# Patient Record
Sex: Female | Born: 1990
Health system: Southern US, Community
[De-identification: ages and names within clinical notes are randomized; demographics above are authoritative.]

## PROBLEM LIST (undated history)

## (undated) ENCOUNTER — Inpatient Hospital Stay (HOSPITAL_COMMUNITY): Payer: Self-pay

## (undated) DIAGNOSIS — A5901 Trichomonal vulvovaginitis: Secondary | ICD-10-CM

## (undated) DIAGNOSIS — O23599 Infection of other part of genital tract in pregnancy, unspecified trimester: Secondary | ICD-10-CM

## (undated) DIAGNOSIS — D649 Anemia, unspecified: Secondary | ICD-10-CM

## (undated) HISTORY — PX: WISDOM TOOTH EXTRACTION: SHX21

## (undated) HISTORY — PX: NO PAST SURGERIES: SHX2092

---

## 2012-01-06 ENCOUNTER — Encounter: Payer: Self-pay | Admitting: Family

## 2012-01-06 ENCOUNTER — Ambulatory Visit (INDEPENDENT_AMBULATORY_CARE_PROVIDER_SITE_OTHER): Payer: 59 | Admitting: Family

## 2012-01-06 ENCOUNTER — Other Ambulatory Visit (HOSPITAL_COMMUNITY)
Admission: RE | Admit: 2012-01-06 | Discharge: 2012-01-06 | Disposition: A | Discharge status: Home / Self Care | Payer: 59 | Source: Ambulatory Visit | Attending: Family | Admitting: Family

## 2012-01-06 VITALS — Ht 62.75 in | Wt 117.0 lb

## 2012-01-06 DIAGNOSIS — N920 Excessive and frequent menstruation with regular cycle: Secondary | ICD-10-CM

## 2012-01-06 DIAGNOSIS — Z3009 Encounter for other general counseling and advice on contraception: Secondary | ICD-10-CM

## 2012-01-06 DIAGNOSIS — Z01419 Encounter for gynecological examination (general) (routine) without abnormal findings: Secondary | ICD-10-CM | POA: Insufficient documentation

## 2012-01-06 DIAGNOSIS — Z124 Encounter for screening for malignant neoplasm of cervix: Secondary | ICD-10-CM

## 2012-01-06 LAB — CBC
HCT: 33.7 % — ABNORMAL LOW (ref 36.0–46.0)
Hemoglobin: 11 g/dL — ABNORMAL LOW (ref 12.0–15.0)
RBC: 4.3 Mil/uL (ref 3.87–5.11)
WBC: 5 10*3/uL (ref 4.5–10.5)

## 2012-01-06 LAB — POCT URINE PREGNANCY: Preg Test, Ur: NEGATIVE

## 2012-01-06 LAB — TSH: TSH: 1.54 u[IU]/mL (ref 0.35–5.50)

## 2012-01-06 MED ORDER — LEVONORGESTREL-ETHINYL ESTRAD 0.15-30 MG-MCG PO TABS: 1.0000 | ORAL_TABLET | Freq: Every day | ORAL | Status: DC

## 2012-01-06 NOTE — Patient Instructions (Addendum)
Oral Contraception Information Oral contraceptives (OCs) are medicines taken to prevent pregnancy. OCs work by preventing the ovaries from releasing eggs. The hormones in OCs also cause the cervical mucus to thicken, preventing the sperm from entering the uterus. The hormones also cause the uterine lining to become thin, not allowing a fertilized egg to attach to the inside of the uterus. OCs are highly effective when taken exactly as prescribed. However, OCs do not prevent sexually transmitted diseases (STDs). Safe sex practices, such as using condoms along with the pill, can help prevent STDs.  Before taking the pill, you may have a physical exam and Pap test. Your caregiver may order blood tests that may be necessary. Your caregiver will make sure you are a good candidate for oral contraception. Discuss with your caregiver the possible side effects of the OC you may be prescribed. When starting an OC, it can take 2 to 3 months for the body to adjust to the changes in hormone levels in your body.  TYPES OF ORAL CONTRACEPTION  The combination pill. This pill contains estrogen and progestin (synthetic progesterone) hormones. The combination pill comes in either 21-day or 28-day packs. With 21-day packs, you do not take pills for 7 days after the last pill. With 28-day packs, the pill is taken every day. The last 7 pills are without hormones. Certain types of pills have more than 21 hormone-containing pills.   The minipill. This pill contains the progesterone hormone only. It is taken every day continuously. The minipill comes in packs of 91 pills. The first 84 pills contain the hormones, and the last 7 pills do not. The last 7 days are when you will have your menstrual period. You may experience irregular spotting.  ADVANTAGES  Decreases premenstrual symptoms.   Treats menstrual period cramps.   Regulates the menstrual cycle.   Decreases a heavy menstrual flow.   Treats acne.   Treats abnormal  uterine bleeding.   Treats chronic pelvic pain.   Treats polycystic ovarian syndrome.   Treats endometriosis.   Can be used as emergency contraception.  DISADVANTAGES OCs can be less effective if:  You forget to take the pill at the same time every day.   You have a stomach or intestinal disease that lessens the absorption of the pill.   You take OCs with other medicines that make OCs less effective.   You take expired OCs.   You forget to restart the pill on day 7, when using the packs of 21 pills.  Document Released: 12/26/2002 Document Revised: 09/24/2011 Document Reviewed: 02/11/2011 [facility] Patient Information 2012 [place], [place].  Menorrhagia Dysfunctional uterine bleeding is different from a normal menstrual period. When periods are heavy or there is more bleeding than is usual for you, it is called menorrhagia. It may be caused by hormonal imbalance, or physical, metabolic, or other problems. Examination is necessary in order that your caregiver may treat treatable causes. If this is a continuing problem, a D&C may be needed. That means that the cervix (the opening of the uterus or womb) is dilated (stretched larger) and the lining of the uterus is scraped out. The tissue scraped out is then examined under a microscope by a specialist (pathologist) to make sure there is nothing of concern that needs further or more extensive treatment. HOME CARE INSTRUCTIONS   If medications were prescribed, take exactly as directed. Do not change or switch medications without consulting your caregiver.   Long term heavy bleeding may result in iron  deficiency. Your caregiver may have prescribed iron pills. They help replace the iron your body lost from heavy bleeding. Take exactly as directed. Iron may cause constipation. If this becomes a problem, increase the bran, fruits, and roughage in your diet.   Do not take aspirin or medicines that contain aspirin one week before or during your  menstrual period. Aspirin may make the bleeding worse.   If you need to change your sanitary pad or tampon more than once every 2 hours, stay in bed and rest as much as possible until the bleeding stops.   Eat well-balanced meals. Eat foods high in iron. Examples are leafy green vegetables, meat, liver, eggs, and whole grain breads and cereals. Do not try to lose weight until the abnormal bleeding has stopped and your blood iron level is back to normal.  SEEK MEDICAL CARE IF:   You need to change your sanitary pad or tampon more than once an hour.   You develop nausea (feeling sick to your stomach) and vomiting, dizziness, or diarrhea while you are taking your medicine.   You have any problems that may be related to the medicine you are taking.  SEEK IMMEDIATE MEDICAL CARE IF:   You have a fever.   You develop chills.   You develop severe bleeding or start to pass blood clots.   You feel dizzy or faint.  MAKE SURE YOU:   Understand these instructions.   Will watch your condition.   Will get help right away if you are not doing well or get worse.  Document Released: 10/05/2005 Document Revised: 09/24/2011 Document Reviewed: 05/25/2008 [facility] Patient Information 2012 [place], [place].

## 2012-01-06 NOTE — Progress Notes (Signed)
  Subjective:    Patient ID: Courtney [name], female    DOB: July 28, 1991, 21 y.o.   MRN: [id number]  HPI 21 year old [place] American female, new patient to the practice is in to discuss birth control options. She has concerns of having a menstrual cycle recently. Her menstrual cycle lasts about 7 days, she usually uses 2 super plus absorbency pads. Does report clotting. She is sexually active with one female partner. Unprotected. Denies any concern of infection transmitted diseases. Is not sure of pregnancy. Patient denies any vaginal discharge for over, frequency urgency to urinate, abdominal pain or back pain. She has never had a Pap smear.   Review of Systems  Constitutional: Negative.   Respiratory: Negative.   Cardiovascular: Negative.   Gastrointestinal: Negative.   Genitourinary: Positive for vaginal bleeding. Negative for dysuria, vaginal discharge and vaginal pain.  Musculoskeletal: Negative.   Skin: Negative.   Psychiatric/Behavioral: Negative.    Past Medical History  Diagnosis Date  . Migraine     History   Social History  . Marital Status: Unknown    Spouse Name: N/A    Number of Children: N/A  . Years of Education: N/A   Occupational History  . Not on file.   Social History Main Topics  . Smoking status: Never Smoker   . Smokeless tobacco: Not on file  . Alcohol Use: No  . Drug Use: No  . Sexually Active: Not on file   Other Topics Concern  . Not on file   Social History Narrative  . No narrative on file    No past surgical history on file.  Family History  Problem Relation Age of Onset  . Diabetes Mother   . Kidney disease Father   . Diabetes Maternal Uncle   . Diabetes Maternal Grandmother   . Cancer Paternal Grandfather     lung    No Known Allergies  No current outpatient prescriptions on file prior to visit.    Ht 5' 2.75" (1.594 m)  Wt 117 lb (53.071 kg)  BMI 20.89 kg/m2  LMP 03/15/2013chart    Objective:   Physical Exam    Constitutional: She is oriented to person, place, and time. She appears well-developed and well-nourished.  Neck: Normal range of motion. Neck supple. No thyromegaly present.  Cardiovascular: Normal rate, regular rhythm and normal heart sounds.   Pulmonary/Chest: Effort normal and breath sounds normal.  Abdominal: Soft.  Genitourinary: Vagina normal and uterus normal.       Patient is on her menses., Vaginal smell. No cervical motion tenderness. No adnexal tenderness.  Musculoskeletal: Normal range of motion.  Neurological: She is alert and oriented to person, place, and time.  Skin: Skin is warm and dry.  Psychiatric: She has a normal mood and affect.          Assessment & Plan:  Assessment: Menorrhagia, Pap smear, counseling of birth control  Plan: Pap smear sent. Will start Nordette oral contraceptive pills once daily. Labs sent to include CBC and TSH. Encouraged healthy diet and exercise. Safe sex practices. We'll follow the patient on the results of her labs, in one year, and when necessary sooner.

## 2012-10-04 ENCOUNTER — Encounter: Payer: Self-pay | Admitting: Family

## 2012-10-04 ENCOUNTER — Ambulatory Visit (INDEPENDENT_AMBULATORY_CARE_PROVIDER_SITE_OTHER): Payer: 59 | Admitting: Family

## 2012-10-04 VITALS — BP 132/100 | HR 108 | Temp 98.3°F | Wt 120.0 lb

## 2012-10-04 DIAGNOSIS — IMO0001 Reserved for inherently not codable concepts without codable children: Secondary | ICD-10-CM

## 2012-10-04 DIAGNOSIS — R030 Elevated blood-pressure reading, without diagnosis of hypertension: Secondary | ICD-10-CM

## 2012-10-04 DIAGNOSIS — R51 Headache: Secondary | ICD-10-CM

## 2012-10-04 MED ORDER — AMITRIPTYLINE HCL 10 MG PO TABS: 10.0000 mg | ORAL_TABLET | Freq: Every day | ORAL | Status: DC

## 2012-10-04 NOTE — Patient Instructions (Addendum)

## 2012-10-04 NOTE — Progress Notes (Signed)
Subjective:    Patient ID: Courtney [name], female    DOB: [birth year], 21 y.o.   MRN: [id number]  HPI 21 year old [place] American female, no nonsmoker is in with complaints of headaches that are occurring nearly every day x1 month. They respond well to ibuprofen. Headaches are typically at the crown of her hay it, described as dull ache. She reports an increase in stress related to working in school. She is also planning a wedding. Patient has a history of headaches in the past dating back to high school. But has not had any recent headaches. Patient also started birth control 4 months ago. Denies any nausea, vomiting, blurred vision, double vision. She's also concerned that her blood pressure is elevated today. She has a family history of hypertension and her father and paternal grandmother.  Review of Systems  Constitutional: Negative.   Respiratory: Negative.   Cardiovascular: Negative.   Gastrointestinal: Negative.  Negative for nausea and vomiting.  Musculoskeletal: Negative.   Skin: Negative.   Neurological: Positive for headaches. Negative for light-headedness.  Hematological: Negative.   Psychiatric/Behavioral: Negative.    Past Medical History  Diagnosis Date  . Migraine     History   Social History  . Marital Status: Unknown    Spouse Name: N/A    Number of Children: N/A  . Years of Education: N/A   Occupational History  . Not on file.   Social History Main Topics  . Smoking status: Never Smoker   . Smokeless tobacco: Not on file  . Alcohol Use: No  . Drug Use: No  . Sexually Active: Not on file   Other Topics Concern  . Not on file   Social History Narrative  . No narrative on file    No past surgical history on file.  Family History  Problem Relation Age of Onset  . Diabetes Mother   . Kidney disease Father   . Diabetes Maternal Uncle   . Diabetes Maternal Grandmother   . Cancer Paternal Grandfather     lung    No Known  Allergies  Current Outpatient Prescriptions on File Prior to Visit  Medication Sig Dispense Refill  . levonorgestrel-ethinyl estradiol (NORDETTE) 0.15-30 MG-MCG tablet Take 1 tablet by mouth daily.  1 Package  11  . amitriptyline (ELAVIL) 10 MG tablet Take 1 tablet (10 mg total) by mouth at bedtime.  30 tablet  1    BP 132/100  Pulse 108  Temp 98.3 F (36.8 C) (Oral)  Wt 120 lb (54.432 kg)  SpO2 99%chart    Objective:   Physical Exam  Constitutional: She is oriented to person, place, and time. She appears well-developed and well-nourished.  HENT:  Right Ear: External ear normal.  Left Ear: External ear normal.  Nose: Nose normal.  Mouth/Throat: Oropharynx is clear and moist.  Neck: Normal range of motion. Neck supple.  Cardiovascular: Normal rate, regular rhythm and normal heart sounds.   Pulmonary/Chest: Effort normal and breath sounds normal.  Abdominal: Soft. Bowel sounds are normal.  Musculoskeletal: Normal range of motion.  Neurological: She is alert and oriented to person, place, and time.  Skin: Skin is warm and dry.  Psychiatric: She has a normal mood and affect.          Assessment & Plan:  Assessment: Headache, elevated blood pressure, acute stress reaction  Plan: Start amitriptyline 10 mg once daily at bedtime to help target stress and headaches. Monitor blood pressure at home and bring readings in to her  next office visit. Patient call the office if symptoms worsen or persist. Recheck as scheduled and as needed.

## 2012-10-07 ENCOUNTER — Telehealth: Payer: Self-pay | Admitting: Family
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
## 2012-12-07 ENCOUNTER — Other Ambulatory Visit: Payer: Self-pay

## 2012-12-07 MED ORDER — LEVONORGESTREL-ETHINYL ESTRAD 0.15-30 MG-MCG PO TABS: 1.0000 | ORAL_TABLET | Freq: Every day | ORAL | Status: DC

## 2013-06-01 ENCOUNTER — Encounter (HOSPITAL_COMMUNITY): Payer: Self-pay | Admitting: *Deleted

## 2013-06-01 ENCOUNTER — Inpatient Hospital Stay (HOSPITAL_COMMUNITY)
Admission: AD | Admit: 2013-06-01 | Discharge: 2013-06-01 | Disposition: A | Discharge status: Home / Self Care | Payer: 59 | Source: Ambulatory Visit | Attending: Family Medicine | Admitting: Family Medicine

## 2013-06-01 DIAGNOSIS — N926 Irregular menstruation, unspecified: Secondary | ICD-10-CM

## 2013-06-01 DIAGNOSIS — IMO0001 Reserved for inherently not codable concepts without codable children: Secondary | ICD-10-CM

## 2013-06-01 DIAGNOSIS — R030 Elevated blood-pressure reading, without diagnosis of hypertension: Secondary | ICD-10-CM | POA: Insufficient documentation

## 2013-06-01 DIAGNOSIS — N912 Amenorrhea, unspecified: Secondary | ICD-10-CM | POA: Insufficient documentation

## 2013-06-01 DIAGNOSIS — Z3202 Encounter for pregnancy test, result negative: Secondary | ICD-10-CM

## 2013-06-01 LAB — POCT PREGNANCY, URINE: Preg Test, Ur: NEGATIVE

## 2013-06-01 LAB — URINALYSIS, ROUTINE W REFLEX MICROSCOPIC
Bilirubin Urine: NEGATIVE
Leukocytes, UA: NEGATIVE
Nitrite: NEGATIVE
Specific Gravity, Urine: >1.03 — ABNORMAL HIGH (ref 1.005–1.030)
pH: 6 (ref 5.0–8.0)

## 2013-06-01 NOTE — MAU Note (Signed)
Stopped BC pills last month July 14th; LNMP was July 9th -13th; was suppose to start her period this month on the 6th, but hasn't started yet; had unprotected sex;

## 2013-06-01 NOTE — MAU Provider Note (Signed)
Chart reviewed and agree with management and plan.  

## 2013-06-01 NOTE — MAU Provider Note (Signed)
History     CSN: [id number]  Arrival date and time: 06/01/13 1702   None     Chief Complaint  Patient presents with  . Possible Pregnancy   HPI Courtney [name] is 22 y.o. No obstetric history on file. Unknown weeks presenting with with report of a late menstrual cycle.  She reports she d/c her OCPs 7/14 and had an expected,normal cycle from 7/9-7/14.  She has had unprotected intercourse.  She states she had a sharp pain at the time she discontinued OCPS, none since.  She also states that her PCP has been following her blood pressures which have elevated and instructed her to discontinue her OCPs at the end of that pack, which she did.  She denies headache, visual changes, swelling, vaginal bleeding or discharge or abdominal pain.     Past Medical History  Diagnosis Date  . Migraine     No past surgical history on file.  Family History  Problem Relation Age of Onset  . Diabetes Mother   . Kidney disease Father   . Diabetes Maternal Uncle   . Diabetes Maternal Grandmother   . Cancer Paternal Grandfather     lung    History  Substance Use Topics  . Smoking status: Never Smoker   . Smokeless tobacco: Not on file  . Alcohol Use: No    Allergies: No Known Allergies  Prescriptions prior to admission  Medication Sig Dispense Refill  . amitriptyline (ELAVIL) 10 MG tablet Take 1 tablet (10 mg total) by mouth at bedtime.  30 tablet  1  . levonorgestrel-ethinyl estradiol (NORDETTE) 0.15-30 MG-MCG tablet Take 1 tablet by mouth daily.  1 Package  11    Review of Systems  Constitutional: Negative for fever and chills.  Eyes: Negative for blurred vision and double vision.  Cardiovascular: Negative for chest pain.  Gastrointestinal: Negative for abdominal pain.  Genitourinary: Negative for dysuria, urgency and frequency.       Neg for vaginal bleeding or discharge  Neurological: Negative for headaches.   Physical Exam   Blood pressure 161/93, pulse 104, temperature  98.7 F (37.1 C), temperature source Oral, resp. rate 16, height 5\' 2"  (1.575 m), weight 117 lb (53.071 kg).  Physical Exam  Nursing note and vitals reviewed. Constitutional: She is oriented to person, place, and time. She appears well-developed and well-nourished. No distress.  HENT:  Head: Normocephalic.  Respiratory: Effort normal.  Genitourinary:  Not indicated  Neurological: She is alert and oriented to person, place, and time.  Psychiatric: She has a normal mood and affect. Her behavior is normal.      Patient Vitals for the past 24 hrs:  BP Temp Temp src Pulse Resp Height Weight  06/01/13 1807 138/84 mmHg - - 98 - - -  06/01/13 1723 161/93 mmHg 98.7 F (37.1 C) Oral 104 16 5\' 2"  (1.575 m) 117 lb (53.071 kg)   MAU Course  Procedures  MDM Discussed with the patient and her Mother, that delay in menstrual cycle is not uncommon first month off pills.  She is now a week late, had negative UPT here.  Explained that I am concerned about her blood pressure reading.  Without any sxs associated with elevated blood pressure and the fact that the last reading was WNL, she may be discharge to home with follow up with her PCP tomorrow.  She was instructed to use condoms until another form of contraception has been chosen.  Assessment and Plan  A:  Missed  cycle      Negative pregnancy test      Elevated blood pressure  P:Follow up with PCP tomorrow for re-evaluation of blood pressure    Condoms until another method is chose        Shalen Petrak,EVE M 06/01/2013, 5:25 PM

## 2013-09-25 ENCOUNTER — Encounter: Payer: Self-pay | Admitting: Family

## 2013-09-25 ENCOUNTER — Ambulatory Visit (INDEPENDENT_AMBULATORY_CARE_PROVIDER_SITE_OTHER): Payer: 59 | Admitting: Family

## 2013-09-25 VITALS — BP 124/88 | HR 87 | Wt 121.0 lb

## 2013-09-25 DIAGNOSIS — R8290 Unspecified abnormal findings in urine: Secondary | ICD-10-CM

## 2013-09-25 DIAGNOSIS — R82998 Other abnormal findings in urine: Secondary | ICD-10-CM

## 2013-09-25 DIAGNOSIS — N912 Amenorrhea, unspecified: Secondary | ICD-10-CM

## 2013-09-25 LAB — POCT URINALYSIS DIPSTICK
Leukocytes, UA: NEGATIVE
Nitrite, UA: NEGATIVE
Protein, UA: NEGATIVE
Urobilinogen, UA: NEGATIVE

## 2013-09-25 LAB — POCT URINE PREGNANCY: Preg Test, Ur: NEGATIVE

## 2013-09-25 LAB — CBC WITH DIFFERENTIAL/PLATELET
Basophils Absolute: 0 10*3/uL (ref 0.0–0.1)
Eosinophils Absolute: 0.1 10*3/uL (ref 0.0–0.7)
Lymphocytes Relative: 28.8 % (ref 12.0–46.0)
MCHC: 32.7 g/dL (ref 30.0–36.0)
Monocytes Absolute: 0.4 10*3/uL (ref 0.1–1.0)
Neutro Abs: 2.9 10*3/uL (ref 1.4–7.7)
Neutrophils Relative %: 58.5 % (ref 43.0–77.0)
RDW: 20.2 % — ABNORMAL HIGH (ref 11.5–14.6)

## 2013-09-25 NOTE — Patient Instructions (Signed)
Primary Amenorrhea  Primary amenorrhea is the absence of any menstrual flow in a woman by the age of sixteen. An average age for the start of menstruation is age twelve. Primary amenorrhea is not considered to have occurred until a girl is over age 21 and has never menstruated. This may occur with or without other signs of puberty. CAUSES  Some common causes of not menstruating include:  Malnutrition.  Low blood sugar (hypoglycemia).  Polycystic ovarian disease (cysts in the ovaries, not ovulating).  Absence of the vagina, uterus, or ovaries since birth (congenital).  Extreme obesity.  Cystic fibrosis.  Drastic weight loss from any cause.  Over-exercising (running, biking) causing loss of body fat.  Pituitary gland tumor in the brain.  Long-term (chronic) illnesses.  Cushing's disease.  Thyroid disease (hypothyroidism, hyperthyroidism).  Part of the brain (hypothalamus) not functioning.  Premature ovarian failure. DIAGNOSIS  This diagnosis is made by doing a medical history and physical exam. Often, numerous blood tests of different hormones in the body may be done, along with some urine testing. Specialized x-rays may need to be done, as well as measuring the Body Mass Index (BMI). If your BMI is less than 20, the hypothalamus may be the problem. If it is greater than 30, the cause may be diabetes mellitus. Pregnancy must be ruled out. TREATMENT  Treatment depends on the cause of the amenorrhea. If an eating disorder is present, this can be treated with an adequate diet and therapy. Chronic illnesses may improve with treatment of the illness. Overall, the outlook is good, and amenorrhea may be corrected with medicines, lifestyle changes, or surgery. If the amenorrhea can not be corrected, it is sometimes possible to create a false menstruation with medicines, to help young women feel more normal. Should emotional problems occur, your caregiver will be able to help you.  Document  Released: 10/05/2005 Document Revised: 12/28/2011 Document Reviewed: 05/23/2008 [employer] Patient Information 2014 [employer].  

## 2013-09-25 NOTE — Progress Notes (Signed)
Pre visit review using our clinic review tool, if applicable. No additional management support is needed unless otherwise documented below in the visit note. 

## 2013-09-25 NOTE — Progress Notes (Signed)
   Subjective:    Patient ID: Courtney [name], female    DOB: 09/27/1991, 22 y.o.   MRN: [id number]  HPI  22 year old [place] female, nonsmoker presenting with c/o no menstrual cycle since October 29.  Reports she typically has a 29 day cycle. She has increased stress with an upcoming wedding in 3 months. She is sexually active.  She stopped taking birth control in August. Complains of increased anxiety due to [place] in March.    Review of Systems  Constitutional: Negative.   Respiratory: Negative.   Cardiovascular: Negative.   Gastrointestinal: Negative.   Endocrine: Negative.   Genitourinary: Positive for menstrual problem. Negative for vaginal bleeding, vaginal discharge and vaginal pain.       Patient complains of no menstrual cycle since October 29.  Musculoskeletal: Negative.   Skin: Negative.   Neurological: Negative.   Hematological: Negative.   Psychiatric/Behavioral: Negative.   All other systems reviewed and are negative.   Past Medical History  Diagnosis Date  . Migraine   . Hypertension     History   Social History  . Marital Status: Single    Spouse Name: N/A    Number of Children: N/A  . Years of Education: N/A   Occupational History  . Not on file.   Social History Main Topics  . Smoking status: Never Smoker   . Smokeless tobacco: Not on file  . Alcohol Use: No  . Drug Use: No  . Sexual Activity: Not on file   Other Topics Concern  . Not on file   Social History Narrative  . No narrative on file    History reviewed. No pertinent past surgical history.  Family History  Problem Relation Age of Onset  . Diabetes Mother   . Kidney disease Father   . Diabetes Maternal Uncle   . Diabetes Maternal Grandmother   . Cancer Paternal Grandfather     lung    No Known Allergies  No current outpatient prescriptions on file prior to visit.   No current facility-administered medications on file prior to visit.    BP 124/88  Pulse 87  Wt 121  lb (54.885 kg)chart    Objective:   Physical Exam  Constitutional: She is oriented to person, place, and time. She appears well-developed and well-nourished.  Neck: Normal range of motion. Neck supple.  Cardiovascular: Normal rate, regular rhythm and normal heart sounds.   Pulmonary/Chest: Effort normal and breath sounds normal.  Abdominal: Soft. Bowel sounds are normal.  Musculoskeletal: Normal range of motion.  Neurological: She is alert and oriented to person, place, and time.  Skin: Skin is warm and dry.  Psychiatric: She has a normal mood and affect.   Urine pregnancy test obtained with negative results.       Assessment & Plan:  Assessment: 1. Amenorrhea  Plan: Check TSH and serum HCG.   Call office if problems persist or worsen. We'll followup pending the results of her labs and sooner as needed.

## 2013-11-21 ENCOUNTER — Encounter (HOSPITAL_COMMUNITY): Payer: Self-pay | Admitting: Emergency Medicine

## 2013-11-21 DIAGNOSIS — R51 Headache: Secondary | ICD-10-CM | POA: Insufficient documentation

## 2013-11-21 DIAGNOSIS — Z3202 Encounter for pregnancy test, result negative: Secondary | ICD-10-CM | POA: Insufficient documentation

## 2013-11-21 DIAGNOSIS — H538 Other visual disturbances: Secondary | ICD-10-CM | POA: Insufficient documentation

## 2013-11-21 DIAGNOSIS — I10 Essential (primary) hypertension: Secondary | ICD-10-CM | POA: Insufficient documentation

## 2013-11-21 NOTE — ED Notes (Signed)
Pt. reports hypertension this evening at home 134/101 with slight blurred vision .Respirations unlabored . Denies pain .

## 2013-11-22 ENCOUNTER — Emergency Department (HOSPITAL_COMMUNITY)
Admission: EM | Admit: 2013-11-22 | Discharge: 2013-11-22 | Disposition: A | Discharge status: Home / Self Care | Payer: Medicaid Other | Attending: Emergency Medicine | Admitting: Emergency Medicine

## 2013-11-22 DIAGNOSIS — I10 Essential (primary) hypertension: Secondary | ICD-10-CM

## 2013-11-22 LAB — POCT PREGNANCY, URINE: Preg Test, Ur: NEGATIVE

## 2013-11-22 MED ORDER — IBUPROFEN 400 MG PO TABS: 400.0000 mg | ORAL_TABLET | Freq: Four times a day (QID) | ORAL | Status: DC | PRN

## 2013-11-22 MED ORDER — HYDROCHLOROTHIAZIDE 12.5 MG PO TABS: 12.5000 mg | ORAL_TABLET | Freq: Every day | ORAL | Status: DC

## 2013-11-22 NOTE — ED Provider Notes (Signed)
CSN: [id number]     Arrival date & time 11/21/13  2328 History   First MD Initiated Contact with Patient 11/22/13 0002     Chief Complaint  Patient presents with  . Hypertension   (Consider location/radiation/quality/duration/timing/severity/associated sxs/prior Treatment) HPI Comments: 23 y/o F comes in with cc of HTN. Pt has no forma dx of HTN, and is not on any meds. She however, has had headaches, and blurry vision in the past - which has correlated to elevated blood pressure - and so today, when she had some blurry vision, she checked her BP, it was 130/100 - and she came to the ER for further evaluation. The blurry vision is on the left eye, and it has nearly resolved. 1 prior episode. No associated headaches, numbness, tingling, weakness, gait instability. Pt denies smoking increased stimulant use, substance abuse. She is planning her wedding, and is in a bit more stress than usual. No fam hx of MS, early strokes.  Patient is a 23 y.o. female presenting with hypertension. The history is provided by the patient.  Hypertension Pertinent negatives include no chest pain, no abdominal pain, no headaches and no shortness of breath.    Past Medical History  Diagnosis Date  . Migraine   . Hypertension    History reviewed. No pertinent past surgical history. Family History  Problem Relation Age of Onset  . Diabetes Mother   . Kidney disease Father   . Diabetes Maternal Uncle   . Diabetes Maternal Grandmother   . Cancer Paternal Grandfather     lung   History  Substance Use Topics  . Smoking status: Never Smoker   . Smokeless tobacco: Not on file  . Alcohol Use: No   OB History   Grav Para Term Preterm Abortions TAB SAB Ect Mult Living   0              Review of Systems  Constitutional: Negative for activity change.  Eyes: Positive for visual disturbance.  Respiratory: Negative for shortness of breath.   Cardiovascular: Negative for chest pain.  Gastrointestinal: Negative  for nausea and abdominal pain.  Neurological: Negative for weakness, light-headedness, numbness and headaches.    Allergies  Review of patient's allergies indicates no known allergies.  Home Medications  No current outpatient prescriptions on file. BP 153/101  Pulse 95  Temp(Src) 97.7 F (36.5 C) (Oral)  Resp 16  Wt 124 lb 2 oz (56.303 kg)  SpO2 100% Physical Exam  Nursing note and vitals reviewed. Constitutional: She is oriented to person, place, and time. She appears well-developed and well-nourished.  HENT:  Head: Normocephalic and atraumatic.  Eyes: EOM are normal. Pupils are equal, round, and reactive to light.  Neck: Neck supple. No JVD present.  Cardiovascular: Normal rate, regular rhythm and normal heart sounds.   No murmur heard. Pulmonary/Chest: Effort normal. No respiratory distress.  Abdominal: Soft. She exhibits no distension. There is no tenderness. There is no rebound and no guarding.  Neurological: She is alert and oriented to person, place, and time.  Skin: Skin is warm and dry.    ED Course  Procedures (including critical care time) Labs Review Labs Reviewed  POCT PREGNANCY, URINE   Imaging Review No results found.  EKG Interpretation   None       MDM  No diagnosis found.  Pt comes in with cc of elevated BP. Her PCP is actually monitoring the BP, and patient states that she has had 1/2 elevated readings at the  clinic. Her home BP is nor concerning at all - however, triage BP is elevated. She is asymptomatic right now. Pt has no true risk factors for stroke, and we dint think this was a TIA. Low suspicion for MS as well - asked her to see an optometrist. Repeat BP ordered.  I think it will likely be more harmful than beneficial to start pt on antihypertensive from the ED. Will request continued pcp f/u - and i have asked her to have her BP checked randomly, after reting for few minutes to keep a log.  Derwood Kaplan, MD 11/22/13 [phone]

## 2013-11-22 NOTE — ED Notes (Signed)
Pt states that for the past several months she has been having episodes of Hypertensions was seen at a clinic several times and her BP was elevated. Pt states HA for the past 3 days and blurred vision but denies HA or blurred vision now. Pt was concerned because her BP has not been running this high for this long before. Pt alert and oriented no neuro deficits.

## 2013-11-22 NOTE — Discharge Instructions (Signed)
Arterial Hypertension °Arterial hypertension (high blood pressure) is a condition of elevated pressure in your blood vessels. Hypertension over a long period of time is a risk factor for strokes, heart attacks, and heart failure. It is also the leading cause of kidney (renal) failure.  °CAUSES  °· In Adults -- Over 90% of all hypertension has no known cause. This is called essential or primary hypertension. In the other 10% of people with hypertension, the increase in blood pressure is caused by another disorder. This is called secondary hypertension. Important causes of secondary hypertension are: °· Heavy alcohol use. °· Obstructive sleep apnea. °· Hyperaldosterosim (Conn's syndrome). °· Steroid use. °· Chronic kidney failure. °· Hyperparathyroidism. °· Medications. °· Renal artery stenosis. °· Pheochromocytoma. °· Cushing's disease. °· Coarctation of the aorta. °· Scleroderma renal crisis. °· Licorice (in excessive amounts). °· Drugs (cocaine, methamphetamine). °Your caregiver can explain any items above that apply to you. °· In Children -- Secondary hypertension is more common and should always be considered. °· Pregnancy -- Few women of childbearing age have high blood pressure. However, up to 10% of them develop hypertension of pregnancy. Generally, this will not harm the woman. It may be a sign of 3 complications of pregnancy: preeclampsia, HELLP syndrome, and eclampsia. Follow up and control with medication is necessary. °SYMPTOMS  °· This condition normally does not produce any noticeable symptoms. It is usually found during a routine exam. °· Malignant hypertension is a late problem of high blood pressure. It may have the following symptoms: °· Headaches. °· Blurred vision. °· End-organ damage (this means your kidneys, heart, lungs, and other organs are being damaged). °· Stressful situations can increase the blood pressure. If a person with normal blood pressure has their blood pressure go up while being  seen by their caregiver, this is often termed "white coat hypertension." Its importance is not known. It may be related with eventually developing hypertension or complications of hypertension. °· Hypertension is often confused with mental tension, stress, and anxiety. °DIAGNOSIS  °The diagnosis is made by 3 separate blood pressure measurements. They are taken at least 1 week apart from each other. If there is organ damage from hypertension, the diagnosis may be made without repeat measurements. °Hypertension is usually identified by having blood pressure readings: °· Above 140/90 mmHg measured in both arms, at 3 separate times, over a couple weeks. °· Over 130/80 mmHg should be considered a risk factor and may require treatment in patients with diabetes. °Blood pressure readings over 120/80 mmHg are called "pre-hypertension" even in non-diabetic patients. °To get a true blood pressure measurement, use the following guidelines. Be aware of the factors that can alter blood pressure readings. °· Take measurements at least 1 hour after caffeine. °· Take measurements 30 minutes after smoking and without any stress. This is another reason to quit smoking  it raises your blood pressure. °· Use a proper cuff size. Ask your caregiver if you are not sure about your cuff size. °· Most home blood pressure cuffs are automatic. They will measure systolic and diastolic pressures. The systolic pressure is the pressure reading at the start of sounds. Diastolic pressure is the pressure at which the sounds disappear. If you are elderly, measure pressures in multiple postures. Try sitting, lying or standing. °· Sit at rest for a minimum of 5 minutes before taking measurements. °· You should not be on any medications like decongestants. These are found in many cold medications. °· Record your blood pressure readings and review   them with your caregiver. °If you have hypertension: °· Your caregiver may do tests to be sure you do not have  secondary hypertension (see "causes" above). °· Your caregiver may also look for signs of metabolic syndrome. This is also called Syndrome X or Insulin Resistance Syndrome. You may have this syndrome if you have type 2 diabetes, abdominal obesity, and abnormal blood lipids in addition to hypertension. °· Your caregiver will take your medical and family history and perform a physical exam. °· Diagnostic tests may include blood tests (for glucose, cholesterol, potassium, and kidney function), a urinalysis, or an EKG. Other tests may also be necessary depending on your condition. °PREVENTION  °There are important lifestyle issues that you can adopt to reduce your chance of developing hypertension: °· Maintain a normal weight. °· Limit the amount of salt (sodium) in your diet. °· Exercise often. °· Limit alcohol intake. °· Get enough potassium in your diet. Discuss specific advice with your caregiver. °· Follow a DASH diet (dietary approaches to stop hypertension). This diet is rich in fruits, vegetables, and low-fat dairy products, and avoids certain fats. °PROGNOSIS  °Essential hypertension cannot be cured. Lifestyle changes and medical treatment can lower blood pressure and reduce complications. The prognosis of secondary hypertension depends on the underlying cause. Many people whose hypertension is controlled with medicine or lifestyle changes can live a normal, healthy life.  °RISKS AND COMPLICATIONS  °While high blood pressure alone is not an illness, it often requires treatment due to its short- and long-term effects on many organs. Hypertension increases your risk for: °· CVAs or strokes (cerebrovascular accident). °· Heart failure due to chronically high blood pressure (hypertensive cardiomyopathy). °· Heart attack (myocardial infarction). °· Damage to the retina (hypertensive retinopathy). °· Kidney failure (hypertensive nephropathy). °Your caregiver can explain list items above that apply to you. Treatment  of hypertension can significantly reduce the risk of complications. °TREATMENT  °· For overweight patients, weight loss and regular exercise are recommended. Physical fitness lowers blood pressure. °· Mild hypertension is usually treated with diet and exercise. A diet rich in fruits and vegetables, fat-free dairy products, and foods low in fat and salt (sodium) can help lower blood pressure. Decreasing salt intake decreases blood pressure in a 1/3 of people. °· Stop smoking if you are a smoker. °The steps above are highly effective in reducing blood pressure. While these actions are easy to suggest, they are difficult to achieve. Most patients with moderate or severe hypertension end up requiring medications to bring their blood pressure down to a normal level. There are several classes of medications for treatment. Blood pressure pills (antihypertensives) will lower blood pressure by their different actions. Lowering the blood pressure by 10 mmHg may decrease the risk of complications by as much as 25%. °The goal of treatment is effective blood pressure control. This will reduce your risk for complications. Your caregiver will help you determine the best treatment for you according to your lifestyle. What is excellent treatment for one person, may not be for you. °HOME CARE INSTRUCTIONS  °· Do not smoke. °· Follow the lifestyle changes outlined in the "Prevention" section. °· If you are on medications, follow the directions carefully. Blood pressure medications must be taken as prescribed. Skipping doses reduces their benefit. It also puts you at risk for problems. °· Follow up with your caregiver, as directed. °· If you are asked to monitor your blood pressure at home, follow the guidelines in the "Diagnosis" section above. °SEEK MEDICAL CARE   IF:   You think you are having medication side effects.  You have recurrent headaches or lightheadedness.  You have swelling in your ankles.  You have trouble with  your vision. SEEK IMMEDIATE MEDICAL CARE IF:   You have sudden onset of chest pain or pressure, difficulty breathing, or other symptoms of a heart attack.  You have a severe headache.  You have symptoms of a stroke (such as sudden weakness, difficulty speaking, difficulty walking). MAKE SURE YOU:   Understand these instructions.  Will watch your condition.  Will get help right away if you are not doing well or get worse. Document Released: 10/05/2005 Document Revised: 12/28/2011 Document Reviewed: 05/05/2007 [facility] Patient Information 2014 [place].  How to Take Your Blood Pressure  These instructions are only for electronic home blood pressure machines. You will need:   An automatic or semi-automatic blood pressure machine.  Fresh batteries for the blood pressure machine. HOW DO I USE THESE TOOLS TO CHECK MY BLOOD PRESSURE?   There are 2 numbers that make up your blood pressure. For example: 120/80.  The first number (120 in our example) is called the "systolic pressure." It is a measure of the pressure in your blood vessels when your heart is pumping blood.  The second number (80 in our example) is called the "diastolic pressure." It is a measure of the pressure in your blood vessels when your heart is resting between beats.  Before you buy a home blood pressure machine, check the size of your arm so you can buy the right size cuff. Here is how to check the size of your arm:  Use a tape measure that shows both inches and centimeters.  Wrap the tape measure around the middle upper part of your arm. You may need someone to help you measure right.  Write down your arm measurement in both inches and centimeters.  To measure your blood pressure right, it is important to have the right size cuff.  If your arm is up to 13 inches (37 to 34 centimeters), get an adult cuff size.  If your arm is 13 to 17 inches (35 to 44 centimeters), get a large adult cuff size.  If  your arm is 17 to 20 inches (45 to 52 centimeters), get an adult thigh cuff.  Try to rest or relax for at least 30 minutes before you check your blood pressure.  Do not smoke.  Do not have any drinks with caffeine, such as:  Pop.  Coffee.  Tea.  Check your blood pressure in a quiet room.  Sit down and stretch out your arm on a table. Keep your arm at about the level of your heart. Let your arm relax. GETTING BLOOD PRESSURE READINGS  Make sure you remove any tight-fighting clothing from your arm. Wrap the cuff around your upper arm. Wrap it just above the bend, and above where you felt the pulse. You should be able to slip a finger between the cuff and your arm. If you cannot slip a finger in the cuff, it is too tight and should be removed and rewrapped.  Some units requires you to manually pump up the arm cuff.  Automatic units inflate the cuff when you press a button.  Cuff deflation is automatic in both models.  After the cuff is inflated, the unit measures your blood pressure and pulse. The readings are displayed on a monitor. Hold still and breathe normally while the cuff is inflated.  Getting a  reading takes less than a minute.  Some models store readings in a memory. Some provide a printout of readings.  Get readings at different times of the day. You should wait at least 5 minutes between readings. Take readings with you to your next doctor's visit. Document Released: 09/17/2008 Document Revised: 12/28/2011 Document Reviewed: 09/17/2008 [facility] Patient Information 2014 [place], [place].  DASH Diet The DASH diet stands for "Dietary Approaches to Stop Hypertension." It is a healthy eating plan that has been shown to reduce high blood pressure (hypertension) in as little as 14 days, while also possibly providing other significant health benefits. These other health benefits include reducing the risk of breast cancer after menopause and reducing the risk of type 2 diabetes,  heart disease, colon cancer, and stroke. Health benefits also include weight loss and slowing kidney failure in patients with chronic kidney disease.  DIET GUIDELINES  Limit salt (sodium). Your diet should contain less than 1500 mg of sodium daily.  Limit refined or processed carbohydrates. Your diet should include mostly whole grains. Desserts and added sugars should be used sparingly.  Include small amounts of heart-healthy fats. These types of fats include nuts, oils, and tub margarine. Limit saturated and trans fats. These fats have been shown to be harmful in the body. CHOOSING FOODS  The following food groups are based on a 2000 calorie diet. See your Registered Dietitian for individual calorie needs. Grains and Grain Products (6 to 8 servings daily)  Eat More Often: Whole-wheat bread, brown rice, whole-grain or wheat pasta, quinoa, popcorn without added fat or salt (air popped).  Eat Less Often: White bread, white pasta, white rice, cornbread. Vegetables (4 to 5 servings daily)  Eat More Often: Fresh, frozen, and canned vegetables. Vegetables may be raw, steamed, roasted, or grilled with a minimal amount of fat.  Eat Less Often/Avoid: Creamed or fried vegetables. Vegetables in a cheese sauce. Fruit (4 to 5 servings daily)  Eat More Often: All fresh, canned (in natural juice), or frozen fruits. Dried fruits without added sugar. One hundred percent fruit juice ( cup [237 mL] daily).  Eat Less Often: Dried fruits with added sugar. Canned fruit in light or heavy syrup. Foot LockerLean Meats, Fish, and Poultry (2 servings or less daily. One serving is 3 to 4 oz [85-114 g]).  Eat More Often: Ninety percent or leaner ground beef, tenderloin, sirloin. Round cuts of beef, chicken breast, [place]key breast. All fish. Grill, bake, or broil your meat. Nothing should be fried.  Eat Less Often/Avoid: Fatty cuts of meat, [place]key, or chicken leg, thigh, or wing. Fried cuts of meat or fish. Dairy (2 to 3  servings)  Eat More Often: Low-fat or fat-free milk, low-fat plain or light yogurt, reduced-fat or part-skim cheese.  Eat Less Often/Avoid: Milk (whole, 2%).Whole milk yogurt. Full-fat cheeses. Nuts, Seeds, and Legumes (4 to 5 servings per week)  Eat More Often: All without added salt.  Eat Less Often/Avoid: Salted nuts and seeds, canned beans with added salt. Fats and Sweets (limited)  Eat More Often: Vegetable oils, tub margarines without trans fats, sugar-free gelatin. Mayonnaise and salad dressings.  Eat Less Often/Avoid: Coconut oils, palm oils, butter, stick margarine, cream, half and half, cookies, candy, pie. FOR MORE INFORMATION The Dash Diet Eating Plan: www.dashdiet.org Document Released: 09/24/2011 Document Revised: 12/28/2011 Document Reviewed: 09/24/2011 [facility] Patient Information 2014 [place], [place].

## 2013-12-20 ENCOUNTER — Other Ambulatory Visit (HOSPITAL_COMMUNITY)
Admission: RE | Admit: 2013-12-20 | Discharge: 2013-12-20 | Disposition: A | Discharge status: Home / Self Care | Payer: BC Managed Care – PPO | Source: Ambulatory Visit | Attending: Family | Admitting: Family

## 2013-12-20 ENCOUNTER — Telehealth: Payer: Self-pay | Admitting: Family

## 2013-12-20 ENCOUNTER — Encounter: Payer: Self-pay | Admitting: Family

## 2013-12-20 ENCOUNTER — Ambulatory Visit (INDEPENDENT_AMBULATORY_CARE_PROVIDER_SITE_OTHER): Payer: BC Managed Care – PPO | Admitting: Family

## 2013-12-20 VITALS — BP 110/92 | HR 91 | Ht 62.75 in | Wt 123.0 lb

## 2013-12-20 DIAGNOSIS — Z113 Encounter for screening for infections with a predominantly sexual mode of transmission: Secondary | ICD-10-CM | POA: Insufficient documentation

## 2013-12-20 DIAGNOSIS — Z124 Encounter for screening for malignant neoplasm of cervix: Secondary | ICD-10-CM

## 2013-12-20 DIAGNOSIS — Z01419 Encounter for gynecological examination (general) (routine) without abnormal findings: Secondary | ICD-10-CM | POA: Diagnosis present

## 2013-12-20 DIAGNOSIS — Z0000 Encounter for general adult medical examination without abnormal findings: Secondary | ICD-10-CM

## 2013-12-20 DIAGNOSIS — N760 Acute vaginitis: Secondary | ICD-10-CM | POA: Insufficient documentation

## 2013-12-20 DIAGNOSIS — N912 Amenorrhea, unspecified: Secondary | ICD-10-CM

## 2013-12-20 DIAGNOSIS — I10 Essential (primary) hypertension: Secondary | ICD-10-CM

## 2013-12-20 LAB — LIPID PANEL
Cholesterol: 188 mg/dL (ref 0–200)
HDL: 59.4 mg/dL (ref >39.00)
LDL CALC: 120 mg/dL — AB (ref 0–99)
Total CHOL/HDL Ratio: 3
Triglycerides: 43 mg/dL (ref 0.0–149.0)
VLDL: 8.6 mg/dL (ref 0.0–40.0)

## 2013-12-20 LAB — POCT URINALYSIS DIPSTICK
BILIRUBIN UA: NEGATIVE
Blood, UA: NEGATIVE
GLUCOSE UA: NEGATIVE
KETONES UA: NEGATIVE
Leukocytes, UA: NEGATIVE
Nitrite, UA: NEGATIVE
Protein, UA: NEGATIVE
SPEC GRAV UA: 1.025
Urobilinogen, UA: 0.2
pH, UA: 7

## 2013-12-20 LAB — COMPREHENSIVE METABOLIC PANEL
ALBUMIN: 4 g/dL (ref 3.5–5.2)
ALK PHOS: 76 U/L (ref 39–117)
ALT: 17 U/L (ref 0–35)
AST: 22 U/L (ref 0–37)
BILIRUBIN TOTAL: 0.8 mg/dL (ref 0.3–1.2)
BUN: 11 mg/dL (ref 6–23)
CO2: 25 meq/L (ref 19–32)
Calcium: 9.2 mg/dL (ref 8.4–10.5)
Chloride: 105 mEq/L (ref 96–112)
Creatinine, Ser: 0.7 mg/dL (ref 0.4–1.2)
GFR: 144.77 mL/min (ref >60.00)
Glucose, Bld: 83 mg/dL (ref 70–99)
POTASSIUM: 3.4 meq/L — AB (ref 3.5–5.1)
SODIUM: 137 meq/L (ref 135–145)
TOTAL PROTEIN: 7.9 g/dL (ref 6.0–8.3)

## 2013-12-20 LAB — CBC WITH DIFFERENTIAL/PLATELET
BASOS PCT: 1.3 % (ref 0.0–3.0)
Basophils Absolute: 0.1 10*3/uL (ref 0.0–0.1)
EOS PCT: 2.5 % (ref 0.0–5.0)
Eosinophils Absolute: 0.1 10*3/uL (ref 0.0–0.7)
HCT: 35 % — ABNORMAL LOW (ref 36.0–46.0)
Hemoglobin: 11.6 g/dL — ABNORMAL LOW (ref 12.0–15.0)
LYMPHS PCT: 36.5 % (ref 12.0–46.0)
Lymphs Abs: 1.7 10*3/uL (ref 0.7–4.0)
MCHC: 33.3 g/dL (ref 30.0–36.0)
MCV: 80.1 fl (ref 78.0–100.0)
Monocytes Absolute: 0.4 10*3/uL (ref 0.1–1.0)
Monocytes Relative: 9.3 % (ref 3.0–12.0)
NEUTROS PCT: 50.4 % (ref 43.0–77.0)
Neutro Abs: 2.4 10*3/uL (ref 1.4–7.7)
Platelets: 272 10*3/uL (ref 150.0–400.0)
RBC: 4.37 Mil/uL (ref 3.87–5.11)
RDW: 19.6 % — ABNORMAL HIGH (ref 11.5–14.6)
WBC: 4.8 10*3/uL (ref 4.5–10.5)

## 2013-12-20 LAB — TSH: TSH: 2.48 u[IU]/mL (ref 0.35–5.50)

## 2013-12-20 LAB — T4, FREE: FREE T4: 1.06 ng/dL (ref 0.60–1.60)

## 2013-12-20 LAB — T3, FREE: T3 FREE: 3 pg/mL (ref 2.3–4.2)

## 2013-12-20 NOTE — Patient Instructions (Signed)

## 2013-12-20 NOTE — Progress Notes (Signed)
Subjective:    Patient ID: Courtney [name], female    DOB: [birth year], 23 y.o.   MRN: [id number]  HPI 23 year old African American female, nonsmoker is in for complete physical exam. Has a history of hypertension and currently on HCTZ and tolerating well.    Review of Systems  Constitutional: Negative.   HENT: Negative.   Eyes: Negative.   Respiratory: Negative.   Cardiovascular: Negative.   Gastrointestinal: Negative.   Endocrine: Negative.   Genitourinary: Negative.   Musculoskeletal: Negative.   Skin: Negative.   Allergic/Immunologic: Negative.   Neurological: Negative.   Hematological: Negative.   Psychiatric/Behavioral: Negative.    Past Medical History  Diagnosis Date  . Migraine   . Hypertension     History   Social History  . Marital Status: Single    Spouse Name: N/A    Number of Children: N/A  . Years of Education: N/A   Occupational History  . Not on file.   Social History Main Topics  . Smoking status: Never Smoker   . Smokeless tobacco: Not on file  . Alcohol Use: No  . Drug Use: No  . Sexual Activity: Not on file   Other Topics Concern  . Not on file   Social History Narrative  . No narrative on file    No past surgical history on file.  Family History  Problem Relation Age of Onset  . Diabetes Mother   . Kidney disease Father   . Diabetes Maternal Uncle   . Diabetes Maternal Grandmother   . Cancer Paternal Grandfather     lung    No Known Allergies  Current Outpatient Prescriptions on File Prior to Visit  Medication Sig Dispense Refill  . hydrochlorothiazide (HYDRODIURIL) 12.5 MG tablet Take 1 tablet (12.5 mg total) by mouth daily.  14 tablet  0  . ibuprofen (ADVIL,MOTRIN) 400 MG tablet Take 1 tablet (400 mg total) by mouth every 6 (six) hours as needed.  30 tablet  0   No current facility-administered medications on file prior to visit.    BP 110/92  Pulse 91  Ht 5' 2.75" (1.594 m)  Wt 123 lb (55.792 kg)  BMI  21.96 kg/m2  LMP 10/29/2014chart    Objective:   Physical Exam  Constitutional: She is oriented to person, place, and time. She appears well-developed and well-nourished.  HENT:  Head: Normocephalic and atraumatic.  Right Ear: External ear normal.  Left Ear: External ear normal.  Nose: Nose normal.  Mouth/Throat: Oropharynx is clear and moist.  Eyes: Conjunctivae are normal. Pupils are equal, round, and reactive to light.  Neck: Normal range of motion. Neck supple. No thyromegaly present.  Cardiovascular: Normal rate, regular rhythm and normal heart sounds.   Pulmonary/Chest: Effort normal and breath sounds normal.  Abdominal: Soft. Bowel sounds are normal.  Genitourinary: Vagina normal and uterus normal. Guaiac negative stool. No vaginal discharge found.  Musculoskeletal: Normal range of motion. She exhibits no edema and no tenderness.  Neurological: She is alert and oriented to person, place, and time. She has normal reflexes. She displays normal reflexes. Coordination normal.  Skin: Skin is warm and dry.  Psychiatric: She has a normal mood and affect.          Assessment & Plan:  Courtney [name] was seen today for annual exam.  Diagnoses and associated orders for this visit:  Preventative health care - T4, Free - CMP - Lipid Panel - CBC with Differential - POC Urinalysis Dipstick  Unspecified essential  hypertension - CMP  Screening for malignant neoplasm of the cervix - Cancel: PAP [[place]] - PAP []  Amenorrhea - TSH - T3 - T4, Free - CBC with Differential   Encouraged a healthy diet, exercise, monthly self breast exams. Call the office with ay questions or concerns.

## 2013-12-20 NOTE — Progress Notes (Signed)
Pre visit review using our clinic review tool, if applicable. No additional management support is needed unless otherwise documented below in the visit note. 

## 2013-12-20 NOTE — Addendum Note (Signed)
Addended by: Rita OharaHRASHER, Keldan Eplin R on: 12/20/2013 09:15 AM   Modules accepted: Orders

## 2013-12-20 NOTE — Telephone Encounter (Signed)
Relevant patient education assigned to patient using Emmi. ° °

## 2013-12-22 ENCOUNTER — Other Ambulatory Visit: Payer: Self-pay | Admitting: Family

## 2013-12-22 LAB — CERVICOVAGINAL ANCILLARY ONLY
BACTERIAL VAGINITIS: POSITIVE — AB
CANDIDA VAGINITIS: NEGATIVE

## 2013-12-22 MED ORDER — METRONIDAZOLE 500 MG PO TABS: 500.0000 mg | ORAL_TABLET | Freq: Two times a day (BID) | ORAL | Status: DC

## 2014-06-20 ENCOUNTER — Encounter: Payer: Self-pay | Admitting: Family

## 2014-06-20 ENCOUNTER — Ambulatory Visit (INDEPENDENT_AMBULATORY_CARE_PROVIDER_SITE_OTHER): Payer: BC Managed Care – PPO | Admitting: Family

## 2014-06-20 VITALS — BP 140/92 | HR 102 | Wt 121.8 lb

## 2014-06-20 DIAGNOSIS — IMO0001 Reserved for inherently not codable concepts without codable children: Secondary | ICD-10-CM

## 2014-06-20 DIAGNOSIS — R030 Elevated blood-pressure reading, without diagnosis of hypertension: Secondary | ICD-10-CM

## 2014-06-20 DIAGNOSIS — Z3401 Encounter for supervision of normal first pregnancy, first trimester: Secondary | ICD-10-CM

## 2014-06-20 NOTE — Patient Instructions (Signed)
First Trimester of Pregnancy The first trimester of pregnancy is from week 1 until the end of week 12 (months 1 through 3). A week after a sperm fertilizes an egg, the egg will implant on the wall of the uterus. This embryo will begin to develop into a baby. Genes from you and your partner are forming the baby. The female genes determine whether the baby is a boy or a girl. At 6-8 weeks, the eyes and face are formed, and the heartbeat can be seen on ultrasound. At the end of 12 weeks, all the baby's organs are formed.  Now that you are pregnant, you will want to do everything you can to have a healthy baby. Two of the most important things are to get good prenatal care and to follow your health care provider's instructions. Prenatal care is all the medical care you receive before the baby's birth. This care will help prevent, find, and treat any problems during the pregnancy and childbirth. BODY CHANGES Your body goes through many changes during pregnancy. The changes vary from woman to woman.   You may gain or lose a couple of pounds at first.  You may feel sick to your stomach (nauseous) and throw up (vomit). If the vomiting is uncontrollable, call your health care provider.  You may tire easily.  You may develop headaches that can be relieved by medicines approved by your health care provider.  You may urinate more often. Painful urination may mean you have a bladder infection.  You may develop heartburn as a result of your pregnancy.  You may develop constipation because certain hormones are causing the muscles that push waste through your intestines to slow down.  You may develop hemorrhoids or swollen, bulging veins (varicose veins).  Your breasts may begin to grow larger and become tender. Your nipples may stick out more, and the tissue that surrounds them (areola) may become darker.  Your gums may bleed and may be sensitive to brushing and flossing.  Dark spots or blotches (chloasma,  mask of pregnancy) may develop on your face. This will likely fade after the baby is born.  Your menstrual periods will stop.  You may have a loss of appetite.  You may develop cravings for certain kinds of food.  You may have changes in your emotions from day to day, such as being excited to be pregnant or being concerned that something may go wrong with the pregnancy and baby.  You may have more vivid and strange dreams.  You may have changes in your hair. These can include thickening of your hair, rapid growth, and changes in texture. Some women also have hair loss during or after pregnancy, or hair that feels dry or thin. Your hair will most likely return to normal after your baby is born. WHAT TO EXPECT AT YOUR PRENATAL VISITS During a routine prenatal visit:  You will be weighed to make sure you and the baby are growing normally.  Your blood pressure will be taken.  Your abdomen will be measured to track your baby's growth.  The fetal heartbeat will be listened to starting around week 10 or 12 of your pregnancy.  Test results from any previous visits will be discussed. Your health care provider may ask you:  How you are feeling.  If you are feeling the baby move.  If you have had any abnormal symptoms, such as leaking fluid, bleeding, severe headaches, or abdominal cramping.  If you have any questions. Other tests   that may be performed during your first trimester include:  Blood tests to find your blood type and to check for the presence of any previous infections. They will also be used to check for low iron levels (anemia) and Rh antibodies. Later in the pregnancy, blood tests for diabetes will be done along with other tests if problems develop.  Urine tests to check for infections, diabetes, or protein in the urine.  An ultrasound to confirm the proper growth and development of the baby.  An amniocentesis to check for possible genetic problems.  Fetal screens for  spina bifida and Down syndrome.  You may need other tests to make sure you and the baby are doing well. HOME CARE INSTRUCTIONS  Medicines  Follow your health care provider's instructions regarding medicine use. Specific medicines may be either safe or unsafe to take during pregnancy.  Take your prenatal vitamins as directed.  If you develop constipation, try taking a stool softener if your health care provider approves. Diet  Eat regular, well-balanced meals. Choose a variety of foods, such as meat or vegetable-based protein, fish, milk and low-fat dairy products, vegetables, fruits, and whole grain breads and cereals. Your health care provider will help you determine the amount of weight gain that is right for you.  Avoid raw meat and uncooked cheese. These carry germs that can cause birth defects in the baby.  Eating four or five small meals rather than three large meals a day may help relieve nausea and vomiting. If you start to feel nauseous, eating a few soda crackers can be helpful. Drinking liquids between meals instead of during meals also seems to help nausea and vomiting.  If you develop constipation, eat more high-fiber foods, such as fresh vegetables or fruit and whole grains. Drink enough fluids to keep your urine clear or pale yellow. Activity and Exercise  Exercise only as directed by your health care provider. Exercising will help you:  Control your weight.  Stay in shape.  Be prepared for labor and delivery.  Experiencing pain or cramping in the lower abdomen or low back is a good sign that you should stop exercising. Check with your health care provider before continuing normal exercises.  Try to avoid standing for long periods of time. Move your legs often if you must stand in one place for a long time.  Avoid heavy lifting.  Wear low-heeled shoes, and practice good posture.  You may continue to have sex unless your health care provider directs you  otherwise. Relief of Pain or Discomfort  Wear a good support bra for breast tenderness.   Take warm sitz baths to soothe any pain or discomfort caused by hemorrhoids. Use hemorrhoid cream if your health care provider approves.   Rest with your legs elevated if you have leg cramps or low back pain.  If you develop varicose veins in your legs, wear support hose. Elevate your feet for 15 minutes, 3-4 times a day. Limit salt in your diet. Prenatal Care  Schedule your prenatal visits by the twelfth week of pregnancy. They are usually scheduled monthly at first, then more often in the last 2 months before delivery.  Write down your questions. Take them to your prenatal visits.  Keep all your prenatal visits as directed by your health care provider. Safety  Wear your seat belt at all times when driving.  Make a list of emergency phone numbers, including numbers for family, friends, the hospital, and police and fire departments. General Tips    Ask your health care provider for a referral to a local prenatal education class. Begin classes no later than at the beginning of month 6 of your pregnancy.  Ask for help if you have counseling or nutritional needs during pregnancy. Your health care provider can offer advice or refer you to specialists for help with various needs.  Do not use hot tubs, steam rooms, or saunas.  Do not douche or use tampons or scented sanitary pads.  Do not cross your legs for long periods of time.  Avoid cat litter boxes and soil used by cats. These carry germs that can cause birth defects in the baby and possibly loss of the fetus by miscarriage or stillbirth.  Avoid all smoking, herbs, alcohol, and medicines not prescribed by your health care provider. Chemicals in these affect the formation and growth of the baby.  Schedule a dentist appointment. At home, brush your teeth with a soft toothbrush and be gentle when you floss. SEEK MEDICAL CARE IF:   You have  dizziness.  You have mild pelvic cramps, pelvic pressure, or nagging pain in the abdominal area.  You have persistent nausea, vomiting, or diarrhea.  You have a bad smelling vaginal discharge.  You have pain with urination.  You notice increased swelling in your face, hands, legs, or ankles. SEEK IMMEDIATE MEDICAL CARE IF:   You have a fever.  You are leaking fluid from your vagina.  You have spotting or bleeding from your vagina.  You have severe abdominal cramping or pain.  You have rapid weight gain or loss.  You vomit blood or material that looks like coffee grounds.  You are exposed to German measles and have never had them.  You are exposed to fifth disease or chickenpox.  You develop a severe headache.  You have shortness of breath.  You have any kind of trauma, such as from a fall or a car accident. Document Released: 09/29/2001 Document Revised: 02/19/2014 Document Reviewed: 08/15/2013 Exit[employer] Patient Information 2015 [employer]. This information is not intended to replace advice given to you by your health care provider. Make sure you discuss any questions you have with your health care provider.  

## 2014-06-20 NOTE — Progress Notes (Signed)
Pre visit review using our clinic review tool, if applicable. No additional management support is needed unless otherwise documented below in the visit note. 

## 2014-06-20 NOTE — Progress Notes (Signed)
   Subjective:    Patient ID: Courtney [name], female    DOB: 1991/01/31, 23 y.o.   MRN: [id number]  HPI  23 year old African American female, nonsmoker with a history of elevated blood pressure in the past is in today requesting a pregnancy test her amenorrhea. Last cycle 04/19/2014. Reports she's been taking an over-the-counter multivitamin for fertility. In the past she's been on hydrochlorothiazide for blood pressure but is not currently taking any medication.  Review of Systems  Constitutional: Negative.   HENT: Negative.   Respiratory: Negative.   Cardiovascular: Negative.   Gastrointestinal: Negative.   Endocrine: Negative.   Genitourinary: Positive for menstrual problem. Negative for vaginal bleeding, vaginal discharge, vaginal pain and pelvic pain.  Musculoskeletal: Negative.   Skin: Negative.   Psychiatric/Behavioral: Negative.   All other systems reviewed and are negative.  Past Medical History  Diagnosis Date  . Migraine   . Hypertension     History   Social History  . Marital Status: Single    Spouse Name: N/A    Number of Children: N/A  . Years of Education: N/A   Occupational History  . Not on file.   Social History Main Topics  . Smoking status: Never Smoker   . Smokeless tobacco: Not on file  . Alcohol Use: No  . Drug Use: No  . Sexual Activity: Not on file   Other Topics Concern  . Not on file   Social History Narrative  . No narrative on file    History reviewed. No pertinent past surgical history.  Family History  Problem Relation Age of Onset  . Diabetes Mother   . Kidney disease Father   . Diabetes Maternal Uncle   . Diabetes Maternal Grandmother   . Cancer Paternal Grandfather     lung    No Known Allergies  Current Outpatient Prescriptions on File Prior to Visit  Medication Sig Dispense Refill  . ibuprofen (ADVIL,MOTRIN) 400 MG tablet Take 1 tablet (400 mg total) by mouth every 6 (six) hours as needed.  30 tablet  0    No current facility-administered medications on file prior to visit.    BP 140/92  Pulse 102  Wt 121 lb 12.8 oz (55.248 kg)chart    Objective:   Physical Exam  Constitutional: She is oriented to person, place, and time. She appears well-developed and well-nourished.  Neck: Normal range of motion. Neck supple. No thyromegaly present.  Cardiovascular: Normal rate, regular rhythm and normal heart sounds.   Pulmonary/Chest: Effort normal and breath sounds normal.  Musculoskeletal: Normal range of motion.  Neurological: She is alert and oriented to person, place, and time.  Skin: Skin is warm and dry.  Psychiatric: She has a normal mood and affect.          Assessment & Plan:  [name] was seen today for amenorrhea.  Diagnoses and associated orders for this visit:  Pregnancy, first, first trimester - Ambulatory referral to Obstetrics / Gynecology  Elevated blood pressure   Prenatal vitamin daily.

## 2014-07-04 ENCOUNTER — Emergency Department (HOSPITAL_COMMUNITY): Payer: BC Managed Care – PPO

## 2014-07-04 ENCOUNTER — Encounter (HOSPITAL_COMMUNITY): Payer: Self-pay | Admitting: Emergency Medicine

## 2014-07-04 ENCOUNTER — Emergency Department (HOSPITAL_COMMUNITY)
Admission: EM | Admit: 2014-07-04 | Discharge: 2014-07-04 | Disposition: A | Discharge status: Home / Self Care | Payer: BC Managed Care – PPO | Attending: Emergency Medicine | Admitting: Emergency Medicine

## 2014-07-04 DIAGNOSIS — O368990 Maternal care for other specified fetal problems, unspecified trimester, not applicable or unspecified: Secondary | ICD-10-CM | POA: Diagnosis not present

## 2014-07-04 DIAGNOSIS — O169 Unspecified maternal hypertension, unspecified trimester: Secondary | ICD-10-CM | POA: Diagnosis not present

## 2014-07-04 DIAGNOSIS — O43899 Other placental disorders, unspecified trimester: Secondary | ICD-10-CM

## 2014-07-04 DIAGNOSIS — O418X10 Other specified disorders of amniotic fluid and membranes, first trimester, not applicable or unspecified: Secondary | ICD-10-CM

## 2014-07-04 DIAGNOSIS — O209 Hemorrhage in early pregnancy, unspecified: Secondary | ICD-10-CM | POA: Insufficient documentation

## 2014-07-04 DIAGNOSIS — O200 Threatened abortion: Secondary | ICD-10-CM

## 2014-07-04 DIAGNOSIS — O468X1 Other antepartum hemorrhage, first trimester: Secondary | ICD-10-CM

## 2014-07-04 LAB — TYPE AND SCREEN
ABO/RH(D): O POS
Antibody Screen: NEGATIVE

## 2014-07-04 LAB — ABO/RH: ABO/RH(D): O POS

## 2014-07-04 LAB — CBC
HCT: 35 % — ABNORMAL LOW (ref 36.0–46.0)
Hemoglobin: 11.8 g/dL — ABNORMAL LOW (ref 12.0–15.0)
MCH: 27.1 pg (ref 26.0–34.0)
MCHC: 33.7 g/dL (ref 30.0–36.0)
MCV: 80.3 fL (ref 78.0–100.0)
PLATELETS: 250 10*3/uL (ref 150–400)
RBC: 4.36 MIL/uL (ref 3.87–5.11)
RDW: 15.1 % (ref 11.5–15.5)
WBC: 7.6 10*3/uL (ref 4.0–10.5)

## 2014-07-04 LAB — HCG, QUANTITATIVE, PREGNANCY: HCG, BETA CHAIN, QUANT, S: 93748 m[IU]/mL — AB (ref <5)

## 2014-07-04 NOTE — ED Provider Notes (Signed)
Patient seen at sign out for Dr. Wilkie Aye. Signout was for you of the ultrasound results and discharge if No emergent findings were discovered. Ultrasound result is for a small subchorionic hemorrhage with a viable IUP. These results were reviewed with the patient and her family. They were counseled on the diagnosis of threatened miscarriage. The patient's general condition appears very good she is alert comfortable. Dr. Wilkie Aye had performed a pelvic and abdominal examination and did not find tenderness or pain that would suggest other treatment at this point in time.  Arby Barrette, MD 07/04/14 [phone]

## 2014-07-04 NOTE — ED Notes (Signed)
Pt in c/o vaginal bleeding described as spotting that started just PTA and patient is approx [redacted] weeks pregnant, pt denies pain, she has had an ultrasound confirming intrauterine pregnancy, no distress noted

## 2014-07-04 NOTE — ED Provider Notes (Signed)
CSN: [id number]     Arrival date & time 07/04/14  1217 History   First MD Initiated Contact with Patient 07/04/14 1430     Chief Complaint  Patient presents with  . Vaginal Bleeding     (Consider location/radiation/quality/duration/timing/severity/associated sxs/prior Treatment) HPI  This is a 23 year old G1 P0 approximate [redacted] weeks pregnant with an estimated due date of 02/20/2015 he presents with vaginal bleeding. Patient reports that she started spotting just prior to arrival. She reports dark red blood. She denies any abdominal cramping or pain. She has had an ultrasound to confirm intrauterine pregnancy. She is followed [facility] at [facility]. She denies any dizziness or weakness.  Past Medical History  Diagnosis Date  . Migraine   . Hypertension    History reviewed. No pertinent past surgical history. Family History  Problem Relation Age of Onset  . Diabetes Mother   . Kidney disease Father   . Diabetes Maternal Uncle   . Diabetes Maternal Grandmother   . Cancer Paternal Grandfather     lung   History  Substance Use Topics  . Smoking status: Never Smoker   . Smokeless tobacco: Not on file  . Alcohol Use: No   OB History   Grav Para Term Preterm Abortions TAB SAB Ect Mult Living   1              Review of Systems  Respiratory: Negative for chest tightness and shortness of breath.   Cardiovascular: Negative for chest pain.  Gastrointestinal: Negative for nausea, vomiting and abdominal pain.  Genitourinary: Positive for vaginal bleeding. Negative for dysuria, vaginal pain and pelvic pain.  All other systems reviewed and are negative.     Allergies  Review of patient's allergies indicates no known allergies.  Home Medications   Prior to Admission medications   Not on File   BP 125/74  Pulse 71  Temp(Src) 98.3 F (36.8 C) (Oral)  Resp 16  SpO2 100%  LMP 04/19/2014 Physical Exam  Nursing note and vitals reviewed. Constitutional: She is  oriented to person, place, and time. She appears well-developed and well-nourished. No distress.  HENT:  Head: Normocephalic and atraumatic.  Cardiovascular: Normal rate, regular rhythm and normal heart sounds.   No murmur heard. Pulmonary/Chest: Effort normal and breath sounds normal. No respiratory distress. She has no wheezes.  Abdominal: Soft. Bowel sounds are normal. There is no tenderness. There is no rebound.  Genitourinary:  Normal external vaginal exam, dark red blood noted in the vaginal vault, no active bleeding, cervical os closed   Neurological: She is alert and oriented to person, place, and time.  Skin: Skin is warm and dry.  Psychiatric: She has a normal mood and affect.    ED Course  Procedures (including critical care time) Labs Review Labs Reviewed  HCG, QUANTITATIVE, PREGNANCY - Abnormal; Notable for the following:    hCG, Beta Chain, Quant, S [id number] (*)    All other components within normal limits  CBC - Abnormal; Notable for the following:    Hemoglobin 11.8 (*)    HCT 35.0 (*)    All other components within normal limits  GC/CHLAMYDIA PROBE AMP  TYPE AND SCREEN  ABO/RH    Imaging Review     EKG Interpretation None      MDM   Final diagnoses:  Threatened abortion in first trimester  Subchorionic hematoma in first trimester    Patient presents with vaginal bleeding in first trimester pregnancy. She is nontoxic  on exam. Cervical os is closed.  Patient does have evidence of blood in the vaginal vault. Workup for miscarriage initiated including ultrasound.  Patient also noted to be hypertensive. Reports history of hypertension managed by her primary care physician. Will need to followup with her OB regarding blood pressure management during pregnancy.  [place] pending.  Signed out to Dr. Donnald Garre.     Shon Baton, MD 07/04/14 2021

## 2014-07-04 NOTE — Discharge Instructions (Signed)
Threatened Miscarriage  A threatened miscarriage occurs when you have vaginal bleeding during your first 20 weeks of pregnancy but the pregnancy has not ended. If you have vaginal bleeding during this time, your health care provider will do tests to make sure you are still pregnant. If the tests show you are still pregnant and the developing baby (fetus) inside your womb (uterus) is still growing, your condition is considered a threatened miscarriage.  A threatened miscarriage does not mean your pregnancy will end, but it does increase the risk of losing your pregnancy (complete miscarriage).  CAUSES   The cause of a threatened miscarriage is usually not known. If you go on to have a complete miscarriage, the most common cause is an abnormal number of chromosomes in the developing baby. Chromosomes are the structures inside cells that hold all your genetic material.  Some causes of vaginal bleeding that do not result in miscarriage include:   Having sex.   Having an infection.   Normal hormone changes of pregnancy.   Bleeding that occurs when an egg implants in your uterus.  RISK FACTORS  Risk factors for bleeding in early pregnancy include:   Obesity.   Smoking.   Drinking excessive amounts of alcohol or caffeine.   Recreational drug use.  SIGNS AND SYMPTOMS   Light vaginal bleeding.   Mild abdominal pain or cramps.  DIAGNOSIS   If you have bleeding with or without abdominal pain before 20 weeks of pregnancy, your health care provider will do tests to check whether you are still pregnant. One important test involves using sound waves and a computer (ultrasound) to create images of the inside of your uterus. Other tests include an internal exam of your vagina and uterus (pelvic exam) and measurement of your baby's heart rate.   You may be diagnosed with a threatened miscarriage if:   Ultrasound testing shows you are still pregnant.   Your baby's heart rate is strong.   A pelvic exam shows that the  opening between your uterus and your vagina (cervix) is closed.   Your heart rate and blood pressure are stable.   Blood tests confirm you are still pregnant.  TREATMENT   No treatments have been shown to prevent a threatened miscarriage from going on to a complete miscarriage. However, the right home care is important.   HOME CARE INSTRUCTIONS    Make sure you keep all your appointments for prenatal care. This is very important.   Get plenty of rest.   Do not have sex or use tampons if you have vaginal bleeding.   Do not douche.   Do not smoke or use recreational drugs.   Do not drink alcohol.   Avoid caffeine.  SEEK MEDICAL CARE IF:   You have light vaginal bleeding or spotting while pregnant.   You have abdominal pain or cramping.   You have a fever.  SEEK IMMEDIATE MEDICAL CARE IF:   You have heavy vaginal bleeding.   You have blood clots coming from your vagina.   You have severe low back pain or abdominal cramps.   You have fever, chills, and severe abdominal pain.  MAKE SURE YOU:   Understand these instructions.   Will watch your condition.   Will get help right away if you are not doing well or get worse.  Document Released: 10/05/2005 Document Revised: 10/10/2013 Document Reviewed: 08/01/2013  ExitCare Patient Information 2015 [employer]. This information is not intended to replace advice given to you   by your health care provider. Make sure you discuss any questions you have with your health care provider.  Subchorionic Hematoma  A subchorionic hematoma is a gathering of blood between the outer wall of the placenta and the inner wall of the womb (uterus). The placenta is the organ that connects the fetus to the wall of the uterus. The placenta performs the feeding, breathing (oxygen to the fetus), and waste removal (excretory work) of the fetus.   Subchorionic hematoma is the most common abnormality found on a result from ultrasonography done during the first trimester or early  second trimester of pregnancy. If there has been little or no vaginal bleeding, early small hematomas usually shrink on their own and do not affect your baby or pregnancy. The blood is gradually absorbed over 1-2 weeks. When bleeding starts later in pregnancy or the hematoma is larger or occurs in an older pregnant woman, the outcome may not be as good. Larger hematomas may get bigger, which increases the chances for miscarriage. Subchorionic hematoma also increases the risk of premature detachment of the placenta from the uterus, preterm (premature) labor, and stillbirth.  HOME CARE INSTRUCTIONS   Stay on bed rest if your health care provider recommends this. Although bed rest will not prevent more bleeding or prevent a miscarriage, your health care provider may recommend bed rest until you are advised otherwise.   Avoid heavy lifting (more than 10 lb [4.5 kg]), exercise, sexual intercourse, or douching as directed by your health care provider.   Keep track of the number of pads you use each day and how soaked (saturated) they are. Write down this information.   Do not use tampons.   Keep all follow-up appointments as directed by your health care provider. Your health care provider may ask you to have follow-up blood tests or ultrasound tests or both.  SEEK IMMEDIATE MEDICAL CARE IF:   You have severe cramps in your stomach, back, abdomen, or pelvis.   You have a fever.   You pass large clots or tissue. Save any tissue for your health care provider to look at.   Your bleeding increases or you become lightheaded, feel weak, or have fainting episodes.  Document Released: 01/20/2007 Document Revised: 02/19/2014 Document Reviewed: 05/04/2013  Exit[employer] Patient Information 2015 [employer]. This information is not intended to replace advice given to you by your health care provider. Make sure you discuss any questions you have with your health care provider.

## 2014-07-05 LAB — GC/CHLAMYDIA PROBE AMP
CT Probe RNA: NEGATIVE
GC Probe RNA: NEGATIVE

## 2014-07-11 LAB — OB RESULTS CONSOLE RUBELLA ANTIBODY, IGM: Rubella: IMMUNE

## 2014-07-12 ENCOUNTER — Encounter: Payer: BC Managed Care – PPO | Admitting: Family Medicine

## 2014-07-27 ENCOUNTER — Other Ambulatory Visit: Payer: Self-pay | Admitting: Obstetrics and Gynecology

## 2014-07-30 LAB — CYTOLOGY - PAP

## 2014-08-20 ENCOUNTER — Encounter (HOSPITAL_COMMUNITY): Payer: Self-pay | Admitting: Emergency Medicine

## 2014-10-19 NOTE — L&D Delivery Note (Signed)
Delivery Note At 7:56 AM a viable and healthy female was delivered via  (Presentation:OA ;  ).  APGAR:9 ,9 ; weight 5lb 10 oz .   Placenta status: spontaneous intact not sent, .  Cord:  with the following complications: .  Cord pH: none  Anesthesia: Epidural  Episiotomy:  none Lacerations:  Superficial left labia minora Suture Repair: 3.0 chromic Est. Blood Loss (mL): 100   Mom to postpartum.  Baby to Couplet care / Skin to Skin.  Courtney [name] A 02/14/2015, 8:13 AM

## 2014-12-05 LAB — OB RESULTS CONSOLE HEPATITIS B SURFACE ANTIGEN: Hepatitis B Surface Ag: NEGATIVE

## 2014-12-05 LAB — OB RESULTS CONSOLE GC/CHLAMYDIA
Chlamydia: NEGATIVE
Gonorrhea: NEGATIVE

## 2014-12-05 LAB — OB RESULTS CONSOLE HIV ANTIBODY (ROUTINE TESTING): HIV: NONREACTIVE

## 2014-12-05 LAB — OB RESULTS CONSOLE ABO/RH: RH Type: POSITIVE

## 2014-12-05 LAB — OB RESULTS CONSOLE RPR: RPR: NONREACTIVE

## 2014-12-27 ENCOUNTER — Other Ambulatory Visit (HOSPITAL_COMMUNITY): Payer: Self-pay | Admitting: Obstetrics and Gynecology

## 2014-12-27 DIAGNOSIS — IMO0002 Reserved for concepts with insufficient information to code with codable children: Secondary | ICD-10-CM

## 2015-01-01 ENCOUNTER — Encounter (HOSPITAL_COMMUNITY): Payer: Self-pay

## 2015-01-01 ENCOUNTER — Ambulatory Visit (HOSPITAL_COMMUNITY)
Admission: RE | Admit: 2015-01-01 | Discharge: 2015-01-01 | Disposition: A | Discharge status: Home / Self Care | Payer: 59 | Source: Ambulatory Visit | Attending: Obstetrics and Gynecology | Admitting: Obstetrics and Gynecology

## 2015-01-01 ENCOUNTER — Other Ambulatory Visit (HOSPITAL_COMMUNITY): Payer: Self-pay | Admitting: Obstetrics and Gynecology

## 2015-01-01 VITALS — BP 136/76 | HR 107 | Wt 141.0 lb

## 2015-01-01 DIAGNOSIS — O365930 Maternal care for other known or suspected poor fetal growth, third trimester, not applicable or unspecified: Secondary | ICD-10-CM | POA: Diagnosis not present

## 2015-01-01 DIAGNOSIS — Z3A32 32 weeks gestation of pregnancy: Secondary | ICD-10-CM | POA: Insufficient documentation

## 2015-01-01 DIAGNOSIS — IMO0002 Reserved for concepts with insufficient information to code with codable children: Secondary | ICD-10-CM

## 2015-01-01 DIAGNOSIS — Z3689 Encounter for other specified antenatal screening: Secondary | ICD-10-CM | POA: Insufficient documentation

## 2015-01-01 DIAGNOSIS — Z36 Encounter for antenatal screening of mother: Secondary | ICD-10-CM | POA: Insufficient documentation

## 2015-01-02 ENCOUNTER — Other Ambulatory Visit (HOSPITAL_COMMUNITY): Payer: Self-pay | Admitting: Obstetrics and Gynecology

## 2015-01-02 LAB — US OB COMP + 14 WK

## 2015-01-16 LAB — OB RESULTS CONSOLE GBS: STREP GROUP B AG: NEGATIVE

## 2015-02-07 ENCOUNTER — Encounter (HOSPITAL_COMMUNITY): Payer: Self-pay | Admitting: *Deleted

## 2015-02-07 ENCOUNTER — Telehealth (HOSPITAL_COMMUNITY): Payer: Self-pay | Admitting: *Deleted

## 2015-02-07 NOTE — Telephone Encounter (Signed)
Preadmission screen  

## 2015-02-13 ENCOUNTER — Inpatient Hospital Stay (HOSPITAL_COMMUNITY)
Admission: RE | Admit: 2015-02-13 | Discharge: 2015-02-16 | DRG: 775 | Disposition: A | Discharge status: Home / Self Care | Payer: 59 | Source: Ambulatory Visit | Attending: Obstetrics and Gynecology | Admitting: Obstetrics and Gynecology

## 2015-02-13 ENCOUNTER — Encounter (HOSPITAL_COMMUNITY): Payer: Self-pay

## 2015-02-13 ENCOUNTER — Other Ambulatory Visit: Payer: Self-pay | Admitting: Obstetrics and Gynecology

## 2015-02-13 DIAGNOSIS — Z3A39 39 weeks gestation of pregnancy: Secondary | ICD-10-CM | POA: Diagnosis present

## 2015-02-13 DIAGNOSIS — Z3403 Encounter for supervision of normal first pregnancy, third trimester: Secondary | ICD-10-CM | POA: Diagnosis present

## 2015-02-13 DIAGNOSIS — O365990 Maternal care for other known or suspected poor fetal growth, unspecified trimester, not applicable or unspecified: Secondary | ICD-10-CM | POA: Diagnosis present

## 2015-02-13 LAB — TYPE AND SCREEN
ABO/RH(D): O POS
Antibody Screen: NEGATIVE

## 2015-02-13 LAB — CBC
HEMATOCRIT: 36.3 % (ref 36.0–46.0)
Hemoglobin: 12.7 g/dL (ref 12.0–15.0)
MCH: 29.9 pg (ref 26.0–34.0)
MCHC: 35 g/dL (ref 30.0–36.0)
MCV: 85.4 fL (ref 78.0–100.0)
Platelets: 193 10*3/uL (ref 150–400)
RBC: 4.25 MIL/uL (ref 3.87–5.11)
RDW: 13.6 % (ref 11.5–15.5)
WBC: 8.3 10*3/uL (ref 4.0–10.5)

## 2015-02-13 MED ORDER — LACTATED RINGERS IV SOLN: 500.0000 mL | INTRAVENOUS | Status: DC | PRN

## 2015-02-13 MED ORDER — OXYTOCIN 10 UNIT/ML IJ SOLN: 10.0000 [IU] | Freq: Once | INTRAMUSCULAR | Status: DC

## 2015-02-13 MED ORDER — MISOPROSTOL 25 MCG QUARTER TABLET
25.0000 ug | ORAL_TABLET | ORAL | Status: DC | PRN
  Administered 2015-02-13: 25 ug via VAGINAL
  Filled 2015-02-13: qty 1
  Filled 2015-02-13: qty 0.25

## 2015-02-13 MED ORDER — CITRIC ACID-SODIUM CITRATE 334-500 MG/5ML PO SOLN: 30.0000 mL | ORAL | Status: DC | PRN

## 2015-02-13 MED ORDER — OXYTOCIN 40 UNITS IN LACTATED RINGERS INFUSION - SIMPLE MED
62.5000 mL/h | INTRAVENOUS | Status: DC
  Filled 2015-02-13: qty 1000

## 2015-02-13 MED ORDER — OXYCODONE-ACETAMINOPHEN 5-325 MG PO TABS: 2.0000 | ORAL_TABLET | ORAL | Status: DC | PRN

## 2015-02-13 MED ORDER — LACTATED RINGERS IV SOLN
INTRAVENOUS | Status: DC
  Administered 2015-02-14: 03:00:00 via INTRAVENOUS

## 2015-02-13 MED ORDER — LIDOCAINE HCL (PF) 1 % IJ SOLN
30.0000 mL | INTRAMUSCULAR | Status: DC | PRN
  Filled 2015-02-13: qty 30

## 2015-02-13 MED ORDER — ONDANSETRON HCL 4 MG/2ML IJ SOLN: 4.0000 mg | Freq: Four times a day (QID) | INTRAMUSCULAR | Status: DC | PRN

## 2015-02-13 MED ORDER — BUTORPHANOL TARTRATE 1 MG/ML IJ SOLN
1.0000 mg | INTRAMUSCULAR | Status: DC | PRN
  Administered 2015-02-14: 1 mg via INTRAVENOUS
  Filled 2015-02-13: qty 1

## 2015-02-13 MED ORDER — ACETAMINOPHEN 325 MG PO TABS: 650.0000 mg | ORAL_TABLET | ORAL | Status: DC | PRN

## 2015-02-13 MED ORDER — OXYTOCIN BOLUS FROM INFUSION
500.0000 mL | INTRAVENOUS | Status: DC
  Administered 2015-02-14: 500 mL via INTRAVENOUS

## 2015-02-13 MED ORDER — ZOLPIDEM TARTRATE 5 MG PO TABS: 5.0000 mg | ORAL_TABLET | Freq: Every evening | ORAL | Status: DC | PRN

## 2015-02-13 MED ORDER — TERBUTALINE SULFATE 1 MG/ML IJ SOLN: 0.2500 mg | Freq: Once | INTRAMUSCULAR | Status: AC | PRN

## 2015-02-13 MED ORDER — OXYCODONE-ACETAMINOPHEN 5-325 MG PO TABS: 1.0000 | ORAL_TABLET | ORAL | Status: DC | PRN

## 2015-02-13 NOTE — H&P (Signed)
Courtney RoyaltyBrittany [name] is a 24 y.o. female presenting for IOL 2nd to SGA.  Pt was followed after sonogram done for poor maternal weight gain. Pt was diagnosed with SGA and followed by weekly BPP, AFI, dopplers and serial growth. Last sono: 5lb 15 oz, nl fluid. Maternal Medical History:  Fetal activity: Perceived fetal activity is normal.    Prenatal complications: SGA    OB History    Gravida Para Term Preterm AB TAB SAB Ectopic Multiple Living   1              Past Medical History  Diagnosis Date  . Migraine   . Hypertension    Past Surgical History  Procedure Laterality Date  . Wisdom tooth extraction     Family History: family history includes Autism in her paternal uncle; Cancer in her paternal grandfather; Diabetes in her maternal grandmother, maternal uncle, and mother; Hypertension in her father; Kidney disease in her father. Social History:  reports that she has never smoked. She does not have any smokeless tobacco history on file. She reports that she does not drink alcohol or use illicit drugs.   Prenatal Transfer Tool  Maternal Diabetes: No Genetic Screening: Normal Maternal Ultrasounds/Referrals: Normal Fetal Ultrasounds or other Referrals:  None, Referred to Materal Fetal Medicine  Maternal Substance Abuse:  No Significant Maternal Medications:  None Significant Maternal Lab Results:  Lab values include: Group B Strep negative Other Comments:  SGA  Review of Systems  All other systems reviewed and are negative.     Height 5\' 2"  (1.575 m), weight 68.04 kg (150 lb), last menstrual period 05/16/2014. Exam Physical Exam  Constitutional: She is oriented to person, place, and time. She appears well-developed and well-nourished.  HENT:  Head: Atraumatic.  Eyes: EOM are normal.  Neck: Neck supple.  Cardiovascular: Regular rhythm.   Respiratory: Breath sounds normal.  GI: Soft.  Musculoskeletal: She exhibits no edema.  Neurological: She is alert and oriented to  person, place, and time.  Skin: Skin is warm and dry.  Psychiatric: She has a normal mood and affect.    Prenatal labs: ABO, Rh: --/--/O POS, O POS (09/16 1530) Antibody: NEG (09/16 1530) Rubella:  Immune RPR:   NR  HBsAg:   neg HIV:   neg GBS: Negative (03/30 0000)   Assessment/Plan: SGA Term gestation P) admit routine Lab. cytotec. Analgesic . Pitocin in am   Nelia Rogoff A 02/13/2015, 8:18 PM

## 2015-02-14 ENCOUNTER — Encounter (HOSPITAL_COMMUNITY): Payer: Self-pay

## 2015-02-14 ENCOUNTER — Inpatient Hospital Stay (HOSPITAL_COMMUNITY): Payer: 59 | Admitting: Anesthesiology

## 2015-02-14 LAB — CBC
HCT: 35 % — ABNORMAL LOW (ref 36.0–46.0)
Hemoglobin: 12.3 g/dL (ref 12.0–15.0)
MCH: 30.1 pg (ref 26.0–34.0)
MCHC: 35.1 g/dL (ref 30.0–36.0)
MCV: 85.6 fL (ref 78.0–100.0)
PLATELETS: 159 10*3/uL (ref 150–400)
RBC: 4.09 MIL/uL (ref 3.87–5.11)
RDW: 13.5 % (ref 11.5–15.5)
WBC: 12.9 10*3/uL — AB (ref 4.0–10.5)

## 2015-02-14 LAB — ABO/RH: ABO/RH(D): O POS

## 2015-02-14 LAB — RPR: RPR: NONREACTIVE

## 2015-02-14 MED ORDER — ZOLPIDEM TARTRATE 5 MG PO TABS: 5.0000 mg | ORAL_TABLET | Freq: Every evening | ORAL | Status: DC | PRN

## 2015-02-14 MED ORDER — PHENYLEPHRINE 40 MCG/ML (10ML) SYRINGE FOR IV PUSH (FOR BLOOD PRESSURE SUPPORT)
PREFILLED_SYRINGE | INTRAVENOUS | Status: AC
  Filled 2015-02-14: qty 20

## 2015-02-14 MED ORDER — PRENATAL MULTIVITAMIN CH
1.0000 | ORAL_TABLET | Freq: Every day | ORAL | Status: DC
  Administered 2015-02-14 – 2015-02-16 (×3): 1 via ORAL
  Filled 2015-02-14 (×3): qty 1

## 2015-02-14 MED ORDER — OXYCODONE-ACETAMINOPHEN 5-325 MG PO TABS: 1.0000 | ORAL_TABLET | ORAL | Status: DC | PRN

## 2015-02-14 MED ORDER — PHENYLEPHRINE 40 MCG/ML (10ML) SYRINGE FOR IV PUSH (FOR BLOOD PRESSURE SUPPORT)
80.0000 ug | PREFILLED_SYRINGE | INTRAVENOUS | Status: DC | PRN
  Filled 2015-02-14: qty 2

## 2015-02-14 MED ORDER — OXYCODONE-ACETAMINOPHEN 5-325 MG PO TABS: 2.0000 | ORAL_TABLET | ORAL | Status: DC | PRN

## 2015-02-14 MED ORDER — LANOLIN HYDROUS EX OINT: TOPICAL_OINTMENT | CUTANEOUS | Status: DC | PRN

## 2015-02-14 MED ORDER — LACTATED RINGERS IV SOLN: 500.0000 mL | Freq: Once | INTRAVENOUS | Status: DC

## 2015-02-14 MED ORDER — DIPHENHYDRAMINE HCL 25 MG PO CAPS: 25.0000 mg | ORAL_CAPSULE | Freq: Four times a day (QID) | ORAL | Status: DC | PRN

## 2015-02-14 MED ORDER — EPHEDRINE 5 MG/ML INJ
10.0000 mg | INTRAVENOUS | Status: DC | PRN
  Filled 2015-02-14: qty 2

## 2015-02-14 MED ORDER — FENTANYL 2.5 MCG/ML BUPIVACAINE 1/10 % EPIDURAL INFUSION (WH - ANES)
INTRAMUSCULAR | Status: AC
  Administered 2015-02-14: 14 mL/h via EPIDURAL
  Filled 2015-02-14: qty 125

## 2015-02-14 MED ORDER — SIMETHICONE 80 MG PO CHEW: 80.0000 mg | CHEWABLE_TABLET | ORAL | Status: DC | PRN

## 2015-02-14 MED ORDER — BENZOCAINE-MENTHOL 20-0.5 % EX AERO
1.0000 "application " | INHALATION_SPRAY | CUTANEOUS | Status: DC | PRN
  Filled 2015-02-14: qty 56

## 2015-02-14 MED ORDER — WITCH HAZEL-GLYCERIN EX PADS: 1.0000 "application " | MEDICATED_PAD | CUTANEOUS | Status: DC | PRN

## 2015-02-14 MED ORDER — DIPHENHYDRAMINE HCL 50 MG/ML IJ SOLN: 12.5000 mg | INTRAMUSCULAR | Status: DC | PRN

## 2015-02-14 MED ORDER — OXYTOCIN 40 UNITS IN LACTATED RINGERS INFUSION - SIMPLE MED
1.0000 m[IU]/min | INTRAVENOUS | Status: DC
  Administered 2015-02-14: 2 m[IU]/min via INTRAVENOUS

## 2015-02-14 MED ORDER — FERROUS SULFATE 325 (65 FE) MG PO TABS
325.0000 mg | ORAL_TABLET | Freq: Two times a day (BID) | ORAL | Status: DC
  Administered 2015-02-14 – 2015-02-15 (×2): 325 mg via ORAL
  Filled 2015-02-14 (×2): qty 1

## 2015-02-14 MED ORDER — IBUPROFEN 600 MG PO TABS
600.0000 mg | ORAL_TABLET | Freq: Four times a day (QID) | ORAL | Status: DC
  Administered 2015-02-14 – 2015-02-16 (×9): 600 mg via ORAL
  Filled 2015-02-14 (×9): qty 1

## 2015-02-14 MED ORDER — ACETAMINOPHEN 325 MG PO TABS: 650.0000 mg | ORAL_TABLET | ORAL | Status: DC | PRN

## 2015-02-14 MED ORDER — FENTANYL 2.5 MCG/ML BUPIVACAINE 1/10 % EPIDURAL INFUSION (WH - ANES)
14.0000 mL/h | INTRAMUSCULAR | Status: DC | PRN
  Administered 2015-02-14 (×3): 14 mL/h via EPIDURAL

## 2015-02-14 MED ORDER — SENNOSIDES-DOCUSATE SODIUM 8.6-50 MG PO TABS
2.0000 | ORAL_TABLET | ORAL | Status: DC
  Administered 2015-02-14: 2 via ORAL
  Filled 2015-02-14 (×2): qty 2

## 2015-02-14 MED ORDER — LIDOCAINE HCL (PF) 1 % IJ SOLN
INTRAMUSCULAR | Status: DC | PRN
  Administered 2015-02-14: 6 mL
  Administered 2015-02-14: 4 mL

## 2015-02-14 MED ORDER — DIBUCAINE 1 % RE OINT: 1.0000 "application " | TOPICAL_OINTMENT | RECTAL | Status: DC | PRN

## 2015-02-14 MED ORDER — ONDANSETRON HCL 4 MG/2ML IJ SOLN: 4.0000 mg | INTRAMUSCULAR | Status: DC | PRN

## 2015-02-14 MED ORDER — ONDANSETRON HCL 4 MG PO TABS: 4.0000 mg | ORAL_TABLET | ORAL | Status: DC | PRN

## 2015-02-14 NOTE — Progress Notes (Signed)
Called with c/o urge to push Pt was  Ant lip per RN. Ok for start pushing  On arrival, crowning  ImP: complete P) anticipate SVD

## 2015-02-14 NOTE — Anesthesia Preprocedure Evaluation (Addendum)
Anesthesia Evaluation  Patient identified by MRN, date of birth, ID band Patient awake    Reviewed: Allergy & Precautions, H&P , Patient's Chart, lab work & pertinent test results  Airway Mallampati: II TM Distance: >3 FB Neck ROM: full    Dental  (+) Teeth Intact   Pulmonary  breath sounds clear to auscultation        Cardiovascular hypertension, Rhythm:regular Rate:Normal     Neuro/Psych    GI/Hepatic   Endo/Other    Renal/GU      Musculoskeletal   Abdominal   Peds  Hematology   Anesthesia Other Findings       Reproductive/Obstetrics (+) Pregnancy                          Anesthesia Physical Anesthesia Plan  ASA: II  Anesthesia Plan: Epidural   Post-op Pain Management:    Induction:   Airway Management Planned:   Additional Equipment:   Intra-op Plan:   Post-operative Plan:   Informed Consent: I have reviewed the patients History and Physical, chart, labs and discussed the procedure including the risks, benefits and alternatives for the proposed anesthesia with the patient or authorized representative who has indicated his/her understanding and acceptance.   Dental Advisory Given  Plan Discussed with:   Anesthesia Plan Comments: (Labs checked- platelets confirmed with RN in room. Fetal heart tracing, per RN, reported to be stable enough for sitting procedure. Discussed epidural, and patient consents to the procedure:  included risk of possible headache,backache, failed block, allergic reaction, and nerve injury. This patient was asked if she had any questions or concerns before the procedure started.)        Anesthesia Quick Evaluation  

## 2015-02-14 NOTE — Anesthesia Procedure Notes (Signed)

## 2015-02-14 NOTE — Anesthesia Postprocedure Evaluation (Signed)
Anesthesia Post Note  Patient: Courtney [name]  Procedure(s) Performed: * No procedures listed *  Anesthesia type: Epidural  Patient location: Mother/Baby  Post pain: Pain level controlled  Post assessment: Post-op Vital signs reviewed  Last Vitals:  Filed Vitals:   02/14/15 1251  BP: 114/69  Pulse: 92  Temp: 36.9 C  Resp: 18    Post vital signs: Reviewed  Level of consciousness:alert  Complications: No apparent anesthesia complications

## 2015-02-14 NOTE — Lactation Note (Signed)
This note was copied from the chart of Courtney NigeriaBrittany Ferrer. Lactation Consultation Note  Patient Name: Courtney [name] WUXLK'GToday's Date: 02/14/2015 Reason for consult: Initial assessment;Infant < 6lbs Baby STS on Mom when LC arrived. Mom had attempted to BF earlier but baby was sleepy. Demonstrated awakening techniques to Mom and reviewed hand expression, few drops of colostrum received. Assisted Mom with positioning and baby latched in football hold. Baby would demonstrate a good suckling pattern for few suckles then would slip to shallow latch requiring Mom re-latch baby. Basic teaching reviewed with Mom. Encouraged to Bf with feeding ques, but advised if baby not waking to BF notify staff for assistance. Lactation brochure left for review, advised of OP services and support group. Call for assist as needed/questions or concerns.   Maternal Data    Feeding Feeding Type: Breast Fed Length of feed: 10 min (off/on)  LATCH Score/Interventions Latch: Repeated attempts needed to sustain latch, nipple held in mouth throughout feeding, stimulation needed to elicit sucking reflex. Intervention(s): Adjust position;Assist with latch;Breast massage;Breast compression  Audible Swallowing: None  Type of Nipple: Everted at rest and after stimulation (flatten slightly w/breast compression)  Comfort (Breast/Nipple): Soft / non-tender     Hold (Positioning): Assistance needed to correctly position infant at breast and maintain latch. Intervention(s): Breastfeeding basics reviewed;Support Pillows;Position options;Skin to skin  LATCH Score: 6  Lactation Tools Discussed/Used     Consult Status Consult Status: Follow-up Date: 02/15/15 Follow-up type: In-patient    Alfred LevinsGranger, Parley Pidcock Ann 02/14/2015, 4:12 PM

## 2015-02-14 NOTE — Progress Notes (Signed)
S: breathing with ctx S/p cytotec x 1  O: Pitocin 1 miu  VE 2/60/-3 Intact  Intracervical balloon placed  Tracing: baseline 130 (+) accels Ctx q 2 mins  IM: SGA Term gestation P) Epidural. Cont pitocin. Defer amniotomy

## 2015-02-15 LAB — CBC
HEMATOCRIT: 32.6 % — AB (ref 36.0–46.0)
Hemoglobin: 11.2 g/dL — ABNORMAL LOW (ref 12.0–15.0)
MCH: 29.7 pg (ref 26.0–34.0)
MCHC: 34.4 g/dL (ref 30.0–36.0)
MCV: 86.5 fL (ref 78.0–100.0)
PLATELETS: 162 10*3/uL (ref 150–400)
RBC: 3.77 MIL/uL — ABNORMAL LOW (ref 3.87–5.11)
RDW: 13.8 % (ref 11.5–15.5)
WBC: 13.2 10*3/uL — ABNORMAL HIGH (ref 4.0–10.5)

## 2015-02-15 NOTE — Lactation Note (Addendum)
This note was copied from the chart of Courtney NigeriaBrittany Bump. Lactation Consultation Note New mom has been BF well, states its getting better.  Mom requested DEBP, given for breast stimulation. Baby weight 5.8  Mom shown how to use DEBP & how to disassemble, clean, & reassemble parts. Mom knows to pump q3h for 15-20 min. Post-pump.  Mom encouraged to do skin-to-skin.Encouraged comfort during BF so colostrum flows better and mom will enjoy the feeding longer. Taking deep breaths and breast massage during BF. Mom encouraged to waken baby for feeds. Mom encouraged to feed baby 8-12 times/24 hours and with feeding cues. Since baby is low weight stressed importance of feedings.  Mom has shells and encouraged to wear them between feedings. Has everted nipples w/short shaft nipples. Hand expression taught to Mom, mom excited to see colostrum. Encouraged to hand express after pumping and give any colostrum to baby.  Patient Name: Courtney [name] ONGEX'BToday's Date: 02/15/2015 Reason for consult: Follow-up assessment;Infant < 6lbs   Maternal Data    Feeding Feeding Type: Breast Fed Length of feed: 10 min  LATCH Score/Interventions Latch: Grasps breast easily, tongue down, lips flanged, rhythmical sucking. Intervention(s): Skin to skin;Teach feeding cues;Waking techniques Intervention(s): Adjust position;Assist with latch;Breast massage;Breast compression  Audible Swallowing: A few with stimulation Intervention(s): Skin to skin;Hand expression  Type of Nipple: Everted at rest and after stimulation (short shaft)  Comfort (Breast/Nipple): Filling, red/small blisters or bruises, mild/mod discomfort  Problem noted: Mild/Moderate discomfort Interventions (Mild/moderate discomfort): Comfort gels;Hand massage;Hand expression;Post-pump  Hold (Positioning): Assistance needed to correctly position infant at breast and maintain latch. Intervention(s): Breastfeeding basics reviewed;Support  Pillows;Position options;Skin to skin  LATCH Score: 7  Lactation Tools Discussed/Used Tools: Shells;Pump;Comfort gels Shell Type: Inverted Breast pump type: Double-Electric Breast Pump Pump Review: Setup, frequency, and cleaning;Milk Storage Initiated by:: Peri JeffersonL. Trina Asch RN Date initiated:: 02/15/15   Consult Status Consult Status: Follow-up Date: 02/16/15 Follow-up type: In-patient    Courtney DancerCARVER, Courtney [name] 02/15/2015, 9:05 PM

## 2015-02-15 NOTE — Progress Notes (Signed)
PPD #1- SVD  Subjective:   Reports feeling well Tolerating po/ No nausea or vomiting Bleeding is light Pain controlled with Motrin Up ad lib / ambulatory / voiding without problems Newborn: breastfeeding    Objective:   VS:  VS:  Filed Vitals:   02/14/15 1030 02/14/15 1251 02/14/15 1809 02/15/15 0612  BP: 131/90 114/69 128/75 112/71  Pulse: 103 92 97 104  Temp:  98.5 F (36.9 C) 98.5 F (36.9 C) 98.3 F (36.8 C)  TempSrc:  Oral Oral Oral  Resp: 18 18 18 18   Height:      Weight:      SpO2:  97%  100%    LABS:  Recent Labs  02/14/15 0840 02/15/15 0605  WBC 12.9* 13.2*  HGB 12.3 11.2*  PLT 159 162   Blood type: --/--/O POS, O POS (04/27 2010) Rubella: Immune (09/23 0000)   I&O: Intake/Output      04/28 0701 - 04/29 0700 04/29 0701 - 04/30 0700   Urine (mL/kg/hr) 300 (0.2)    Blood 140 (0.1)    Total Output 440     Net -440            Physical Exam: Alert and oriented x3 Abdomen: soft, non-tender, non-distended  Fundus: firm, non-tender, U-2 Perineum: Well approximated, no significant erythema, edema, or drainage; healing well. Lochia: small Extremities: no edema, no calf pain or tenderness    Assessment:  PPD #1 G1P1001/ S/P:spontaneous vaginal, labial laceratiob  Doing well    Plan: Continue routine post partum orders Anticipate D/C home tomorrow   Donette LarryBHAMBRI, Kaylen Motl, N MSN, CNM 02/15/2015, 11:30 AM

## 2015-02-15 NOTE — Lactation Note (Signed)
This note was copied from the chart of Courtney NigeriaBrittany Byland. Lactation Consultation Note Mom concerned about having enough colostrum. Requesting DEBP. Mom had a room full of company. Instructed would come back for pumping instructions. Patient Name: Courtney [name] Today's Date: 02/15/2015     Maternal Data    Feeding    LATCH Score/Interventions Latch: Grasps breast easily, tongue down, lips flanged, rhythmical sucking.  Audible Swallowing: A few with stimulation  Type of Nipple: Everted at rest and after stimulation  Comfort (Breast/Nipple): Soft / non-tender     Hold (Positioning): Assistance needed to correctly position infant at breast and maintain latch.  LATCH Score: 8  Lactation Tools Discussed/Used     Consult Status      Haydan Wedig G 02/15/2015, 6:18 PM

## 2015-02-16 MED ORDER — IBUPROFEN 600 MG PO TABS: 600.0000 mg | ORAL_TABLET | Freq: Four times a day (QID) | ORAL | Status: DC

## 2015-02-16 NOTE — Discharge Instructions (Signed)
Breast Pumping Tips °If you are breastfeeding, there may be times when you cannot feed your baby directly. Returning to work or going on a trip are common examples. Pumping allows you to store breast milk and feed it to your baby later.  °You may not get much milk when you first start to pump. Your breasts should start to make more after a few days. If you pump at the times you usually feed your baby, you may be able to keep making enough milk to feed your baby without also using formula. The more often you pump, the more milk you will produce.  °WHEN SHOULD I PUMP?  °· You can begin to pump soon after delivery. However, some experts recommend waiting about 4 weeks before giving your infant a bottle to make sure breastfeeding is going well.  °· If you plan to return to work, begin pumping a few weeks before. This will help you develop techniques that work best for you. It also lets you build up a supply of breast milk.   °· When you are with your infant, feed on demand and pump after each feeding.   °· When you are away from your infant for several hours, pump for about 15 minutes every 2-3 hours. Pump both breasts at the same time if you can.   °· If your infant has a formula feeding, make sure to pump around the same time.     °· If you drink any alcohol, wait 2 hours before pumping.   °HOW DO I PREPARE TO PUMP? °Your let-down reflex is the natural reaction to stimulation that makes your breast milk flow. It is easier to stimulate this reflex when you are relaxed. Find relaxation techniques that work for you. If you have difficulty with your let-down reflex, try these methods:  °· Smell one of your infant's blankets or an item of clothing.   °· Look at a picture or video of your infant.   °· Sit in a quiet, private space.   °· Massage the breast you plan to pump.   °· Place soothing warmth on the breast.   °· Play relaxing music.   °WHAT ARE SOME GENERAL BREAST PUMPING TIPS? °· Wash your hands before you pump. You  do not need to wash your nipples or breasts. °· There are three ways to pump. °¨ You can use your hand to massage and compress your breast. °¨ You can use a handheld manual pump. °¨ You can use an electric pump.   °· Make sure the suction cup (flange) on the breast pump is the right size. Place the flange directly over the nipple. If it is the wrong size or placed the wrong way, it may be painful and cause nipple damage.   °· If pumping is uncomfortable, apply a small amount of purified or modified lanolin to your nipple and areola. °· If you are using an electric pump, adjust the speed and suction power to be more comfortable. °· If pumping is painful or if you are not getting very much milk, you may need a different type of pump. A lactation consultant can help you determine what type of pump to use.   °· Keep a full water bottle near you at all times. Drinking lots of fluid helps you make more milk.  °· You can store your milk to use later. Pumped breast milk can be stored in a sealable, sterile container or plastic bag. Label all stored breast milk with the date you pumped it. °¨ Milk can stay out at room temperature for up to 8 hours. °¨   You can store your milk in the refrigerator for up to 8 days. °¨ You can store your milk in the freezer for 3 months. Thaw frozen milk using warm water. Do not put it in the microwave. °· Do not smoke. Smoking can lower your milk supply and harm your infant. If you need help quitting, ask your health care provider to recommend a program.   °WHEN SHOULD I CALL MY HEALTH CARE PROVIDER OR A LACTATION CONSULTANT? °· You are having trouble pumping. °· You are concerned that you are not making enough milk. °· You have nipple pain, soreness, or redness. °· You want to use birth control. Birth control pills may lower your milk supply. Talk to your health care provider about your options. °Document Released: 03/25/2010 Document Revised: 10/10/2013 Document Reviewed:  07/28/2013 °Exit[employer]® Patient Information ©2015 [employer]. This information is not intended to replace advice given to you by your health care provider. Make sure you discuss any questions you have with your health care provider. ° °Nutrition for the New Mother  °A new mother needs good health and nutrition so she can have energy to take care of a new baby. Whether a mother breastfeeds or formula feeds the baby, it is important to have a well-balanced diet. Foods from all the food groups should be chosen to meet the new mother's energy needs and to give her the nutrients needed for repair and healing.  °A HEALTHY EATING PLAN °The My Pyramid plan for Moms outlines what you should eat to help you and your baby stay healthy. The energy and amount of food you need depends on whether or not you are breastfeeding. If you are breastfeeding you will need more nutrients. If you choose not to breastfeed, your nutrition goal should be to return to a healthy weight. Limiting calories may be needed if you are not breastfeeding.  °HOME CARE INSTRUCTIONS  °· For a personal plan based on your unique needs, see your Registered Dietitian or visit www.mypyramid.gov. °· Eat a variety of foods. The plan below will help guide you. The following chart has a suggested daily meal plan from the My Pyramid for Moms. °· Eat a variety of fruits and vegetables. °· Eat more dark green and orange vegetables and cooked dried beans. °· Make half your grains whole grains. Choose whole instead of refined grains. °· Choose low-fat or lean meats and poultry. °· Choose low-fat or fat-free dairy products like milk, cheese, or yogurt. °Fruits °· Breastfeeding: 2 cups °· Non-Breastfeeding: 2 cups °· What Counts as a serving? °¨ 1 cup of fruit or juice. °¨ ½ cup dried fruit. °Vegetables °· Breastfeeding: 3 cups °· Non-Breastfeeding: 2 ½ cups °· What Counts as a serving? °¨ 1 cup raw or cooked vegetables. °¨ Juice or 2 cups raw leafy  vegetables. °Grains °· Breastfeeding: 8 oz °· Non-Breastfeeding: 6 oz °· What Counts as a serving? °¨ 1 slice bread. °¨ 1 oz ready-to-eat cereal. °¨ ½ cup cooked pasta, rice, or cereal. °Meat and Beans °· Breastfeeding: 6 ½ oz °· Non-Breastfeeding: 5 ½ oz °· What Counts as a serving? °¨ 1 oz lean meat, poultry, or fish °¨ ¼ cup cooked dry beans °¨ ½ oz nuts or 1 egg °¨ 1 tbs peanut butter °Milk °· Breastfeeding: 3 cups °· Non-Breastfeeding: 3 cups °· What Counts as a serving? °¨ 1 cup milk. °¨ 8 oz yogurt. °¨ 1 ½ oz cheese. °¨ 2 oz processed cheese. °TIPS FOR THE BREASTFEEDING MOM °· Rapid weight   loss is not suggested when you are breastfeeding. By simply breastfeeding, you will be able to lose the weight gained during your pregnancy. Your caregiver can keep track of your weight and tell you if your weight loss is appropriate.  Be sure to drink fluids. You may notice that you are thirstier than usual. A suggestion is to drink a glass of water or other beverage whenever you breastfeed.  Avoid alcohol as it can be passed into your breast milk.  Limit caffeine drinks to no more than 2 to 3 cups per day.  You may need to keep taking your prenatal vitamin while you are breastfeeding. Talk with your caregiver about taking a vitamin or supplement. RETURING TO A HEALTHY WEIGHT  The My Pyramid Plan for Moms will help you return to a healthy weight. It will also provide the nutrients you need.  You may need to limit "empty" calories. These include:  High fat foods like fried foods, fatty meats, fast food, butter, and mayonnaise.  High sugar foods like sodas, jelly, candy, and sweets.  Be physically active. Include 30 minutes of exercise or more each day. Choose an activity you like such as walking, swimming, biking, or aerobics. Check with your caregiver before you start to exercise. Document Released: 01/12/2008 Document Revised: 12/28/2011 Document Reviewed: 01/12/2008 [facility] Patient Information  2015 [place], [place]. This information is not intended to replace advice given to you by your health care provider. Make sure you discuss any questions you have with your health care provider.  Nutrition for the New Mother  A new mother needs good health and nutrition so she can have energy to take care of a new baby. Whether a mother breastfeeds or formula feeds the baby, it is important to have a well-balanced diet. Foods from all the food groups should be chosen to meet the new mother's energy needs and to give her the nutrients needed for repair and healing.  A HEALTHY EATING PLAN The My Pyramid plan for Moms outlines what you should eat to help you and your baby stay healthy. The energy and amount of food you need depends on whether or not you are breastfeeding. If you are breastfeeding you will need more nutrients. If you choose not to breastfeed, your nutrition goal should be to return to a healthy weight. Limiting calories may be needed if you are not breastfeeding.  HOME CARE INSTRUCTIONS   For a personal plan based on your unique needs, see your Registered Dietitian or visit [email or web address]-lambert.info/.  Eat a variety of foods. The plan below will help guide you. The following chart has a suggested daily meal plan from the My Pyramid for Moms.  Eat a variety of fruits and vegetables.  Eat more dark green and orange vegetables and cooked dried beans.  Make half your grains whole grains. Choose whole instead of refined grains.  Choose low-fat or lean meats and poultry.  Choose low-fat or fat-free dairy products like milk, cheese, or yogurt. Fruits  Breastfeeding: 2 cups  Non-Breastfeeding: 2 cups  What Counts as a serving?  1 cup of fruit or juice.   cup dried fruit. Vegetables  Breastfeeding: 3 cups  Non-Breastfeeding: 2  cups  What Counts as a serving?  1 cup raw or cooked vegetables.  Juice or 2 cups raw leafy vegetables. Grains  Breastfeeding: 8  oz  Non-Breastfeeding: 6 oz  What Counts as a serving?  1 slice bread.  1 oz ready-to-eat cereal.   cup cooked pasta, rice,  or cereal. Meat and Beans  Breastfeeding: 6  oz  Non-Breastfeeding: 5  oz  What Counts as a serving?  1 oz lean meat, poultry, or fish   cup cooked dry beans   oz nuts or 1 egg  1 tbs peanut butter Milk  Breastfeeding: 3 cups  Non-Breastfeeding: 3 cups  What Counts as a serving?  1 cup milk.  8 oz yogurt.  1  oz cheese.  2 oz processed cheese. TIPS FOR THE BREASTFEEDING MOM  Rapid weight loss is not suggested when you are breastfeeding. By simply breastfeeding, you will be able to lose the weight gained during your pregnancy. Your caregiver can keep track of your weight and tell you if your weight loss is appropriate.  Be sure to drink fluids. You may notice that you are thirstier than usual. A suggestion is to drink a glass of water or other beverage whenever you breastfeed.  Avoid alcohol as it can be passed into your breast milk.  Limit caffeine drinks to no more than 2 to 3 cups per day.  You may need to keep taking your prenatal vitamin while you are breastfeeding. Talk with your caregiver about taking a vitamin or supplement. RETURING TO A HEALTHY WEIGHT  The My Pyramid Plan for Moms will help you return to a healthy weight. It will also provide the nutrients you need.  You may need to limit "empty" calories. These include:  High fat foods like fried foods, fatty meats, fast food, butter, and mayonnaise.  High sugar foods like sodas, jelly, candy, and sweets.  Be physically active. Include 30 minutes of exercise or more each day. Choose an activity you like such as walking, swimming, biking, or aerobics. Check with your caregiver before you start to exercise. Document Released: 01/12/2008 Document Revised: 12/28/2011 Document Reviewed: 01/12/2008 [facility] Patient Information 2015 [place], [place]. This information is not  intended to replace advice given to you by your health care provider. Make sure you discuss any questions you have with your health care provider. Postpartum Depression and Baby Blues The postpartum period begins right after the birth of a baby. During this time, there is often a great amount of joy and excitement. It is also a time of many changes in the life of the parents. Regardless of how many times a mother gives birth, each child brings new challenges and dynamics to the family. It is not unusual to have feelings of excitement along with confusing shifts in moods, emotions, and thoughts. All mothers are at risk of developing postpartum depression or the "baby blues." These mood changes can occur right after giving birth, or they may occur many months after giving birth. The baby blues or postpartum depression can be mild or severe. Additionally, postpartum depression can go away rather quickly, or it can be a long-term condition.  CAUSES Raised hormone levels and the rapid drop in those levels are thought to be a main cause of postpartum depression and the baby blues. A number of hormones change during and after pregnancy. Estrogen and progesterone usually decrease right after the delivery of your baby. The levels of thyroid hormone and various cortisol steroids also rapidly drop. Other factors that play a role in these mood changes include major life events and genetics.  RISK FACTORS If you have any of the following risks for the baby blues or postpartum depression, know what symptoms to watch out for during the postpartum period. Risk factors that may increase the likelihood of getting the  baby blues or postpartum depression include:  Having a personal or family history of depression.   Having depression while being pregnant.   Having premenstrual mood issues or mood issues related to oral contraceptives.  Having a lot of life stress.   Having marital conflict.   Lacking a social  support network.   Having a baby with special needs.   Having health problems, such as diabetes.  SIGNS AND SYMPTOMS Symptoms of baby blues include:  Brief changes in mood, such as going from extreme happiness to sadness.  Decreased concentration.   Difficulty sleeping.   Crying spells, tearfulness.   Irritability.   Anxiety.  Symptoms of postpartum depression typically begin within the first month after giving birth. These symptoms include:  Difficulty sleeping or excessive sleepiness.   Marked weight loss.   Agitation.   Feelings of worthlessness.   Lack of interest in activity or food.  Postpartum psychosis is a very serious condition and can be dangerous. Fortunately, it is rare. Displaying any of the following symptoms is cause for immediate medical attention. Symptoms of postpartum psychosis include:   Hallucinations and delusions.   Bizarre or disorganized behavior.   Confusion or disorientation.  DIAGNOSIS  A diagnosis is made by an evaluation of your symptoms. There are no medical or lab tests that lead to a diagnosis, but there are various questionnaires that a health care provider may use to identify those with the baby blues, postpartum depression, or psychosis. Often, a screening tool called the [place] Postnatal Depression Scale is used to diagnose depression in the postpartum period.  TREATMENT The baby blues usually goes away on its own in 1-2 weeks. Social support is often all that is needed. You will be encouraged to get adequate sleep and rest. Occasionally, you may be given medicines to help you sleep.  Postpartum depression requires treatment because it can last several months or longer if it is not treated. Treatment may include individual or group therapy, medicine, or both to address any social, physiological, and psychological factors that may play a role in the depression. Regular exercise, a healthy diet, rest, and social support  may also be strongly recommended.  Postpartum psychosis is more serious and needs treatment right away. Hospitalization is often needed. HOME CARE INSTRUCTIONS  Get as much rest as you can. Nap when the baby sleeps.   Exercise regularly. Some women find yoga and walking to be beneficial.   Eat a balanced and nourishing diet.   Do little things that you enjoy. Have a cup of tea, take a bubble bath, read your favorite magazine, or listen to your favorite music.  Avoid alcohol.   Ask for help with household chores, cooking, grocery shopping, or running errands as needed. Do not try to do everything.   Talk to people close to you about how you are feeling. Get support from your partner, family members, friends, or other new moms.  Try to stay positive in how you think. Think about the things you are grateful for.   Do not spend a lot of time alone.   Only take over-the-counter or prescription medicine as directed by your health care provider.  Keep all your postpartum appointments.   Let your health care provider know if you have any concerns.  SEEK MEDICAL CARE IF: You are having a reaction to or problems with your medicine. SEEK IMMEDIATE MEDICAL CARE IF:  You have suicidal feelings.   You think you may harm the baby or someone  else. MAKE SURE YOU:  Understand these instructions.  Will watch your condition.  Will get help right away if you are not doing well or get worse. Document Released: 07/09/2004 Document Revised: 10/10/2013 Document Reviewed: 07/17/2013 [facility] Patient Information 2015 [place], [place]. This information is not intended to replace advice given to you by your health care provider. Make sure you discuss any questions you have with your health care provider. Breastfeeding and Mastitis Mastitis is inflammation of the breast tissue. It can occur in women who are breastfeeding. This can make breastfeeding painful. Mastitis will sometimes go away on  its own. Your health care provider will help determine if treatment is needed. CAUSES Mastitis is often associated with a blocked milk (lactiferous) duct. This can happen when too much milk builds up in the breast. Causes of excess milk in the breast can include:  Poor latch-on. If your baby is not latched onto the breast properly, she or he may not empty your breast completely while breastfeeding.  Allowing too much time to pass between feedings.  Wearing a bra or other clothing that is too tight. This puts extra pressure on the lactiferous ducts so milk does not flow through them as it should. Mastitis can also be caused by a bacterial infection. Bacteria may enter the breast tissue through cuts or openings in the skin. In women who are breastfeeding, this may occur because of cracked or irritated skin. Cracks in the skin are often caused when your baby does not latch on properly to the breast. SIGNS AND SYMPTOMS  Swelling, redness, tenderness, and pain in an area of the breast.  Swelling of the glands under the arm on the same side.  Fever may or may not accompany mastitis. If an infection is allowed to progress, a collection of pus (abscess) may develop. DIAGNOSIS  Your health care provider can usually diagnose mastitis based on your symptoms and a physical exam. Tests may be done to help confirm the diagnosis. These may include:  Removal of pus from the breast by applying pressure to the area. This pus can be examined in the lab to determine which bacteria are present. If an abscess has developed, the fluid in the abscess can be removed with a needle. This can also be used to confirm the diagnosis and determine the bacteria present. In most cases, pus will not be present.  Blood tests to determine if your body is fighting a bacterial infection.  Mammogram or ultrasound tests to rule out other problems or diseases. TREATMENT  Mastitis that occurs with breastfeeding will sometimes go  away on its own. Your health care provider may choose to wait 24 hours after first seeing you to decide whether a prescription medicine is needed. If your symptoms are worse after 24 hours, your health care provider will likely prescribe an antibiotic medicine to treat the mastitis. He or she will determine which bacteria are most likely causing the infection and will then select an appropriate antibiotic medicine. This is sometimes changed based on the results of tests performed to identify the bacteria, or if there is no response to the antibiotic medicine selected. Antibiotic medicines are usually given by mouth. You may also be given medicine for pain. HOME CARE INSTRUCTIONS  Only take over-the-counter or prescription medicines for pain, fever, or discomfort as directed by your health care provider.  If your health care provider prescribed an antibiotic medicine, take the medicine as directed. Make sure you finish it even if you start to  feel better.  Do not wear a tight or underwire bra. Wear a soft, supportive bra.  Increase your fluid intake, especially if you have a fever.  Continue to empty the breast. Your health care provider can tell you whether this milk is safe for your infant or needs to be thrown out. You may be told to stop nursing until your health care provider thinks it is safe for your baby. Use a breast pump if you are advised to stop nursing.  Keep your nipples clean and dry.  Empty the first breast completely before going to the other breast. If your baby is not emptying your breasts completely for some reason, use a breast pump to empty your breasts.  If you go back to work, pump your breasts while at work to stay in time with your nursing schedule.  Avoid allowing your breasts to become overly filled with milk (engorged). SEEK MEDICAL CARE IF:  You have pus-like discharge from the breast.  Your symptoms do not improve with the treatment prescribed by your health care  provider within 2 days. SEEK IMMEDIATE MEDICAL CARE IF:  Your pain and swelling are getting worse.  You have pain that is not controlled with medicine.  You have a red line extending from the breast toward your armpit.  You have a fever or persistent symptoms for more than 2-3 days.  You have a fever and your symptoms suddenly get worse. MAKE SURE YOU:   Understand these instructions.  Will watch your condition.  Will get help right away if you are not doing well or get worse. Document Released: 01/30/2005 Document Revised: 10/10/2013 Document Reviewed: 05/11/2013 [facility] Patient Information 2015 [place], [place]. This information is not intended to replace advice given to you by your health care provider. Make sure you discuss any questions you have with your health care provider. Breastfeeding Deciding to breastfeed is one of the best choices you can make for you and your baby. A change in hormones during pregnancy causes your breast tissue to grow and increases the number and size of your milk ducts. These hormones also allow proteins, sugars, and fats from your blood supply to make breast milk in your milk-producing glands. Hormones prevent breast milk from being released before your baby is born as well as prompt milk flow after birth. Once breastfeeding has begun, thoughts of your baby, as well as his or her sucking or crying, can stimulate the release of milk from your milk-producing glands.  BENEFITS OF BREASTFEEDING For Your Baby  Your first milk (colostrum) helps your baby's digestive system function better.   There are antibodies in your milk that help your baby fight off infections.   Your baby has a lower incidence of asthma, allergies, and sudden infant death syndrome.   The nutrients in breast milk are better for your baby than infant formulas and are designed uniquely for your baby's needs.   Breast milk improves your baby's brain development.   Your baby is  less likely to develop other conditions, such as childhood obesity, asthma, or type 2 diabetes mellitus.  For You   Breastfeeding helps to create a very special bond between you and your baby.   Breastfeeding is convenient. Breast milk is always available at the correct temperature and costs nothing.   Breastfeeding helps to burn calories and helps you lose the weight gained during pregnancy.   Breastfeeding makes your uterus contract to its prepregnancy size faster and slows bleeding (lochia) after you give  birth.   Breastfeeding helps to lower your risk of developing type 2 diabetes mellitus, osteoporosis, and breast or ovarian cancer later in life. SIGNS THAT YOUR BABY IS HUNGRY Early Signs of Hunger  Increased alertness or activity.  Stretching.  Movement of the head from side to side.  Movement of the head and opening of the mouth when the corner of the mouth or cheek is stroked (rooting).  Increased sucking sounds, smacking lips, cooing, sighing, or squeaking.  Hand-to-mouth movements.  Increased sucking of fingers or hands. Late Signs of Hunger  Fussing.  Intermittent crying. Extreme Signs of Hunger Signs of extreme hunger will require calming and consoling before your baby will be able to breastfeed successfully. Do not wait for the following signs of extreme hunger to occur before you initiate breastfeeding:   Restlessness.  A loud, strong cry.   Screaming. BREASTFEEDING BASICS Breastfeeding Initiation  Find a comfortable place to sit or lie down, with your neck and back well supported.  Place a pillow or rolled up blanket under your baby to bring him or her to the level of your breast (if you are seated). Nursing pillows are specially designed to help support your arms and your baby while you breastfeed.  Make sure that your baby's abdomen is facing your abdomen.   Gently massage your breast. With your fingertips, massage from your chest wall toward  your nipple in a circular motion. This encourages milk flow. You may need to continue this action during the feeding if your milk flows slowly.  Support your breast with 4 fingers underneath and your thumb above your nipple. Make sure your fingers are well away from your nipple and your baby's mouth.   Stroke your baby's lips gently with your finger or nipple.   When your baby's mouth is open wide enough, quickly bring your baby to your breast, placing your entire nipple and as much of the colored area around your nipple (areola) as possible into your baby's mouth.   More areola should be visible above your baby's upper lip than below the lower lip.   Your baby's tongue should be between his or her lower gum and your breast.   Ensure that your baby's mouth is correctly positioned around your nipple (latched). Your baby's lips should create a seal on your breast and be turned out (everted).  It is common for your baby to suck about 2-3 minutes in order to start the flow of breast milk. Latching Teaching your baby how to latch on to your breast properly is very important. An improper latch can cause nipple pain and decreased milk supply for you and poor weight gain in your baby. Also, if your baby is not latched onto your nipple properly, he or she may swallow some air during feeding. This can make your baby fussy. Burping your baby when you switch breasts during the feeding can help to get rid of the air. However, teaching your baby to latch on properly is still the best way to prevent fussiness from swallowing air while breastfeeding. Signs that your baby has successfully latched on to your nipple:    Silent tugging or silent sucking, without causing you pain.   Swallowing heard between every 3-4 sucks.    Muscle movement above and in front of his or her ears while sucking.  Signs that your baby has not successfully latched on to nipple:   Sucking sounds or smacking sounds from  your baby while breastfeeding.  Nipple pain.  If you think your baby has not latched on correctly, slip your finger into the corner of your baby's mouth to break the suction and place it between your baby's gums. Attempt breastfeeding initiation again. Signs of Successful Breastfeeding Signs from your baby:   A gradual decrease in the number of sucks or complete cessation of sucking.   Falling asleep.   Relaxation of his or her body.   Retention of a small amount of milk in his or her mouth.   Letting go of your breast by himself or herself. Signs from you:  Breasts that have increased in firmness, weight, and size 1-3 hours after feeding.   Breasts that are softer immediately after breastfeeding.  Increased milk volume, as well as a change in milk consistency and color by the fifth day of breastfeeding.   Nipples that are not sore, cracked, or bleeding. Signs That Your Pecola LeisureBaby is Getting Enough Milk  Wetting at least 3 diapers in a 24-hour period. The urine should be clear and pale yellow by age 34 days.  At least 3 stools in a 24-hour period by age 34 days. The stool should be soft and yellow.  At least 3 stools in a 24-hour period by age 68 days. The stool should be seedy and yellow.  No loss of weight greater than 10% of birth weight during the first 473 days of age.  Average weight gain of 4-7 ounces (113-198 g) per week after age 40 days.  Consistent daily weight gain by age 34 days, without weight loss after the age of 2 weeks. After a feeding, your baby may spit up a small amount. This is common. BREASTFEEDING FREQUENCY AND DURATION Frequent feeding will help you make more milk and can prevent sore nipples and breast engorgement. Breastfeed when you feel the need to reduce the fullness of your breasts or when your baby shows signs of hunger. This is called "breastfeeding on demand." Avoid introducing a pacifier to your baby while you are working to establish breastfeeding  (the first 4-6 weeks after your baby is born). After this time you may choose to use a pacifier. Research has shown that pacifier use during the first year of a baby's life decreases the risk of sudden infant death syndrome (SIDS). Allow your baby to feed on each breast as long as he or she wants. Breastfeed until your baby is finished feeding. When your baby unlatches or falls asleep while feeding from the first breast, offer the second breast. Because newborns are often sleepy in the first few weeks of life, you may need to awaken your baby to get him or her to feed. Breastfeeding times will vary from baby to baby. However, the following rules can serve as a guide to help you ensure that your baby is properly fed:  Newborns (babies 484 weeks of age or younger) may breastfeed every 1-3 hours.  Newborns should not go longer than 3 hours during the day or 5 hours during the night without breastfeeding.  You should breastfeed your baby a minimum of 8 times in a 24-hour period until you begin to introduce solid foods to your baby at around 546 months of age. BREAST MILK PUMPING Pumping and storing breast milk allows you to ensure that your baby is exclusively fed your breast milk, even at times when you are unable to breastfeed. This is especially important if you are going back to work while you are still breastfeeding or when you are not able to  be present during feedings. Your lactation consultant can give you guidelines on how long it is safe to store breast milk.  A breast pump is a machine that allows you to pump milk from your breast into a sterile bottle. The pumped breast milk can then be stored in a refrigerator or freezer. Some breast pumps are operated by hand, while others use electricity. Ask your lactation consultant which type will work best for you. Breast pumps can be purchased, but some hospitals and breastfeeding support groups lease breast pumps on a monthly basis. A lactation consultant can  teach you how to hand express breast milk, if you prefer not to use a pump.  CARING FOR YOUR BREASTS WHILE YOU BREASTFEED Nipples can become dry, cracked, and sore while breastfeeding. The following recommendations can help keep your breasts moisturized and healthy:  Avoid using soap on your nipples.   Wear a supportive bra. Although not required, special nursing bras and tank tops are designed to allow access to your breasts for breastfeeding without taking off your entire bra or top. Avoid wearing underwire-style bras or extremely tight bras.  Air dry your nipples for 3-62minutes after each feeding.   Use only cotton bra pads to absorb leaked breast milk. Leaking of breast milk between feedings is normal.   Use lanolin on your nipples after breastfeeding. Lanolin helps to maintain your skin's normal moisture barrier. If you use pure lanolin, you do not need to wash it off before feeding your baby again. Pure lanolin is not toxic to your baby. You may also hand express a few drops of breast milk and gently massage that milk into your nipples and allow the milk to air dry. In the first few weeks after giving birth, some women experience extremely full breasts (engorgement). Engorgement can make your breasts feel heavy, warm, and tender to the touch. Engorgement peaks within 3-5 days after you give birth. The following recommendations can help ease engorgement:  Completely empty your breasts while breastfeeding or pumping. You may want to start by applying warm, moist heat (in the shower or with warm water-soaked hand towels) just before feeding or pumping. This increases circulation and helps the milk flow. If your baby does not completely empty your breasts while breastfeeding, pump any extra milk after he or she is finished.  Wear a snug bra (nursing or regular) or tank top for 1-2 days to signal your body to slightly decrease milk production.  Apply ice packs to your breasts, unless this is  too uncomfortable for you.  Make sure that your baby is latched on and positioned properly while breastfeeding. If engorgement persists after 48 hours of following these recommendations, contact your health care provider or a Advertising copywriter. OVERALL HEALTH CARE RECOMMENDATIONS WHILE BREASTFEEDING  Eat healthy foods. Alternate between meals and snacks, eating 3 of each per day. Because what you eat affects your breast milk, some of the foods may make your baby more irritable than usual. Avoid eating these foods if you are sure that they are negatively affecting your baby.  Drink milk, fruit juice, and water to satisfy your thirst (about 10 glasses a day).   Rest often, relax, and continue to take your prenatal vitamins to prevent fatigue, stress, and anemia.  Continue breast self-awareness checks.  Avoid chewing and smoking tobacco.  Avoid alcohol and drug use. Some medicines that may be harmful to your baby can pass through breast milk. It is important to ask your health care provider before  taking any medicine, including all over-the-counter and prescription medicine as well as vitamin and herbal supplements. It is possible to become pregnant while breastfeeding. If birth control is desired, ask your health care provider about options that will be safe for your baby. SEEK MEDICAL CARE IF:   You feel like you want to stop breastfeeding or have become frustrated with breastfeeding.  You have painful breasts or nipples.  Your nipples are cracked or bleeding.  Your breasts are red, tender, or warm.  You have a swollen area on either breast.  You have a fever or chills.  You have nausea or vomiting.  You have drainage other than breast milk from your nipples.  Your breasts do not become full before feedings by the fifth day after you give birth.  You feel sad and depressed.  Your baby is too sleepy to eat well.  Your baby is having trouble sleeping.   Your baby is  wetting less than 3 diapers in a 24-hour period.  Your baby has less than 3 stools in a 24-hour period.  Your baby's skin or the white part of his or her eyes becomes yellow.   Your baby is not gaining weight by 64 days of age. SEEK IMMEDIATE MEDICAL CARE IF:   Your baby is overly tired (lethargic) and does not want to wake up and feed.  Your baby develops an unexplained fever. Document Released: 10/05/2005 Document Revised: 10/10/2013 Document Reviewed: 03/29/2013 [facility] Patient Information 2015 [place], [place]. This information is not intended to replace advice given to you by your health care provider. Make sure you discuss any questions you have with your health care provider.

## 2015-02-16 NOTE — Lactation Note (Signed)
This note was copied from the chart of Courtney NigeriaBrittany Givler. Lactation Consultation Note  Follow up visit made prior to discharge.  Mom states breastfeeding is going better.  She states baby latches for 30-45 minutes.  Mom pumped this AM and obtained about 10 mls which she gave to baby.  We discussed the importance of feeding baby on cue and making sure feedings are effective.  Recommended she use good breast massage and compression during feeding.  Instructed to post pump after feedings x 15-20 minutes and give any expressed milk back to baby.  She has a DEBP at home.  If baby does not latch or feeds poorly mom will pump and give EBM/formula if needed per volume guidelines per day of life.  Stressed importance of establishing and protecting milk supply.  Outpatient services and support encouraged prn.  Patient Name: Courtney [name] ZOXWR'UToday'[name] Date: 02/16/2015     Maternal Data    Feeding    LATCH Score/Interventions                      Lactation Tools Discussed/Used     Consult Status      [name], Courtney [name] 02/16/2015, 2:24 PM

## 2015-02-16 NOTE — Progress Notes (Signed)
PPD #2 - SVD  Subjective:   Reports feeling well Tolerating po/ No nausea or vomiting Bleeding is light Pain controlled with Motrin Up ad lib / ambulatory / voiding without problems Newborn: breastfeeding    Objective:   VS:  VS:  Filed Vitals:   02/14/15 1809 02/15/15 0612 02/15/15 1800 02/16/15 0517  BP: 128/75 112/71 144/92 128/85  Pulse: 97 104 94 96  Temp: 98.5 F (36.9 C) 98.3 F (36.8 C) 98.5 F (36.9 C) 98.2 F (36.8 C)  TempSrc: Oral Oral Oral Oral  Resp: 18 18 19 18   Height:      Weight:      SpO2:  100%      LABS:   Recent Labs  02/14/15 0840 02/15/15 0605  WBC 12.9* 13.2*  HGB 12.3 11.2*  PLT 159 162   Blood type: O POS (04/27 2010) Rubella: Immune (09/23 0000)    Physical Exam: Alert and oriented x3 Abdomen: soft, non-tender, non-distended  Fundus: firm, non-tender, U-2 Perineum: Well approximated, no significant erythema, edema, or drainage; healing well. Lochia: small Extremities: no edema, no calf pain or tenderness    Assessment:  PPD #2 / G1P1001 / S/P:spontaneous vaginal, labial laceration  Doing well    Plan: Continue routine post partum orders D/C Home   Raelyn MoraAWSON, Anamae Rochelle, Judie PetitM MSN, CNM 02/16/2015, 9:20 AM

## 2015-02-16 NOTE — Discharge Summary (Signed)
Obstetric Discharge Summary  Reason for Admission: Pt is a G1P1001 at 5131w1d IOL for SGA.  [facility] 25.5 wks (transferred prenatal care from [facility]), with Dr. Cherly Hensenousins as primary provider.  Medications on Admission: Prescriptions prior to admission  Medication Sig Dispense Refill Last Dose  . Prenatal Vit-Fe Fumarate-FA (PRENATAL MULTIVITAMIN) TABS tablet Take 1 tablet by mouth at bedtime.    02/12/2015 at Unknown time    Prenatal Labs: ABO, Rh: O POS (04/27 2010)  Antibody: NEG (04/27 2010) Rubella: Immune (09/23 0000)   RPR: Non Reactive (04/27 2010)  HBsAg: Negative (02/17 0000)  HIV: Non-reactive (02/17 0000)  GTT : Normal - 109 GBS: Negative (03/30 0000)   Prenatal Procedures: NST and ultrasound Intrapartum Course: Admitted for IOL for SGA / cervical balloon for induction / epidural for pain management / normal progression to complete dilation / SVD of viable female with a superficial LT labia minora laceration by Dr. Cherly Hensenousins / no immediate postpartum complications noted Intrapartum Procedures: spontaneous vaginal delivery Postpartum Procedures: none Complications-Operative and Postpartum: LT labia minora laceration  Labs: HEMOGLOBIN  Date Value Ref Range Status  02/15/2015 11.2* 12.0 - 15.0 g/dL Final   HCT  Date Value Ref Range Status  02/15/2015 32.6* 36.0 - 46.0 % Final   Lab Results  Component Value Date   PLT 162 02/15/2015    Newborn Data: Live born female  Birth Weight: 5 lb 10.8 oz (2575 g) APGAR: 9, 9  Home with mother.   Discharge Information: Date: 02/16/2015 Discharge Diagnoses:  Pt is a G1P1001 at 4831w1d S/P Term Pregnancy-delivered on 02/14/2015  Condition: stable Activity: pelvic rest Diet: routine Medications:    Medication List    TAKE these medications        ibuprofen 600 MG tablet  Commonly known as:  ADVIL,MOTRIN  Take 1 tablet (600 mg total) by mouth every 6 (six) hours.      prenatal multivitamin Tabs tablet  Take 1 tablet by mouth at bedtime.       Instructions: [facility] instruction book[facility] has been given and reviewed Discharge to: home     Follow-up Information    Follow up with Shreyansh Tiffany A, MD. Schedule an appointment as soon as possible for a visit in 6 weeks.   Specialty:  Obstetrics and Gynecology   Why:  postpartum visit   Contact information:   [address] [place] [id number] [phone]       Raelyn MoraAWSON, ROLITTA, Judie PetitM, MSN, CNM 02/16/2015, 9:24 AM

## 2015-06-19 ENCOUNTER — Ambulatory Visit (INDEPENDENT_AMBULATORY_CARE_PROVIDER_SITE_OTHER): Payer: Commercial Managed Care - HMO | Admitting: Family

## 2015-06-19 ENCOUNTER — Encounter: Payer: Self-pay | Admitting: Family

## 2015-06-19 VITALS — BP 140/98 | HR 75 | Temp 98.5°F | Wt 134.0 lb

## 2015-06-19 DIAGNOSIS — Z309 Encounter for contraceptive management, unspecified: Secondary | ICD-10-CM

## 2015-06-19 DIAGNOSIS — Z3009 Encounter for other general counseling and advice on contraception: Secondary | ICD-10-CM

## 2015-06-19 MED ORDER — NORETHINDRONE 0.35 MG PO TABS: 1.0000 | ORAL_TABLET | Freq: Every day | ORAL | Status: DC

## 2015-06-19 NOTE — Patient Instructions (Signed)
Contraception Choices Contraception (birth control) is the use of any methods or devices to prevent pregnancy. Below are some methods to help avoid pregnancy. HORMONAL METHODS   Contraceptive implant. This is a thin, plastic tube containing progesterone hormone. It does not contain estrogen hormone. Your health care provider inserts the tube in the inner part of the upper arm. The tube can remain in place for up to 3 years. After 3 years, the implant must be removed. The implant prevents the ovaries from releasing an egg (ovulation), thickens the cervical mucus to prevent sperm from entering the uterus, and thins the lining of the inside of the uterus.  Progesterone-only injections. These injections are given every 3 months by your health care provider to prevent pregnancy. This synthetic progesterone hormone stops the ovaries from releasing eggs. It also thickens cervical mucus and changes the uterine lining. This makes it harder for sperm to survive in the uterus.  Birth control pills. These pills contain estrogen and progesterone hormone. They work by preventing the ovaries from releasing eggs (ovulation). They also cause the cervical mucus to thicken, preventing the sperm from entering the uterus. Birth control pills are prescribed by a health care provider.Birth control pills can also be used to treat heavy periods.  Minipill. This type of birth control pill contains only the progesterone hormone. They are taken every day of each month and must be prescribed by your health care provider.  Birth control patch. The patch contains hormones similar to those in birth control pills. It must be changed once a week and is prescribed by a health care provider.  Vaginal ring. The ring contains hormones similar to those in birth control pills. It is left in the vagina for 3 weeks, removed for 1 week, and then a new one is put back in place. The patient must be comfortable inserting and removing the ring  from the vagina.A health care provider's prescription is necessary.  Emergency contraception. Emergency contraceptives prevent pregnancy after unprotected sexual intercourse. This pill can be taken right after sex or up to 5 days after unprotected sex. It is most effective the sooner you take the pills after having sexual intercourse. Most emergency contraceptive pills are available without a prescription. Check with your pharmacist. Do not use emergency contraception as your only form of birth control. BARRIER METHODS   Female condom. This is a thin sheath (latex or rubber) that is worn over the penis during sexual intercourse. It can be used with spermicide to increase effectiveness.  Female condom. This is a soft, loose-fitting sheath that is put into the vagina before sexual intercourse.  Diaphragm. This is a soft, latex, dome-shaped barrier that must be fitted by a health care provider. It is inserted into the vagina, along with a spermicidal jelly. It is inserted before intercourse. The diaphragm should be left in the vagina for 6 to 8 hours after intercourse.  Cervical cap. This is a round, soft, latex or plastic cup that fits over the cervix and must be fitted by a health care provider. The cap can be left in place for up to 48 hours after intercourse.  Sponge. This is a soft, circular piece of polyurethane foam. The sponge has spermicide in it. It is inserted into the vagina after wetting it and before sexual intercourse.  Spermicides. These are chemicals that kill or block sperm from entering the cervix and uterus. They come in the form of creams, jellies, suppositories, foam, or tablets. They do not require a   prescription. They are inserted into the vagina with an applicator before having sexual intercourse. The process must be repeated every time you have sexual intercourse. INTRAUTERINE CONTRACEPTION  Intrauterine device (IUD). This is a T-shaped device that is put in a woman's uterus  during a menstrual period to prevent pregnancy. There are 2 types:  Copper IUD. This type of IUD is wrapped in copper wire and is placed inside the uterus. Copper makes the uterus and fallopian tubes produce a fluid that kills sperm. It can stay in place for 10 years.  Hormone IUD. This type of IUD contains the hormone progestin (synthetic progesterone). The hormone thickens the cervical mucus and prevents sperm from entering the uterus, and it also thins the uterine lining to prevent implantation of a fertilized egg. The hormone can weaken or kill the sperm that get into the uterus. It can stay in place for 3-5 years, depending on which type of IUD is used. PERMANENT METHODS OF CONTRACEPTION  Female tubal ligation. This is when the woman's fallopian tubes are surgically sealed, tied, or blocked to prevent the egg from traveling to the uterus.  Hysteroscopic sterilization. This involves placing a small coil or insert into each fallopian tube. Your doctor uses a technique called hysteroscopy to do the procedure. The device causes scar tissue to form. This results in permanent blockage of the fallopian tubes, so the sperm cannot fertilize the egg. It takes about 3 months after the procedure for the tubes to become blocked. You must use another form of birth control for these 3 months.  Female sterilization. This is when the female has the tubes that carry sperm tied off (vasectomy).This blocks sperm from entering the vagina during sexual intercourse. After the procedure, the man can still ejaculate fluid (semen). NATURAL PLANNING METHODS  Natural family planning. This is not having sexual intercourse or using a barrier method (condom, diaphragm, cervical cap) on days the woman could become pregnant.  Calendar method. This is keeping track of the length of each menstrual cycle and identifying when you are fertile.  Ovulation method. This is avoiding sexual intercourse during ovulation.  Symptothermal  method. This is avoiding sexual intercourse during ovulation, using a thermometer and ovulation symptoms.  Post-ovulation method. This is timing sexual intercourse after you have ovulated. Regardless of which type or method of contraception you choose, it is important that you use condoms to protect against the transmission of sexually transmitted infections (STIs). Talk with your health care provider about which form of contraception is most appropriate for you. Document Released: 10/05/2005 Document Revised: 10/10/2013 Document Reviewed: 03/30/2013 [employer] Patient Information 2015 [employer]. This information is not intended to replace advice given to you by your health care provider. Make sure you discuss any questions you have with your health care provider.  

## 2015-06-19 NOTE — Progress Notes (Signed)
   Subjective:    Patient ID: Courtney [name], female    DOB: 1991/02/15, 24 y.o.   MRN: [id number]  HPI  24 year old female is in today for BCP. She is still breast feeding. She has a history of migraine headaches. None since delivering her baby who is now 81 months old.   Review of Systems  Constitutional: Negative.   HENT: Negative.   Respiratory: Negative.   Cardiovascular: Negative.   Gastrointestinal: Negative.   Endocrine: Negative.   Genitourinary: Negative.   Skin: Negative.   Psychiatric/Behavioral: Negative.    Past Medical History  Diagnosis Date  . Migraine   . Hypertension   . Postpartum care following vaginal delivery (4/28) 02/14/2015    Social History   Social History  . Marital Status: Married    Spouse Name: N/A  . Number of Children: N/A  . Years of Education: N/A   Occupational History  . Not on file.   Social History Main Topics  . Smoking status: Never Smoker   . Smokeless tobacco: Not on file  . Alcohol Use: No  . Drug Use: No  . Sexual Activity: Yes   Other Topics Concern  . Not on file   Social History Narrative    Past Surgical History  Procedure Laterality Date  . Wisdom tooth extraction      Family History  Problem Relation Age of Onset  . Diabetes Mother   . Kidney disease Father   . Hypertension Father   . Diabetes Maternal Uncle   . Diabetes Maternal Grandmother   . Cancer Paternal Grandfather     lung  . Autism Paternal Uncle     half-uncle    No Known Allergies  Current Outpatient Prescriptions on File Prior to Visit  Medication Sig Dispense Refill  . ibuprofen (ADVIL,MOTRIN) 600 MG tablet Take 1 tablet (600 mg total) by mouth every 6 (six) hours. 30 tablet 0  . Prenatal Vit-Fe Fumarate-FA (PRENATAL MULTIVITAMIN) TABS tablet Take 1 tablet by mouth at bedtime.      No current facility-administered medications on file prior to visit.    BP 140/98 mmHg  Pulse 75  Temp(Src) 98.5 F (36.9 C) (Oral)  Wt 134 lb  (60.782 kg)  LMP 10/19/2013 (Approximate)chart    Objective:   Physical Exam  Constitutional: She is oriented to person, place, and time. She appears well-developed and well-nourished.  Cardiovascular: Normal rate, regular rhythm and normal heart sounds.   Pulmonary/Chest: Effort normal and breath sounds normal.  Abdominal: Soft. Bowel sounds are normal.  Neurological: She is alert and oriented to person, place, and time.  Skin: Skin is warm and dry.  Psychiatric: She has a normal mood and affect.          Assessment & Plan:  Courtney [name] was seen today for contraception.  Diagnoses and all orders for this visit:  Birth control counseling -     POCT urine pregnancy  Other orders -     norethindrone (JOLIVETTE) 0.35 MG tablet; Take 1 tablet (0.35 mg total) by mouth daily.   Call the office with any questions or concerns. Recheck in 1 year and as needed.

## 2015-06-19 NOTE — Progress Notes (Signed)
Pre visit review using our clinic review tool, if applicable. No additional management support is needed unless otherwise documented below in the visit note. 

## 2015-09-20 ENCOUNTER — Telehealth: Payer: Self-pay | Admitting: Family

## 2015-09-20 NOTE — Telephone Encounter (Signed)
Patient Name: Courtney [name]  DOB: 1991/06/10    Initial Comment Caller states she woke up at 3am with eye swelling, had been having pain and itching before. Breastfeeds, wants to know what she can take.   Nurse Assessment  Nurse: Stefano GaulStringer, RN, Dwana CurdVera Date/Time Lamount Cohen(Eastern Time): 09/20/2015 11:06:17 AM  Confirm and document reason for call. If symptomatic, describe symptoms. ---Caller states she woke up at 3 am with both eyes swollen. She has dark rings under her eyes. Eyes are itching. No drainage. Eyes are red. She used a warm and cool compress and she is swollen around her eyes. No headache. She is breastfeeding.  Has the patient traveled out of the country within the last 30 days? ---Not Applicable  Does the patient have any new or worsening symptoms? ---Yes  Will a triage be completed? ---Yes  Related visit to physician within the last 2 weeks? ---No  Does the PT have any chronic conditions? (i.e. diabetes, asthma, etc.) ---No  Did the patient indicate they were pregnant? ---No  Is this a behavioral health or substance abuse call? ---No     Guidelines    Guideline Title Affirmed Question Affirmed Notes  Eye - Swelling [1] SEVERE eyelid swelling (i.e., shut or almost) AND [2] involves both eyes AND [2] itchy    Final Disposition User   See Physician within 24 Hours Stringer, RN, Dwana CurdVera    Comments  pt states she will try Loratadine today and will call the [facility] if not better by tomorrow.  [facility]. Advised caller that she can take Fexofenadine or Loratadine for allergy symptoms while she is breast feeding as per [email or web address]   Referrals  [facility]   Disagree/Comply: Comply

## 2015-09-20 NOTE — Telephone Encounter (Signed)
FYI noted.

## 2016-01-08 ENCOUNTER — Telehealth: Payer: Self-pay | Admitting: Family

## 2016-01-08 NOTE — Telephone Encounter (Signed)
[facility] - Client TELEPHONE ADVICE RECORD [facility]  Patient Name: Courtney [name]  DOB: 1991/08/19    Initial Comment Caller states c/o intermittent chest pain    Nurse Assessment  Nurse: Courtney RuePatten, RN, Courtney [name] Date/Time (Eastern Time): 01/08/2016 2:29:03 PM  Confirm and document reason for call. If symptomatic, describe symptoms. You must click the next button to save text entered. ---Caller has had chest pain that comes and goes for the past 2 weeks. She has not been evaluated for this  Has the patient traveled out of the country within the last 30 days? ---Not Applicable  Does the patient have any new or worsening symptoms? ---Yes  Will a triage be completed? ---Yes  Related visit to physician within the last 2 weeks? ---No  Does the PT have any chronic conditions? (i.e. diabetes, asthma, etc.) ---No  Is the patient pregnant or possibly pregnant? (Ask all females between the ages of 3912-55) ---No  Is this a behavioral health or substance abuse call? ---No     Guidelines    Guideline Title Affirmed Question Affirmed Notes  Chest Pain [1] Chest pain(s) lasting a few seconds AND [2] persists > 3 days    Final Disposition User   See PCP When Office is Open (within 3 days) Courtney RuePatten, RN, Courtney [name]    Comments  Scheduled with Shirline Freesory Nafziger at 3pm on 03/22   Referrals  REFERRED TO PCP OFFICE   Disagree/Comply: Comply

## 2016-01-08 NOTE — Telephone Encounter (Signed)
FYI- patient has an appointment. 

## 2016-01-08 NOTE — Telephone Encounter (Signed)
Noted. Will route to PCP to see if further follow up is needed.

## 2016-01-09 ENCOUNTER — Ambulatory Visit (INDEPENDENT_AMBULATORY_CARE_PROVIDER_SITE_OTHER): Payer: Commercial Managed Care - HMO | Admitting: Adult Health

## 2016-01-09 ENCOUNTER — Encounter: Payer: Self-pay | Admitting: Adult Health

## 2016-01-09 VITALS — BP 140/100 | HR 79 | Temp 98.3°F | Ht 62.0 in | Wt 133.7 lb

## 2016-01-09 DIAGNOSIS — R0789 Other chest pain: Secondary | ICD-10-CM

## 2016-01-09 DIAGNOSIS — F411 Generalized anxiety disorder: Secondary | ICD-10-CM | POA: Diagnosis not present

## 2016-01-09 DIAGNOSIS — I10 Essential (primary) hypertension: Secondary | ICD-10-CM

## 2016-01-09 MED ORDER — CITALOPRAM HYDROBROMIDE 20 MG PO TABS: 20.0000 mg | ORAL_TABLET | Freq: Every day | ORAL | Status: DC

## 2016-01-09 NOTE — Patient Instructions (Signed)
It was great meeting you today.  I have a feeling the high blood pressure and chest pain is stemming from anxiety.   I have sent in a prescription for Celexa 20 mg. Take this every morning.   I want you to start eating healthier and exercising AT LEAST three times a week for 45 minutes at a time.   Follow up with me in 6 weeks.

## 2016-01-09 NOTE — Progress Notes (Signed)
Pre visit review using our clinic review tool, if applicable. No additional management support is needed unless otherwise documented below in the visit note. 

## 2016-01-09 NOTE — Progress Notes (Signed)
Subjective:    Patient ID: Courtney [name], female    DOB: November 12, 1990, 25 y.o.   MRN: [id number]  HPI  This is a 25 year old pleasant American American female who presents to the office today for occasional midsternal and epigastric pain. She reports that in the past she's been diagnosed with hypertension, has never wanted to go on medications for hypertension. In the last year she's had a new baby, and had hypertension during the pregnancy, she reports blood pressure readings in the 150 systolic. Shortly she reports that she suffers from extreme anxiety, she feels as though this was the chest pain and possibly elevated blood pressures has stemmed from. Reports that along with the new addition to her family, her husband are also in the process of buying a new home.  He denies any eating chest pain, shortness of breath, blurred vision, or depressive thoughts.   Today in the office she denies any chest pain but feels anxious   Review of Systems  Constitutional: Negative.   HENT: Negative.   Respiratory: Negative for apnea, cough, choking, chest tightness, shortness of breath, wheezing and stridor.   Cardiovascular: Positive for chest pain and palpitations. Negative for leg swelling.  Gastrointestinal: Negative.   Skin: Negative.   Neurological: Negative.   Psychiatric/Behavioral: The patient is nervous/anxious.   All other systems reviewed and are negative.  Past Medical History  Diagnosis Date  . Migraine   . Hypertension   . Postpartum care following vaginal delivery (4/28) 02/14/2015    Social History   Social History  . Marital Status: Married    Spouse Name: N/A  . Number of Children: N/A  . Years of Education: N/A   Occupational History  . Not on file.   Social History Main Topics  . Smoking status: Never Smoker   . Smokeless tobacco: Not on file  . Alcohol Use: No  . Drug Use: No  . Sexual Activity: Yes   Other Topics Concern  . Not on file   Social History  Narrative    Past Surgical History  Procedure Laterality Date  . Wisdom tooth extraction      Family History  Problem Relation Age of Onset  . Diabetes Mother   . Kidney disease Father   . Hypertension Father   . Diabetes Maternal Uncle   . Diabetes Maternal Grandmother   . Cancer Paternal Grandfather     lung  . Autism Paternal Uncle     half-uncle    No Known Allergies  Current Outpatient Prescriptions on File Prior to Visit  Medication Sig Dispense Refill  . ibuprofen (ADVIL,MOTRIN) 600 MG tablet Take 1 tablet (600 mg total) by mouth every 6 (six) hours. 30 tablet 0  . norethindrone (JOLIVETTE) 0.35 MG tablet Take 1 tablet (0.35 mg total) by mouth daily. 1 Package 11  . Prenatal Vit-Fe Fumarate-FA (PRENATAL MULTIVITAMIN) TABS tablet Take 1 tablet by mouth at bedtime.      No current facility-administered medications on file prior to visit.    BP 140/100 mmHg  Pulse 79  Temp(Src) 98.3 F (36.8 C) (Oral)  Ht  (1.575 m)  Wt 133 lb 11.2 oz (60.646 kg)  BMI 24.45 kg/m2  SpO2 97%       Objective:   Physical Exam  Constitutional: She is oriented to person, place, and time. She appears well-developed and well-nourished. No distress.  Neck: Normal range of motion. Neck supple.  Cardiovascular: Normal rate, regular rhythm, normal heart sounds and intact  distal pulses.  Exam reveals no gallop and no friction rub.   No murmur heard. Pulmonary/Chest: Effort normal and breath sounds normal. No respiratory distress. She has no wheezes. She has no rales. She exhibits no tenderness.  Musculoskeletal: Normal range of motion. She exhibits no edema or tenderness.  No increased discomfort to the chest with palpation  Lymphadenopathy:    She has no cervical adenopathy.  Neurological: She is alert and oriented to person, place, and time. She has normal reflexes.  Skin: Skin is warm and dry. No rash noted. She is not diaphoretic. No erythema. No pallor.  Psychiatric: She has  a normal mood and affect. Her behavior is normal. Judgment and thought content normal.  Nursing note and vitals reviewed.     Assessment & Plan:  1. Other chest pain - EKG 12-Lead- NSR, Rate 86  2. Anxiety state - I feel as though most of her chest pain and hypertension stems from an anxiety state. Went to trial her on Celexa for the next 6 weeks, at that point she'll become in and be reevaluated. If she continues to have elevated blood pressure that I will start her on 5 mg lisinopril daily. - citalopram (CELEXA) 20 MG tablet; Take 1 tablet (20 mg total) by mouth daily.  Dispense: 30 tablet; Refill: 3 - EKG 12-Lead  3. Essential hypertension - citalopram (CELEXA) 20 MG tablet; Take 1 tablet (20 mg total) by mouth daily.  Dispense: 30 tablet; Refill: 3 - EKG 12-Lead

## 2016-02-20 ENCOUNTER — Ambulatory Visit: Payer: Commercial Managed Care - HMO | Admitting: Adult Health

## 2016-02-25 ENCOUNTER — Ambulatory Visit (INDEPENDENT_AMBULATORY_CARE_PROVIDER_SITE_OTHER): Payer: Commercial Managed Care - HMO | Admitting: Adult Health

## 2016-02-25 ENCOUNTER — Encounter: Payer: Self-pay | Admitting: Adult Health

## 2016-02-25 VITALS — BP 142/100 | Temp 98.1°F | Wt 132.2 lb

## 2016-02-25 DIAGNOSIS — I10 Essential (primary) hypertension: Secondary | ICD-10-CM | POA: Diagnosis not present

## 2016-02-25 MED ORDER — LISINOPRIL 5 MG PO TABS: 2.5000 mg | ORAL_TABLET | Freq: Every day | ORAL | Status: DC

## 2016-02-25 NOTE — Progress Notes (Signed)
Subjective:    Patient ID: Courtney [name], female    DOB: [birth year], 25 y.o.   MRN: [id number]  HPI  25 year old female who presents to the office today for follow up. I last saw her on 01/09/2016 for anxiety and hypertension. At that time, it was thought that her elevated blood pressure stemmed from anxiety. She was started on Celexa 20 mg  Today in the office she reports that she stopped taking celexa because it made her feel more anxious. She has not had any chest pain or anxiety attacks since seeing me last.   She also reports that she is no longer eating fast foods and is walking every night.     Review of Systems  Constitutional: Negative.   Respiratory: Negative.   Cardiovascular: Negative.   Neurological: Negative.   Psychiatric/Behavioral: Negative.   All other systems reviewed and are negative.  Past Medical History  Diagnosis Date  . Migraine   . Hypertension   . Postpartum care following vaginal delivery (4/28) 02/14/2015    Social History   Social History  . Marital Status: Married    Spouse Name: N/A  . Number of Children: N/A  . Years of Education: N/A   Occupational History  . Not on file.   Social History Main Topics  . Smoking status: Never Smoker   . Smokeless tobacco: Not on file  . Alcohol Use: No  . Drug Use: No  . Sexual Activity: Yes   Other Topics Concern  . Not on file   Social History Narrative    Past Surgical History  Procedure Laterality Date  . Wisdom tooth extraction      Family History  Problem Relation Age of Onset  . Diabetes Mother   . Kidney disease Father   . Hypertension Father   . Diabetes Maternal Uncle   . Diabetes Maternal Grandmother   . Cancer Paternal Grandfather     lung  . Autism Paternal Uncle     half-uncle    No Known Allergies  Current Outpatient Prescriptions on File Prior to Visit  Medication Sig Dispense Refill  . ibuprofen (ADVIL,MOTRIN) 600 MG tablet Take 1 tablet (600 mg total) by  mouth every 6 (six) hours. 30 tablet 0  . norethindrone (JOLIVETTE) 0.35 MG tablet Take 1 tablet (0.35 mg total) by mouth daily. 1 Package 11  . Prenatal Vit-Fe Fumarate-FA (PRENATAL MULTIVITAMIN) TABS tablet Take 1 tablet by mouth at bedtime.     . citalopram (CELEXA) 20 MG tablet Take 1 tablet (20 mg total) by mouth daily. (Patient not taking: Reported on 02/25/2016) 30 tablet 3   No current facility-administered medications on file prior to visit.    BP 142/100 mmHg  Temp(Src) 98.1 F (36.7 C) (Oral)  Wt 132 lb 3.2 oz (59.966 kg)       Objective:   Physical Exam  Constitutional: She is oriented to person, place, and time. She appears well-developed and well-nourished. No distress.  Cardiovascular: Normal rate, regular rhythm, normal heart sounds and intact distal pulses.  Exam reveals no friction rub.   No murmur heard. Pulmonary/Chest: Effort normal and breath sounds normal. No respiratory distress. She has no wheezes. She has no rales. She exhibits no tenderness.  Neurological: She is alert and oriented to person, place, and time.  Skin: Skin is warm and dry. No rash noted. She is not diaphoretic. No erythema. No pallor.  Psychiatric: She has a normal mood and affect. Her behavior is normal.  Judgment and thought content normal.  Nursing note and vitals reviewed.      Assessment & Plan:  1. Essential hypertension - lisinopril (PRINIVIL,ZESTRIL) 5 MG tablet; Take 0.5 tablets (2.5 mg total) by mouth daily.  Dispense: 30 tablet; Refill: 3 - Follow up in two weeks - Continue to eat healthy and increase exercise  - Advised about side effects of lisinopril, including dry cough and angioedema.   Shirline Freesory Warren Lindahl, NP

## 2016-02-25 NOTE — Patient Instructions (Addendum)
It was great seeing you again!  I have sent in a prescription for Lisinopril, this is for high blood pressure. Take one half a tablet. Monitor blood pressure at home and bring with you to the next appointment.   Follow up in two weeks.   Hypertension Hypertension, commonly called high blood pressure, is when the force of blood pumping through your arteries is too strong. Your arteries are the blood vessels that carry blood from your heart throughout your body. A blood pressure reading consists of a higher number over a lower number, such as 110/72. The higher number (systolic) is the pressure inside your arteries when your heart pumps. The lower number (diastolic) is the pressure inside your arteries when your heart relaxes. Ideally you want your blood pressure below 120/80. Hypertension forces your heart to work harder to pump blood. Your arteries may become narrow or stiff. Having untreated or uncontrolled hypertension can cause heart attack, stroke, kidney disease, and other problems. RISK FACTORS Some risk factors for high blood pressure are controllable. Others are not.  Risk factors you cannot control include:   Race. You may be at higher risk if you are African American.  Age. Risk increases with age.  Gender. Men are at higher risk than women before age 30 years. After age 13, women are at higher risk than men. Risk factors you can control include:  Not getting enough exercise or physical activity.  Being overweight.  Getting too much fat, sugar, calories, or salt in your diet.  Drinking too much alcohol. SIGNS AND SYMPTOMS Hypertension does not usually cause signs or symptoms. Extremely high blood pressure (hypertensive crisis) may cause headache, anxiety, shortness of breath, and nosebleed. DIAGNOSIS To check if you have hypertension, your health care provider will measure your blood pressure while you are seated, with your arm held at the level of your heart. It should be  measured at least twice using the same arm. Certain conditions can cause a difference in blood pressure between your right and left arms. A blood pressure reading that is higher than normal on one occasion does not mean that you need treatment. If it is not clear whether you have high blood pressure, you may be asked to return on a different day to have your blood pressure checked again. Or, you may be asked to monitor your blood pressure at home for 1 or more weeks. TREATMENT Treating high blood pressure includes making lifestyle changes and possibly taking medicine. Living a healthy lifestyle can help lower high blood pressure. You may need to change some of your habits. Lifestyle changes may include:  Following the DASH diet. This diet is high in fruits, vegetables, and whole grains. It is low in salt, red meat, and added sugars.  Keep your sodium intake below 2,300 mg per day.  Getting at least 30-45 minutes of aerobic exercise at least 4 times per week.  Losing weight if necessary.  Not smoking.  Limiting alcoholic beverages.  Learning ways to reduce stress. Your health care provider may prescribe medicine if lifestyle changes are not enough to get your blood pressure under control, and if one of the following is true:  You are 60-68 years of age and your systolic blood pressure is above 140.  You are 47 years of age or older, and your systolic blood pressure is above 150.  Your diastolic blood pressure is above 90.  You have diabetes, and your systolic blood pressure is over 140 or your diastolic blood  pressure is over 90.  You have kidney disease and your blood pressure is above 140/90.  You have heart disease and your blood pressure is above 140/90. Your personal target blood pressure may vary depending on your medical conditions, your age, and other factors. HOME CARE INSTRUCTIONS  Have your blood pressure rechecked as directed by your health care provider.   Take  medicines only as directed by your health care provider. Follow the directions carefully. Blood pressure medicines must be taken as prescribed. The medicine does not work as well when you skip doses. Skipping doses also puts you at risk for problems.  Do not smoke.   Monitor your blood pressure at home as directed by your health care provider. SEEK MEDICAL CARE IF:   You think you are having a reaction to medicines taken.  You have recurrent headaches or feel dizzy.  You have swelling in your ankles.  You have trouble with your vision. SEEK IMMEDIATE MEDICAL CARE IF:  You develop a severe headache or confusion.  You have unusual weakness, numbness, or feel faint.  You have severe chest or abdominal pain.  You vomit repeatedly.  You have trouble breathing. MAKE SURE YOU:   Understand these instructions.  Will watch your condition.  Will get help right away if you are not doing well or get worse.   This information is not intended to replace advice given to you by your health care provider. Make sure you discuss any questions you have with your health care provider.   Document Released: 10/05/2005 Document Revised: 02/19/2015 Document Reviewed: 07/28/2013 Elsevier Interactive Patient Education [employer]2016 Elsevier Inc.

## 2016-03-10 ENCOUNTER — Ambulatory Visit (INDEPENDENT_AMBULATORY_CARE_PROVIDER_SITE_OTHER): Payer: Commercial Managed Care - HMO | Admitting: Adult Health

## 2016-03-10 ENCOUNTER — Encounter: Payer: Self-pay | Admitting: Adult Health

## 2016-03-10 VITALS — BP 130/88 | Temp 98.1°F | Ht 62.0 in | Wt 130.1 lb

## 2016-03-10 DIAGNOSIS — I10 Essential (primary) hypertension: Secondary | ICD-10-CM

## 2016-03-10 NOTE — Progress Notes (Signed)
Subjective:    Patient ID: Courtney [name], female    DOB: 1991/09/25, 25 y.o.   MRN: [id number]  HPI  25 year old female who presents to the office today for follow up regarding hypertension. I last saw her two weeks ago at which time we started her on lisinopril 2.5 mg.   Today in the office she reports that her blood pressure is much more controlled and she is no longer having palpitations.   BP Readings from Last 3 Encounters:  03/10/16 130/88  02/25/16 142/100  01/09/16 140/100   She denies any lightheadedness or dizziness.   Review of Systems  Constitutional: Negative.   Respiratory: Negative.   Cardiovascular: Negative.   Neurological: Negative.   Hematological: Negative.    Past Medical History  Diagnosis Date  . Migraine   . Hypertension   . Postpartum care following vaginal delivery (4/28) 02/14/2015    Social History   Social History  . Marital Status: Married    Spouse Name: N/A  . Number of Children: N/A  . Years of Education: N/A   Occupational History  . Not on file.   Social History Main Topics  . Smoking status: Never Smoker   . Smokeless tobacco: Not on file  . Alcohol Use: No  . Drug Use: No  . Sexual Activity: Yes   Other Topics Concern  . Not on file   Social History Narrative    Past Surgical History  Procedure Laterality Date  . Wisdom tooth extraction      Family History  Problem Relation Age of Onset  . Diabetes Mother   . Kidney disease Father   . Hypertension Father   . Diabetes Maternal Uncle   . Diabetes Maternal Grandmother   . Cancer Paternal Grandfather     lung  . Autism Paternal Uncle     half-uncle    No Known Allergies  Current Outpatient Prescriptions on File Prior to Visit  Medication Sig Dispense Refill  . ibuprofen (ADVIL,MOTRIN) 600 MG tablet Take 1 tablet (600 mg total) by mouth every 6 (six) hours. 30 tablet 0  . lisinopril (PRINIVIL,ZESTRIL) 5 MG tablet Take 0.5 tablets (2.5 mg total) by mouth  daily. 30 tablet 3  . norethindrone (JOLIVETTE) 0.35 MG tablet Take 1 tablet (0.35 mg total) by mouth daily. 1 Package 11  . Prenatal Vit-Fe Fumarate-FA (PRENATAL MULTIVITAMIN) TABS tablet Take 1 tablet by mouth at bedtime.     . citalopram (CELEXA) 20 MG tablet Take 1 tablet (20 mg total) by mouth daily. (Patient not taking: Reported on 02/25/2016) 30 tablet 3   No current facility-administered medications on file prior to visit.    BP 130/88 mmHg  Temp(Src) 98.1 F (36.7 C) (Oral)  Ht  (1.575 m)  Wt 130 lb 1.6 oz (59.013 kg)  BMI 23.79 kg/m2       Objective:   Physical Exam  Constitutional: She is oriented to person, place, and time. She appears well-developed and well-nourished. No distress.  Cardiovascular: Normal rate, regular rhythm, normal heart sounds and intact distal pulses.  Exam reveals no gallop and no friction rub.   No murmur heard. Pulmonary/Chest: Effort normal and breath sounds normal. No respiratory distress. She has no wheezes. She has no rales. She exhibits no tenderness.  Neurological: She is alert and oriented to person, place, and time.  Skin: Skin is warm and dry. No rash noted. She is not diaphoretic. No erythema. No pallor.  Psychiatric: She has a  normal mood and affect. Her behavior is normal. Judgment and thought content normal.  Vitals reviewed.      Assessment & Plan:  1. Essential hypertension - Almost at goal. I am going to have her go up on lisinopril to 5 mg - Send me blood pressure reading in two weeks.  - Consider changing medication at that time if not at goal   Shirline Freesory Elmer Merwin, NP

## 2016-04-01 ENCOUNTER — Other Ambulatory Visit: Payer: Self-pay | Admitting: Family

## 2016-04-01 NOTE — Telephone Encounter (Signed)
Refilled 03/04/16 for #30 with 5 refills. Patient should not need refills.  I called and spoke with the pharmacy - and they have refilled previously for Lisinopril 5mg . I need to clarify with patient, and left message for patient to return phone call.

## 2016-04-01 NOTE — Telephone Encounter (Signed)
Pt takes lisinopril 5 mg instead of 2.5 mg and needs new rx #30  call into [facility]

## 2016-04-02 NOTE — Telephone Encounter (Signed)
Left message for patient to return phone call.  

## 2016-04-03 ENCOUNTER — Other Ambulatory Visit: Payer: Self-pay | Admitting: *Deleted

## 2016-04-03 DIAGNOSIS — I10 Essential (primary) hypertension: Secondary | ICD-10-CM

## 2016-04-03 MED ORDER — LISINOPRIL 5 MG PO TABS: 5.0000 mg | ORAL_TABLET | Freq: Every day | ORAL | Status: DC

## 2016-04-03 NOTE — Telephone Encounter (Signed)
Sent refill for Lisinopril 5mg , #30 with 3 refills to phamacy with the correct sigs. Left msg on patients machine to check the pharmacy.

## 2016-05-19 ENCOUNTER — Other Ambulatory Visit: Payer: Self-pay | Admitting: Family

## 2016-05-21 ENCOUNTER — Other Ambulatory Visit: Payer: Self-pay | Admitting: General Practice

## 2016-05-21 MED ORDER — NORETHINDRONE 0.35 MG PO TABS: 1.0000 | ORAL_TABLET | Freq: Every day | ORAL | 11 refills | Status: DC

## 2016-05-21 NOTE — Telephone Encounter (Signed)
Patient has appointment with you on 8/8.  Are you OK with re-filling this?  Please advise.

## 2016-05-21 NOTE — Telephone Encounter (Signed)
Ok to refill for one year  

## 2016-05-26 ENCOUNTER — Ambulatory Visit: Payer: Commercial Managed Care - HMO | Admitting: Adult Health

## 2016-07-08 ENCOUNTER — Other Ambulatory Visit (HOSPITAL_COMMUNITY)
Admission: RE | Admit: 2016-07-08 | Discharge: 2016-07-08 | Disposition: A | Discharge status: Home / Self Care | Payer: Commercial Managed Care - HMO | Source: Ambulatory Visit | Attending: Adult Health | Admitting: Adult Health

## 2016-07-08 ENCOUNTER — Encounter: Payer: Self-pay | Admitting: Adult Health

## 2016-07-08 ENCOUNTER — Ambulatory Visit (INDEPENDENT_AMBULATORY_CARE_PROVIDER_SITE_OTHER): Payer: Commercial Managed Care - HMO | Admitting: Adult Health

## 2016-07-08 VITALS — BP 120/84 | Temp 98.1°F | Ht 62.0 in | Wt 126.0 lb

## 2016-07-08 DIAGNOSIS — N760 Acute vaginitis: Secondary | ICD-10-CM | POA: Insufficient documentation

## 2016-07-08 DIAGNOSIS — I10 Essential (primary) hypertension: Secondary | ICD-10-CM | POA: Diagnosis not present

## 2016-07-08 DIAGNOSIS — Z0000 Encounter for general adult medical examination without abnormal findings: Secondary | ICD-10-CM

## 2016-07-08 DIAGNOSIS — Z113 Encounter for screening for infections with a predominantly sexual mode of transmission: Secondary | ICD-10-CM | POA: Diagnosis not present

## 2016-07-08 LAB — BASIC METABOLIC PANEL
BUN: 7 mg/dL (ref 6–23)
CALCIUM: 8.9 mg/dL (ref 8.4–10.5)
CO2: 26 meq/L (ref 19–32)
Chloride: 102 mEq/L (ref 96–112)
Creatinine, Ser: 0.76 mg/dL (ref 0.40–1.20)
GFR: 120.25 mL/min (ref >60.00)
Glucose, Bld: 88 mg/dL (ref 70–99)
POTASSIUM: 3.7 meq/L (ref 3.5–5.1)
SODIUM: 137 meq/L (ref 135–145)

## 2016-07-08 LAB — CBC WITH DIFFERENTIAL/PLATELET
BASOS ABS: 0 10*3/uL (ref 0.0–0.1)
Basophils Relative: 0.5 % (ref 0.0–3.0)
EOS PCT: 2 % (ref 0.0–5.0)
Eosinophils Absolute: 0.2 10*3/uL (ref 0.0–0.7)
HEMATOCRIT: 37.8 % (ref 36.0–46.0)
Hemoglobin: 13.1 g/dL (ref 12.0–15.0)
LYMPHS PCT: 18.2 % (ref 12.0–46.0)
Lymphs Abs: 2 10*3/uL (ref 0.7–4.0)
MCHC: 34.6 g/dL (ref 30.0–36.0)
MCV: 84.1 fl (ref 78.0–100.0)
MONOS PCT: 9.3 % (ref 3.0–12.0)
Monocytes Absolute: 1 10*3/uL (ref 0.1–1.0)
Neutro Abs: 7.5 10*3/uL (ref 1.4–7.7)
Neutrophils Relative %: 70 % (ref 43.0–77.0)
Platelets: 257 10*3/uL (ref 150.0–400.0)
RBC: 4.5 Mil/uL (ref 3.87–5.11)
RDW: 12.9 % (ref 11.5–15.5)
WBC: 10.7 10*3/uL — ABNORMAL HIGH (ref 4.0–10.5)

## 2016-07-08 LAB — POC URINALSYSI DIPSTICK (AUTOMATED)
GLUCOSE UA: NEGATIVE
Ketones, UA: NEGATIVE
Leukocytes, UA: NEGATIVE
NITRITE UA: NEGATIVE
SPEC GRAV UA: 1.025
UROBILINOGEN UA: 0.2
pH, UA: 6

## 2016-07-08 LAB — HEPATIC FUNCTION PANEL
ALBUMIN: 4.2 g/dL (ref 3.5–5.2)
ALK PHOS: 78 U/L (ref 39–117)
ALT: 8 U/L (ref 0–35)
AST: 12 U/L (ref 0–37)
Bilirubin, Direct: 0.1 mg/dL (ref 0.0–0.3)
TOTAL PROTEIN: 7.8 g/dL (ref 6.0–8.3)
Total Bilirubin: 0.4 mg/dL (ref 0.2–1.2)

## 2016-07-08 LAB — LIPID PANEL
CHOLESTEROL: 190 mg/dL (ref 0–200)
HDL: 54.7 mg/dL (ref >39.00)
LDL Cholesterol: 119 mg/dL — ABNORMAL HIGH (ref 0–99)
NONHDL: 135.34
Total CHOL/HDL Ratio: 3
Triglycerides: 82 mg/dL (ref 0.0–149.0)
VLDL: 16.4 mg/dL (ref 0.0–40.0)

## 2016-07-08 LAB — TSH: TSH: 1.38 u[IU]/mL (ref 0.35–4.50)

## 2016-07-08 NOTE — Progress Notes (Signed)
Patient presents to clinic today to establish care. She is a pleasant 25 year old AA female who  has a past medical history of Hypertension; Migraine; and Postpartum care following vaginal delivery (4/28) (02/14/2015).  Unsure of when her last physical was   Acute Concerns: Complete physical  Chronic Issues: Hypertension  - she reports that she has been checking her blood pressure at home. With her monitor her blood pressure has been in the 107/70's. She reports that her life has calmed down and she has bought a home   Health Maintenance: Dental -- Does not do routine care  Vision -- Does not do to routine care  Immunizations -- needs flu  PAP -- One year ago  Diet: She eats a lot of fruits and vegetables and lean meats. She is not eating red meat and pork.  Exercise: No formal exercise   Is followed by  GYN   Past Medical History:  Diagnosis Date  . Hypertension   . Migraine   . Postpartum care following vaginal delivery (4/28) 02/14/2015    Past Surgical History:  Procedure Laterality Date  . WISDOM TOOTH EXTRACTION      Current Outpatient Prescriptions on File Prior to Visit  Medication Sig Dispense Refill  . ibuprofen (ADVIL,MOTRIN) 600 MG tablet Take 1 tablet (600 mg total) by mouth every 6 (six) hours. 30 tablet 0  . lisinopril (PRINIVIL,ZESTRIL) 5 MG tablet Take 1 tablet (5 mg total) by mouth daily. 30 tablet 3  . norethindrone (JOLIVETTE) 0.35 MG tablet Take 1 tablet (0.35 mg total) by mouth daily. 1 Package 11   No current facility-administered medications on file prior to visit.     No Known Allergies  Family History  Problem Relation Age of Onset  . Diabetes Mother   . Kidney disease Father   . Hypertension Father   . Diabetes Maternal Uncle   . Diabetes Maternal Grandmother   . Cancer Paternal Grandfather     lung  . Autism Paternal Uncle     half-uncle ( husbands brother)   . Hypertension Maternal Grandfather   . Heart attack Maternal  Grandfather   . Stroke Maternal Grandfather     Social History   Social History  . Marital status: Married    Spouse name: N/A  . Number of children: N/A  . Years of education: N/A   Occupational History  . Not on file.   Social History Main Topics  . Smoking status: Never Smoker  . Smokeless tobacco: Not on file  . Alcohol use No  . Drug use: No  . Sexual activity: Yes   Other Topics Concern  . 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   HENT: Negative.   Eyes: Negative.   Respiratory: Negative.   Cardiovascular: Negative.   Gastrointestinal: Negative.   Genitourinary: Negative.   Musculoskeletal: Negative.   Skin: Negative.   Neurological: Negative.   Endo/Heme/Allergies: Negative.   Psychiatric/Behavioral: Negative.   All other systems reviewed and are negative.   BP 120/84   Temp 98.1 F (36.7 C) (Oral)   Ht 5\' 2"  (1.575 m)   Wt 126 lb (57.2 kg)   BMI 23.05 kg/m   Physical Exam  Constitutional: She is oriented to person, place, and time and well-developed, well-nourished, and in no distress. No distress.  HENT:  Head: Normocephalic and atraumatic.  Right Ear: External ear normal.  Left Ear: External ear normal.  Nose: Nose normal.  Mouth/Throat: Oropharynx is clear and moist. No oropharyngeal exudate.  Eyes: Conjunctivae and EOM are normal. Pupils are equal, round, and reactive to light. Right eye exhibits no discharge. Left eye exhibits no discharge. No scleral icterus.  Neck: Normal range of motion. Neck supple. No JVD present. No tracheal deviation present. No thyromegaly present.  Cardiovascular: Normal rate, regular rhythm, normal heart sounds and intact distal pulses.  Exam reveals no gallop and no friction rub.   No murmur heard. Pulmonary/Chest: Effort normal and breath sounds normal. No stridor. No respiratory distress. She has no wheezes. She has no  rales. She exhibits no tenderness.  Abdominal: Soft. Bowel sounds are normal. She exhibits no distension and no mass. There is no tenderness. There is no rebound and no guarding.  Genitourinary:  Genitourinary Comments: Refused  Musculoskeletal: Normal range of motion. She exhibits no edema, tenderness or deformity.  Lymphadenopathy:    She has no cervical adenopathy.  Neurological: She is alert and oriented to person, place, and time. She has normal reflexes. She displays normal reflexes. No cranial nerve deficit. She exhibits normal muscle tone. Gait normal. Coordination normal. GCS score is 15.  Skin: Skin is warm and dry. No rash noted. She is not diaphoretic. No erythema. No pallor.  Psychiatric: Mood, memory, affect and judgment normal.  Vitals reviewed.   Assessment/Plan: 1. Routine general medical examination at a health care facility - Follow up in one year for next CPE - Follow up sooner if needed - POCT Urinalysis Dipstick (Automated) - Basic metabolic panel - CBC with Differential/Platelet - Hepatic function panel - Lipid panel - TSH - HIV antibody - Urine cytology ancillary only - RPR - HSV(herpes smplx)abs-1+2(IgG+IgM)-bld - Educated on the importance of healthy eating and regular exercise 2. Essential hypertension - She can start weaning back on lisinopril. Keep me up to date on her readings. I am hopeful she can come off this medicaiton  - Continue to eat healthy and exercise  Shirline Frees, NP

## 2016-07-08 NOTE — Patient Instructions (Addendum)
It was great seeing you today!  You can start cutting back on blood pressure medication. Monitor your BP at home and let me know how they trend  I will follow up with you when I get your labs back  Health Maintenance, Female Adopting a healthy lifestyle and getting preventive care can go a long way to promote health and wellness. Talk with your health care provider about what schedule of regular examinations is right for you. This is a good chance for you to check in with your provider about disease prevention and staying healthy. In between checkups, there are plenty of things you can do on your own. Experts have done a lot of research about which lifestyle changes and preventive measures are most likely to keep you healthy. Ask your health care provider for more information. WEIGHT AND DIET  Eat a healthy diet  Be sure to include plenty of vegetables, fruits, low-fat dairy products, and lean protein.  Do not eat a lot of foods high in solid fats, added sugars, or salt.  Get regular exercise. This is one of the most important things you can do for your health.  Most adults should exercise for at least 150 minutes each week. The exercise should increase your heart rate and make you sweat (moderate-intensity exercise).  Most adults should also do strengthening exercises at least twice a week. This is in addition to the moderate-intensity exercise.  Maintain a healthy weight  Body mass index (BMI) is a measurement that can be used to identify possible weight problems. It estimates body fat based on height and weight. Your health care provider can help determine your BMI and help you achieve or maintain a healthy weight.  For females 47 years of age and older:   A BMI below 18.5 is considered underweight.  A BMI of 18.5 to 24.9 is normal.  A BMI of 25 to 29.9 is considered overweight.  A BMI of 30 and above is considered obese.  Watch levels of cholesterol and blood lipids  You  should start having your blood tested for lipids and cholesterol at 24 years of age, then have this test every 5 years.  You may need to have your cholesterol levels checked more often if:  Your lipid or cholesterol levels are high.  You are older than 24 years of age.  You are at high risk for heart disease.  CANCER SCREENING   Lung Cancer  Lung cancer screening is recommended for adults 68-25 years old who are at high risk for lung cancer because of a history of smoking.  A yearly low-dose CT scan of the lungs is recommended for people who:  Currently smoke.  Have quit within the past 15 years.  Have at least a 30-pack-year history of smoking. A pack year is smoking an average of one pack of cigarettes a day for 1 year.  Yearly screening should continue until it has been 15 years since you quit.  Yearly screening should stop if you develop a health problem that would prevent you from having lung cancer treatment.  Breast Cancer  Practice breast self-awareness. This means understanding how your breasts normally appear and feel.  It also means doing regular breast self-exams. Let your health care provider know about any changes, no matter how small.  If you are in your 20s or 30s, you should have a clinical breast exam (CBE) by a health care provider every 1-3 years as part of a regular health exam.  If you are 40 or older, have a CBE every year. Also consider having a breast X-ray (mammogram) every year.  If you have a family history of breast cancer, talk to your health care provider about genetic screening.  If you are at high risk for breast cancer, talk to your health care provider about having an MRI and a mammogram every year.  Breast cancer gene (BRCA) assessment is recommended for women who have family members with BRCA-related cancers. BRCA-related cancers include:  Breast.  Ovarian.  Tubal.  Peritoneal cancers.  Results of the assessment will determine  the need for genetic counseling and BRCA1 and BRCA2 testing. Cervical Cancer Your health care provider may recommend that you be screened regularly for cancer of the pelvic organs (ovaries, uterus, and vagina). This screening involves a pelvic examination, including checking for microscopic changes to the surface of your cervix (Pap test). You may be encouraged to have this screening done every 3 years, beginning at age 21.  For women ages 30-65, health care providers may recommend pelvic exams and Pap testing every 3 years, or they may recommend the Pap and pelvic exam, combined with testing for human papilloma virus (HPV), every 5 years. Some types of HPV increase your risk of cervical cancer. Testing for HPV may also be done on women of any age with unclear Pap test results.  Other health care providers may not recommend any screening for nonpregnant women who are considered low risk for pelvic cancer and who do not have symptoms. Ask your health care provider if a screening pelvic exam is right for you.  If you have had past treatment for cervical cancer or a condition that could lead to cancer, you need Pap tests and screening for cancer for at least 20 years after your treatment. If Pap tests have been discontinued, your risk factors (such as having a new sexual partner) need to be reassessed to determine if screening should resume. Some women have medical problems that increase the chance of getting cervical cancer. In these cases, your health care provider may recommend more frequent screening and Pap tests. Colorectal Cancer  This type of cancer can be detected and often prevented.  Routine colorectal cancer screening usually begins at 24 years of age and continues through 24 years of age.  Your health care provider may recommend screening at an earlier age if you have risk factors for colon cancer.  Your health care provider may also recommend using home test kits to check for hidden blood  in the stool.  A small camera at the end of a tube can be used to examine your colon directly (sigmoidoscopy or colonoscopy). This is done to check for the earliest forms of colorectal cancer.  Routine screening usually begins at age 50.  Direct examination of the colon should be repeated every 5-10 years through 24 years of age. However, you may need to be screened more often if early forms of precancerous polyps or small growths are found. Skin Cancer  Check your skin from head to toe regularly.  Tell your health care provider about any new moles or changes in moles, especially if there is a change in a mole's shape or color.  Also tell your health care provider if you have a mole that is larger than the size of a pencil eraser.  Always use sunscreen. Apply sunscreen liberally and repeatedly throughout the day.  Protect yourself by wearing long sleeves, pants, a wide-brimmed hat, and sunglasses whenever you   are outside. HEART DISEASE, DIABETES, AND HIGH BLOOD PRESSURE   High blood pressure causes heart disease and increases the risk of stroke. High blood pressure is more likely to develop in:  People who have blood pressure in the high end of the normal range (130-139/85-89 mm Hg).  People who are overweight or obese.  People who are African American.  If you are 18-39 years of age, have your blood pressure checked every 3-5 years. If you are 40 years of age or older, have your blood pressure checked every year. You should have your blood pressure measured twice--once when you are at a hospital or clinic, and once when you are not at a hospital or clinic. Record the average of the two measurements. To check your blood pressure when you are not at a hospital or clinic, you can use:  An automated blood pressure machine at a pharmacy.  A home blood pressure monitor.  If you are between 55 years and 79 years old, ask your health care provider if you should take aspirin to prevent  strokes.  Have regular diabetes screenings. This involves taking a blood sample to check your fasting blood sugar level.  If you are at a normal weight and have a low risk for diabetes, have this test once every three years after 24 years of age.  If you are overweight and have a high risk for diabetes, consider being tested at a younger age or more often. PREVENTING INFECTION  Hepatitis B  If you have a higher risk for hepatitis B, you should be screened for this virus. You are considered at high risk for hepatitis B if:  You were born in a country where hepatitis B is common. Ask your health care provider which countries are considered high risk.  Your parents were born in a high-risk country, and you have not been immunized against hepatitis B (hepatitis B vaccine).  You have HIV or AIDS.  You use needles to inject street drugs.  You live with someone who has hepatitis B.  You have had sex with someone who has hepatitis B.  You get hemodialysis treatment.  You take certain medicines for conditions, including cancer, organ transplantation, and autoimmune conditions. Hepatitis C  Blood testing is recommended for:  Everyone born from 1945 through 1965.  Anyone with known risk factors for hepatitis C. Sexually transmitted infections (STIs)  You should be screened for sexually transmitted infections (STIs) including gonorrhea and chlamydia if:  You are sexually active and are younger than 24 years of age.  You are older than 24 years of age and your health care provider tells you that you are at risk for this type of infection.  Your sexual activity has changed since you were last screened and you are at an increased risk for chlamydia or gonorrhea. Ask your health care provider if you are at risk.  If you do not have HIV, but are at risk, it may be recommended that you take a prescription medicine daily to prevent HIV infection. This is called pre-exposure prophylaxis  (PrEP). You are considered at risk if:  You are sexually active and do not regularly use condoms or know the HIV status of your partner(s).  You take drugs by injection.  You are sexually active with a partner who has HIV. Talk with your health care provider about whether you are at high risk of being infected with HIV. If you choose to begin PrEP, you should first be tested for   HIV. You should then be tested every 3 months for as long as you are taking PrEP.  PREGNANCY   If you are premenopausal and you may become pregnant, ask your health care provider about preconception counseling.  If you may become pregnant, take 400 to 800 micrograms (mcg) of folic acid every day.  If you want to prevent pregnancy, talk to your health care provider about birth control (contraception). OSTEOPOROSIS AND MENOPAUSE   Osteoporosis is a disease in which the bones lose minerals and strength with aging. This can result in serious bone fractures. Your risk for osteoporosis can be identified using a bone density scan.  If you are 74 years of age or older, or if you are at risk for osteoporosis and fractures, ask your health care provider if you should be screened.  Ask your health care provider whether you should take a calcium or vitamin D supplement to lower your risk for osteoporosis.  Menopause may have certain physical symptoms and risks.  Hormone replacement therapy may reduce some of these symptoms and risks. Talk to your health care provider about whether hormone replacement therapy is right for you.  HOME CARE INSTRUCTIONS   Schedule regular health, dental, and eye exams.  Stay current with your immunizations.   Do not use any tobacco products including cigarettes, chewing tobacco, or electronic cigarettes.  If you are pregnant, do not drink alcohol.  If you are breastfeeding, limit how much and how often you drink alcohol.  Limit alcohol intake to no more than 1 drink per day for  nonpregnant women. One drink equals 12 ounces of beer, 5 ounces of wine, or 1 ounces of hard liquor.  Do not use street drugs.  Do not share needles.  Ask your health care provider for help if you need support or information about quitting drugs.  Tell your health care provider if you often feel depressed.  Tell your health care provider if you have ever been abused or do not feel safe at home.   This information is not intended to replace advice given to you by your health care provider. Make sure you discuss any questions you have with your health care provider.   Document Released: 04/20/2011 Document Revised: 10/26/2014 Document Reviewed: 09/06/2013 Elsevier Interactive Patient Education [employer].

## 2016-07-09 LAB — URINE CYTOLOGY ANCILLARY ONLY
CHLAMYDIA, DNA PROBE: NEGATIVE
NEISSERIA GONORRHEA: NEGATIVE
TRICH (WINDOWPATH): NEGATIVE

## 2016-07-09 LAB — HIV ANTIBODY (ROUTINE TESTING W REFLEX): HIV: NONREACTIVE

## 2016-07-09 LAB — RPR

## 2016-07-10 LAB — HSV(HERPES SMPLX)ABS-I+II(IGG+IGM)-BLD: Herpes Simplex Vrs I&II-IgM Ab (EIA): 0.28 INDEX

## 2016-11-13 ENCOUNTER — Ambulatory Visit (INDEPENDENT_AMBULATORY_CARE_PROVIDER_SITE_OTHER): Payer: Self-pay | Admitting: Family Medicine

## 2016-11-13 ENCOUNTER — Encounter: Payer: Self-pay | Admitting: Family Medicine

## 2016-11-13 VITALS — BP 122/88 | HR 94 | Temp 97.0°F | Ht 62.0 in | Wt 121.2 lb

## 2016-11-13 DIAGNOSIS — M549 Dorsalgia, unspecified: Secondary | ICD-10-CM

## 2016-11-13 LAB — POCT URINALYSIS DIPSTICK
Bilirubin, UA: NEGATIVE
Glucose, UA: NEGATIVE
KETONES UA: NEGATIVE
LEUKOCYTES UA: NEGATIVE
Nitrite, UA: NEGATIVE
PH UA: 5
PROTEIN UA: NEGATIVE
RBC UA: NEGATIVE
Urobilinogen, UA: 0.2

## 2016-11-13 NOTE — Patient Instructions (Signed)
BEFORE YOU LEAVE: -udip -low back exercises -follow up: 3-4 weeks  Heat  Topical sports creams with capsaicin or menthol can help  Tylenol if needed - follow dosing instructions

## 2016-11-13 NOTE — Progress Notes (Signed)
  HPI:  Acute visit for:  R low back pain: -"could be muscle strain" -has one year old that is "all over the place" -but wanted to check for UTI as reports had uti 1 month ago and had some back pain with that -this pain started 4 days ago -dull R mid back, mild with certain movements -no fevers, malaise, cough, frequency, urgency, NVD, vaginal symptoms, hematuria, radiation, weakness, numbness  ROS: See pertinent positives and negatives per HPI.  Past Medical History:  Diagnosis Date  . Hypertension   . Migraine   . Postpartum care following vaginal delivery (4/28) 02/14/2015    Past Surgical History:  Procedure Laterality Date  . WISDOM TOOTH EXTRACTION      Family History  Problem Relation Age of Onset  . Diabetes Mother   . Kidney disease Father   . Hypertension Father   . Diabetes Maternal Uncle   . Diabetes Maternal Grandmother   . Cancer Paternal Grandfather     lung  . Autism Paternal Uncle     half-uncle ( husbands brother)   . Hypertension Maternal Grandfather   . Heart attack Maternal Grandfather   . Stroke Maternal Grandfather     Social History   Social History  . Marital status: Married    Spouse name: N/A  . Number of children: N/A  . Years of education: N/A   Social History Main Topics  . Smoking status: Never Smoker  . Smokeless tobacco: Never Used  . Alcohol use No  . Drug use: No  . Sexual activity: Yes   Other Topics Concern  . 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  lisinopril (PRINIVIL,ZESTRIL) 5 MG tablet, Take 1 tablet (5 mg total) by mouth daily., Disp: 30 tablet, Rfl: 3  EXAM:  Vitals:   11/13/16 1555  BP: 122/88  Pulse: 94  Temp: 97 F (36.1 C)    Body mass index is 22.17 kg/m.  GENERAL: vitals reviewed and listed above, alert, oriented, appears well hydrated and in no acute distress  HEENT: atraumatic, conjunttiva clear, no  obvious abnormalities on inspection of external nose and ears  NECK: no obvious masses on inspection  LUNGS: clear to auscultation bilaterally, no wheezes, rales or rhonchi, good air movement  CV: HRRR, no peripheral edema  ABD: no CVA TTP  MS: moves all extremities without noticeable abnormality, normal inspection back, mild TTP in mid back muscles  PSYCH: pleasant and cooperative, no obvious depression or anxiety  ASSESSMENT AND PLAN:  Discussed the following assessment and plan:  Back pain, unspecified back location, unspecified back pain laterality, unspecified chronicity - Plan: POC Urinalysis Dipstick  -we discussed possible serious and likely etiologies, workup and treatment, treatment risks and return precuations -udip good -suspect minor muscle strain - tx per pt instructions -Patient advised to return or notify a doctor immediately if symptoms worsen or persist or new concerns arise.  Patient Instructions  BEFORE YOU LEAVE: -udip -low back exercises -follow up: 3-4 weeks  Heat  Topical sports creams with capsaicin or menthol can help  Tylenol if needed - follow dosing instructions      Kriste BasqueKIM, [place] Kestner R., DO

## 2016-11-13 NOTE — Progress Notes (Signed)
Pre visit review using our clinic review tool, if applicable. No additional management support is needed unless otherwise documented below in the visit note. 

## 2016-11-17 ENCOUNTER — Ambulatory Visit: Payer: Commercial Managed Care - HMO | Admitting: Adult Health

## 2016-12-01 ENCOUNTER — Ambulatory Visit: Payer: Self-pay | Admitting: Adult Health

## 2016-12-16 ENCOUNTER — Ambulatory Visit: Payer: 59 | Admitting: Adult Health

## 2016-12-22 ENCOUNTER — Ambulatory Visit (INDEPENDENT_AMBULATORY_CARE_PROVIDER_SITE_OTHER): Payer: BLUE CROSS/BLUE SHIELD | Admitting: Adult Health

## 2016-12-22 ENCOUNTER — Encounter: Payer: Self-pay | Admitting: Adult Health

## 2016-12-22 VITALS — BP 128/80 | Temp 98.7°F | Ht 62.0 in | Wt 119.0 lb

## 2016-12-22 DIAGNOSIS — M545 Low back pain, unspecified: Secondary | ICD-10-CM

## 2016-12-22 DIAGNOSIS — R030 Elevated blood-pressure reading, without diagnosis of hypertension: Secondary | ICD-10-CM | POA: Diagnosis not present

## 2016-12-22 LAB — POCT URINALYSIS DIPSTICK
BILIRUBIN UA: NEGATIVE
Blood, UA: NEGATIVE
Glucose, UA: NEGATIVE
KETONES UA: NEGATIVE
Nitrite, UA: NEGATIVE
PH UA: 6
Protein, UA: NEGATIVE
Spec Grav, UA: 1.015
Urobilinogen, UA: NEGATIVE

## 2016-12-22 LAB — POCT URINE PREGNANCY: Preg Test, Ur: NEGATIVE

## 2016-12-22 NOTE — Progress Notes (Signed)
Subjective:    Patient ID: Courtney RoyaltyBrittany Morand, female    DOB: 02/13/1991, 26 y.o.   MRN: [id number]  HPI  26 year old female who presents to the office today for low back pain since December. The pain is intermittent and described as "sore". The pain is located on right lower back. No radiating pain. No UTI symptoms.   She has no issues with gait   Denies any trauma   Review of Systems See HPI   Past Medical History:  Diagnosis Date  . Hypertension   . Migraine   . Postpartum care following vaginal delivery (4/28) 02/14/2015    Social History   Social History  . Marital status: Married    Spouse name: N/A  . Number of children: N/A  . Years of education: N/A   Occupational History  . Not on file.   Social History Main Topics  . Smoking status: Never Smoker  . Smokeless tobacco: Never Used  . Alcohol use No  . Drug use: No  . Sexual activity: Yes   Other Topics Concern  . Not on file   Social History Narrative   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    Married        Past Surgical History:  Procedure Laterality Date  . WISDOM TOOTH EXTRACTION      Family History  Problem Relation Age of Onset  . Diabetes Mother   . Kidney disease Father   . Hypertension Father   . Diabetes Maternal Uncle   . Diabetes Maternal Grandmother   . Cancer Paternal Grandfather     lung  . Autism Paternal Uncle     half-uncle ( husbands brother)   . Hypertension Maternal Grandfather   . Heart attack Maternal Grandfather   . Stroke Maternal Grandfather     No Known Allergies  No current outpatient prescriptions on file prior to visit.   No current facility-administered medications on file prior to visit.     BP 128/80   Temp 98.7 F (37.1 C) (Oral)   Ht 5\' 2"  (1.575 m)   Wt 119 lb (54 kg)   BMI 21.77 kg/m       Objective:   Physical Exam  Constitutional: She is oriented to person, place, and time. She appears well-developed and well-nourished. No distress.    Cardiovascular: Normal rate and normal heart sounds.  Exam reveals no gallop and no friction rub.   No murmur heard. Pulmonary/Chest: Effort normal and breath sounds normal. No respiratory distress. She has no wheezes. She has no rales. She exhibits no tenderness.  Abdominal: Soft. Bowel sounds are normal. She exhibits no distension and no mass. There is no tenderness. There is CVA tenderness. There is no rebound and no guarding.  Musculoskeletal: Normal range of motion. She exhibits tenderness (slight tenderness with palpation along right lower back ). She exhibits no edema.  Neurological: She is alert and oriented to person, place, and time.  Skin: Skin is warm and dry. No rash noted. She is not diaphoretic. No erythema. No pallor.  Psychiatric: She has a normal mood and affect. Her behavior is normal. Judgment and thought content normal.  Nursing note and vitals reviewed.     Assessment & Plan:   1. Acute right-sided low back pain without sciatica - Appears to be a muscle strain. Advised heating pad and motrin  - Follow up if no improvement  - POCT urinalysis dipstick + Leukocytes - Urine culture - POCT urine  pregnancy- Negative   2. Elevated blood pressure reading - She is trying to get pregnant.  - Will take her off Lisinopril and monitor BP at home  - Return precautions given   Shirline Frees, NP

## 2016-12-24 LAB — URINE CULTURE: Organism ID, Bacteria: NO GROWTH

## 2017-01-12 ENCOUNTER — Other Ambulatory Visit: Payer: Self-pay | Admitting: Adult Health

## 2017-01-12 DIAGNOSIS — I10 Essential (primary) hypertension: Secondary | ICD-10-CM

## 2017-01-12 NOTE — Telephone Encounter (Signed)
I had d/c this medication during the last visit due to her trying to get pregnant. Is she monitoring her BP at home? How has it been

## 2017-01-12 NOTE — Telephone Encounter (Signed)
I left message for patient to return phone call.   

## 2017-01-13 NOTE — Telephone Encounter (Signed)
I left 2nd message for patient to return phone call.   

## 2017-01-20 NOTE — Telephone Encounter (Signed)
I got in contact with patient. She states that she has been monitoring her BP at home and has been averaging at about 124/88 - patient states she is feeling great and does not need this medication at the time. I will route to [facility] as FYI & refuse for now. Thanks!

## 2017-07-09 ENCOUNTER — Encounter: Payer: Self-pay | Admitting: Adult Health

## 2017-07-29 ENCOUNTER — Encounter (HOSPITAL_COMMUNITY): Payer: Self-pay | Admitting: Emergency Medicine

## 2017-07-29 DIAGNOSIS — O200 Threatened abortion: Secondary | ICD-10-CM | POA: Diagnosis not present

## 2017-07-29 DIAGNOSIS — Z3A01 Less than 8 weeks gestation of pregnancy: Secondary | ICD-10-CM | POA: Diagnosis not present

## 2017-07-29 DIAGNOSIS — I10 Essential (primary) hypertension: Secondary | ICD-10-CM | POA: Diagnosis not present

## 2017-07-29 DIAGNOSIS — N938 Other specified abnormal uterine and vaginal bleeding: Secondary | ICD-10-CM | POA: Diagnosis present

## 2017-07-29 NOTE — ED Triage Notes (Signed)
Patient is [redacted] weeks pregnant and having some vaginal bleeding, no pain at this time.

## 2017-07-30 ENCOUNTER — Emergency Department (HOSPITAL_COMMUNITY)
Admission: EM | Admit: 2017-07-30 | Discharge: 2017-07-30 | Disposition: A | Discharge status: Home / Self Care | Payer: BLUE CROSS/BLUE SHIELD | Attending: Emergency Medicine | Admitting: Emergency Medicine

## 2017-07-30 DIAGNOSIS — O200 Threatened abortion: Secondary | ICD-10-CM

## 2017-07-30 LAB — WET PREP, GENITAL
Sperm: NONE SEEN
Trich, Wet Prep: NONE SEEN
YEAST WET PREP: NONE SEEN

## 2017-07-30 LAB — GC/CHLAMYDIA PROBE AMP (~~LOC~~) NOT AT ARMC
Chlamydia: NEGATIVE
NEISSERIA GONORRHEA: NEGATIVE

## 2017-07-30 LAB — ABO/RH: ABO/RH(D): O POS

## 2017-07-30 LAB — HCG, QUANTITATIVE, PREGNANCY: HCG, BETA CHAIN, QUANT, S: 173 m[IU]/mL — AB (ref <5)

## 2017-07-30 NOTE — ED Provider Notes (Signed)
[facility]-EMERGENCY DEPT Provider Note   CSN: [id number] Arrival date & time: 07/29/17  2301     History   Chief Complaint Chief Complaint  Patient presents with  . Vaginal Bleeding    HPI Courtney [name] is a 26 y.o. female.  HPI  Patient reports she is estimated to be [redacted] weeks pregnant based on home pregnancy test and test at her GYN office 2 days ago. Patient noted just this evening she developed a small amount of vaginal bleeding without associated pain. She identifies just a slight "tightening" feeling. She has otherwise felt well. Past Medical History:  Diagnosis Date  . Hypertension   . Migraine   . Postpartum care following vaginal delivery (4/28) 02/14/2015    Patient Active Problem List   Diagnosis Date Noted  . Postpartum care following vaginal delivery (4/28) 02/14/2015  . SGA (small for gestational age), fetal, affecting care of mother, antepartum 02/13/2015  . [redacted] weeks gestation of pregnancy   . Encounter for fetal anatomic survey   . Poor fetal growth affecting management of mother in third trimester, antepartum     Past Surgical History:  Procedure Laterality Date  . WISDOM TOOTH EXTRACTION      OB History    Gravida Para Term Preterm AB Living   SAB TAB Ectopic Multiple Live Births         0 1       Home Medications    Prior to Admission medications   Not on File    Family History Family History  Problem Relation Age of Onset  . Diabetes Mother   . Kidney disease Father   . Hypertension Father   . Diabetes Maternal Uncle   . Diabetes Maternal Grandmother   . Cancer Paternal Grandfather        lung  . Autism Paternal Uncle        half-uncle ( husbands brother)   . Hypertension Maternal Grandfather   . Heart attack Maternal Grandfather   . Stroke Maternal Grandfather     Social History Social History  Substance Use Topics  . Smoking status: Never Smoker  . Smokeless tobacco: Never Used  . Alcohol use No      Allergies   Patient has no known allergies.   Review of Systems Review of Systems  10 Systems reviewed and are negative for acute change except as noted in the HPI. Physical Exam Updated Vital Signs BP (!) 150/101 (BP Location: Right Arm)   Pulse 95   Temp 98.5 F (36.9 C) (Oral)   Resp 18   LMP 06/28/2017 (Exact Date)   SpO2 100%   Physical Exam  Constitutional: She is oriented to person, place, and time. She appears well-developed and well-nourished. No distress.  HENT:  Head: Normocephalic and atraumatic.  Eyes: Conjunctivae are normal.  Neck: Neck supple.  Cardiovascular: Normal rate and regular rhythm.   No murmur heard. Pulmonary/Chest: Effort normal and breath sounds normal. No respiratory distress.  Abdominal: Soft. There is no tenderness.  Genitourinary: Vagina normal and uterus normal.  Genitourinary Comments: Cervix normal appearance. Slightly friable no active bleeding no clot. Bimanual nontender. No adnexal tenderness or fullness.  Musculoskeletal: She exhibits no edema.  Neurological: She is alert and oriented to person, place, and time. No cranial nerve deficit. She exhibits normal muscle tone. Coordination normal.  Skin: Skin is warm and dry.  Psychiatric: She has a normal mood and affect.  Nursing  note and vitals reviewed.    ED Treatments / Results  Labs (all labs ordered are listed, but only abnormal results are displayed) Labs Reviewed  WET PREP, GENITAL - Abnormal; Notable for the following:       Result Value   Clue Cells Wet Prep HPF POC PRESENT (*)    WBC, Wet Prep HPF POC MANY (*)    All other components within normal limits  HCG, QUANTITATIVE, PREGNANCY - Abnormal; Notable for the following:    hCG, Beta Chain, Quant, S 173 (*)    All other components within normal limits  ABO/RH  GC/CHLAMYDIA PROBE AMP (Martha) NOT AT [facility]    EKG  EKG Interpretation None       Radiology No results found.  Procedures Procedures  (including critical care time)  Medications Ordered in ED Medications - No data to display   Initial Impression / Assessment and Plan / ED Course  I have reviewed the triage vital signs and the nursing notes.  Pertinent labs & imaging results that were available during my care of the patient were reviewed by me and considered in my medical decision making (see chart for details).     Final Clinical Impressions(s) / ED Diagnoses   Final diagnoses:  Threatened miscarriage  Patient is well-known appearance. She does not have associated pain. Quantitative hCG is only 173. At this time, this may represent threatened miscarriage. Patient is aware of signs and symptoms which return. She will contact her GYN specialist in the morning to discuss ongoing hCG monitoring and recheck.  New Prescriptions New Prescriptions   No medications on file     Arby Barrette, MD 07/30/17 [phone]

## 2017-07-30 NOTE — ED Notes (Signed)
Dr. [name] okay with discharging pt before wet prep comes back. States she will call pt with any medication needs

## 2017-07-30 NOTE — ED Notes (Signed)
Patient left at this time with all belongings. 

## 2017-07-30 NOTE — Discharge Instructions (Signed)
1. Your quantitative beta hCG was 173. This lab value needs to be rechecked within 48 hours to determine if it is elevating consistent with pregnancy or decreasing consistent with miscarriage. 2. Return to the emergency department if you have pain, lightheadedness or other concerning symptoms.

## 2017-09-04 ENCOUNTER — Encounter (HOSPITAL_COMMUNITY): Payer: Self-pay | Admitting: *Deleted

## 2017-09-04 ENCOUNTER — Other Ambulatory Visit: Payer: Self-pay

## 2017-09-04 ENCOUNTER — Inpatient Hospital Stay (HOSPITAL_COMMUNITY): Payer: BLUE CROSS/BLUE SHIELD

## 2017-09-04 ENCOUNTER — Inpatient Hospital Stay (HOSPITAL_COMMUNITY)
Admission: AD | Admit: 2017-09-04 | Discharge: 2017-09-04 | Disposition: A | Discharge status: Home / Self Care | Payer: BLUE CROSS/BLUE SHIELD | Source: Ambulatory Visit | Attending: Obstetrics | Admitting: Obstetrics

## 2017-09-04 DIAGNOSIS — O161 Unspecified maternal hypertension, first trimester: Secondary | ICD-10-CM | POA: Diagnosis not present

## 2017-09-04 DIAGNOSIS — O209 Hemorrhage in early pregnancy, unspecified: Secondary | ICD-10-CM

## 2017-09-04 DIAGNOSIS — Z3A10 10 weeks gestation of pregnancy: Secondary | ICD-10-CM | POA: Insufficient documentation

## 2017-09-04 DIAGNOSIS — O26891 Other specified pregnancy related conditions, first trimester: Secondary | ICD-10-CM | POA: Diagnosis present

## 2017-09-04 DIAGNOSIS — O26851 Spotting complicating pregnancy, first trimester: Secondary | ICD-10-CM | POA: Insufficient documentation

## 2017-09-04 DIAGNOSIS — R109 Unspecified abdominal pain: Secondary | ICD-10-CM | POA: Diagnosis present

## 2017-09-04 HISTORY — DX: Anemia, unspecified: D64.9

## 2017-09-04 LAB — URINALYSIS, ROUTINE W REFLEX MICROSCOPIC
BILIRUBIN URINE: NEGATIVE
GLUCOSE, UA: NEGATIVE mg/dL
HGB URINE DIPSTICK: NEGATIVE
Ketones, ur: NEGATIVE mg/dL
Leukocytes, UA: NEGATIVE
Nitrite: NEGATIVE
Protein, ur: NEGATIVE mg/dL
Specific Gravity, Urine: 1.019 (ref 1.005–1.030)
pH: 7 (ref 5.0–8.0)

## 2017-09-04 NOTE — MAU Provider Note (Signed)
History   G3P1011 @ 10.1 wks with mild intermittant cramping and spotting when she wipes. Started this a.m. Upon arising.  CSN: [id number]  Arrival date & time 09/04/17  1012   First Provider Initiated Contact with Patient 09/04/17 1042      Chief Complaint  Patient presents with  . Vaginal Bleeding  . Abdominal Pain    HPI  Past Medical History:  Diagnosis Date  . Anemia   . Hypertension    not currently on meds  . Migraine   . Postpartum care following vaginal delivery (4/28) 02/14/2015    Past Surgical History:  Procedure Laterality Date  . NO PAST SURGERIES    . WISDOM TOOTH EXTRACTION      Family History  Problem Relation Age of Onset  . Diabetes Mother   . Kidney disease Father   . Hypertension Father   . Diabetes Maternal Uncle   . Diabetes Maternal Grandmother   . Cancer Paternal Grandfather        lung  . Autism Paternal Uncle        half-uncle ( husbands brother)   . Hypertension Maternal Grandfather   . Heart attack Maternal Grandfather   . Stroke Maternal Grandfather   . Hypertension Paternal Grandmother   . Stroke Paternal Grandmother   . Heart disease Paternal Grandmother     Social History   Tobacco Use  . Smoking status: Never Smoker  . Smokeless tobacco: Never Used  Substance Use Topics  . Alcohol use: No  . Drug use: No    OB History    Gravida Para Term Preterm AB Living   3 1 1   1 1    SAB TAB Ectopic Multiple Live Births   1     0 1      Review of Systems  Constitutional: Negative.   HENT: Negative.   Eyes: Negative.   Cardiovascular: Negative.   Gastrointestinal: Positive for abdominal pain.  Endocrine: Negative.   Genitourinary: Positive for vaginal bleeding.  Musculoskeletal: Negative.   Skin: Negative.   Allergic/Immunologic: Negative.   Neurological: Negative.   Hematological: Negative.   Psychiatric/Behavioral: Negative.     Allergies  Patient has no known allergies.  Home Medications    BP 127/85 (BP  Location: Left Arm)   Pulse 91   Temp 98.6 F (37 C) (Oral)   Resp 16   Ht 5\' 3"  (1.6 m)   Wt 126 lb 8 oz (57.4 kg)   LMP 06/28/2017 (Exact Date)   SpO2 100%   BMI 22.41 kg/m   Physical Exam  Constitutional: She is oriented to person, place, and time. She appears well-developed and well-nourished.  HENT:  Head: Normocephalic.  Eyes: Pupils are equal, round, and reactive to light.  Neck: Normal range of motion.  Cardiovascular: Normal rate, regular rhythm, normal heart sounds and intact distal pulses.  Pulmonary/Chest: Effort normal and breath sounds normal.  Abdominal: Soft. Bowel sounds are normal.  Genitourinary: Vagina normal and uterus normal.  Musculoskeletal: Normal range of motion.  Neurological: She is alert and oriented to person, place, and time. She has normal reflexes.  Skin: Skin is warm and dry.  Psychiatric: She has a normal mood and affect. Her behavior is normal. Judgment and thought content normal.    MAU Course  Procedures (including critical care time)  Labs Reviewed  URINALYSIS, ROUTINE W REFLEX MICROSCOPIC   No results found.   No diagnosis found.    MDM  VSS, no active bleeding.  [place] show viable twin preg at 9.6 days. POC discussed with Dr. Ernestina PennaFogleman pt to be d/c home

## 2017-09-04 NOTE — MAU Note (Addendum)
Having a little bit of cramping and some spotting this morning.  Has been seen with the preg, US confrmed IUP

## 2017-09-04 NOTE — Discharge Instructions (Signed)

## 2017-09-13 ENCOUNTER — Encounter: Payer: Self-pay | Admitting: *Deleted

## 2017-09-22 ENCOUNTER — Encounter: Payer: Self-pay | Admitting: Family Medicine

## 2017-09-22 ENCOUNTER — Other Ambulatory Visit (HOSPITAL_COMMUNITY)
Admission: RE | Admit: 2017-09-22 | Discharge: 2017-09-22 | Disposition: A | Discharge status: Home / Self Care | Payer: BLUE CROSS/BLUE SHIELD | Source: Ambulatory Visit | Attending: Family Medicine | Admitting: Family Medicine

## 2017-09-22 ENCOUNTER — Ambulatory Visit (INDEPENDENT_AMBULATORY_CARE_PROVIDER_SITE_OTHER): Payer: BLUE CROSS/BLUE SHIELD | Admitting: Family Medicine

## 2017-09-22 VITALS — BP 138/85 | HR 98 | Wt 128.9 lb

## 2017-09-22 DIAGNOSIS — Z8679 Personal history of other diseases of the circulatory system: Secondary | ICD-10-CM

## 2017-09-22 DIAGNOSIS — O0991 Supervision of high risk pregnancy, unspecified, first trimester: Secondary | ICD-10-CM

## 2017-09-22 DIAGNOSIS — A5909 Other urogenital trichomoniasis: Secondary | ICD-10-CM | POA: Diagnosis not present

## 2017-09-22 DIAGNOSIS — O2390 Unspecified genitourinary tract infection in pregnancy, unspecified trimester: Secondary | ICD-10-CM | POA: Insufficient documentation

## 2017-09-22 DIAGNOSIS — O30031 Twin pregnancy, monochorionic/diamniotic, first trimester: Secondary | ICD-10-CM

## 2017-09-22 DIAGNOSIS — O0990 Supervision of high risk pregnancy, unspecified, unspecified trimester: Secondary | ICD-10-CM | POA: Insufficient documentation

## 2017-09-22 DIAGNOSIS — Z8759 Personal history of other complications of pregnancy, childbirth and the puerperium: Secondary | ICD-10-CM | POA: Insufficient documentation

## 2017-09-22 DIAGNOSIS — O30039 Twin pregnancy, monochorionic/diamniotic, unspecified trimester: Secondary | ICD-10-CM

## 2017-09-22 DIAGNOSIS — O10919 Unspecified pre-existing hypertension complicating pregnancy, unspecified trimester: Secondary | ICD-10-CM | POA: Insufficient documentation

## 2017-09-22 MED ORDER — DICLEGIS 10-10 MG PO TBEC: 1.0000 | DELAYED_RELEASE_TABLET | Freq: Two times a day (BID) | ORAL | 6 refills | Status: DC

## 2017-09-22 NOTE — Progress Notes (Signed)
Subjective:  Courtney [name] is a G3P1011 6065w3d being seen today for her first obstetrical visit.  Her obstetrical history is significant for CHTN, h/o SGA in prior pregnancy, Mono/di twins. Patient does intend to breast feed. Pregnancy history fully reviewed.  Patient reports nausea.  BP 138/85   Pulse 98   Wt 128 lb 14.4 oz (58.5 kg)   LMP 06/28/2017 (Exact Date)   BMI 22.83 kg/m   HISTORY: OB History  Gravida Para Term Preterm AB Living  3 1 1   1 1   SAB TAB Ectopic Multiple Live Births  1     0 1    # Outcome Date GA Lbr Len/2nd Weight Sex Delivery Anes PTL Lv  3 Current           2 Term 02/14/15 1970w1d 02:47 / 00:09 5 lb 10.8 oz (2.575 kg) F Vag-Spont EPI  LIV  1 SAB               Past Medical History:  Diagnosis Date  . Anemia   . Hypertension    not currently on meds  . Migraine   . Postpartum care following vaginal delivery (4/28) 02/14/2015    Past Surgical History:  Procedure Laterality Date  . NO PAST SURGERIES    . WISDOM TOOTH EXTRACTION      Family History  Problem Relation Age of Onset  . Diabetes Mother   . Kidney disease Father   . Hypertension Father   . Diabetes Maternal Uncle   . Diabetes Maternal Grandmother   . Cancer Paternal Grandfather        lung  . Autism Paternal Uncle        half-uncle ( husbands brother)   . Hypertension Maternal Grandfather   . Heart attack Maternal Grandfather   . Stroke Maternal Grandfather   . Hypertension Paternal Grandmother   . Stroke Paternal Grandmother   . Heart disease Paternal Grandmother      Exam    Uterus:     Pelvic Exam:    Perineum: No Hemorrhoids, Normal Perineum   Vulva: normal, Bartholin's, Urethra, Skene's normal   Vagina:  normal mucosa   Cervix: multiparous appearance, no cervical motion tenderness and no lesions   Adnexa: normal adnexa and no mass, fullness, tenderness   Bony Pelvis: gynecoid  System: Breast:  Done at [facility], declined exam here   Skin: normal coloration  and turgor, no rashes    Neurologic: gait normal; reflexes normal and symmetric   Extremities: normal strength, tone, and muscle mass, no deformities, no erythema, induration, or nodules, no evidence of joint effusion, ROM of all joints is normal   HEENT PERRLA, extra ocular movement intact and sclera clear, anicteric   Mouth/Teeth mucous membranes moist, pharynx normal without lesions   Neck supple and no masses   Cardiovascular: regular rate and rhythm, no murmurs or gallops   Respiratory:  appears well, vitals normal, no respiratory distress, acyanotic, normal RR, ear and throat exam is normal, neck free of mass or lymphadenopathy, chest clear, no wheezing, crepitations, rhonchi, normal symmetric air entry   Abdomen: soft, non-tender; bowel sounds normal; no masses,  no organomegaly   Urinary: urethral meatus normal      Assessment:    Pregnancy: [id number] Patient Active Problem List   Diagnosis Date Noted  . History of hypertension 09/22/2017  . Supervision of high risk pregnancy, antepartum 09/22/2017  . History of prior pregnancy with SGA newborn 09/22/2017  Plan:   1. Supervision of high risk pregnancy, antepartum Requested genetic testing - requested referral to genetic counseling. US - Cytology - PAP - Hemoglobin A1c - Culture, OB Urine - Obstetric Panel, Including HIV - AMB referral to maternal fetal medicine - US MFM Fetal Nuchal Translucency; Future  2. History of hypertension - US MFM Fetal Nuchal Translucency; Future  3. History of prior pregnancy with SGA newborn - AMB referral to maternal fetal medicine - US MFM Fetal Nuchal Translucency; Future  4. Monochorionic diamniotic twin pregnancy, antepartum Will need growth US q3 weeks, then antenatal testing at 32 weeks if concordant   Problem list reviewed and updated. 75% of 30 min visit spent on counseling and coordination of care.     Levie HeritageJacob J Aida Lemaire 09/22/2017

## 2017-09-22 NOTE — Progress Notes (Signed)
LAST PAP 2015 Flu shot  needed

## 2017-09-23 ENCOUNTER — Telehealth: Payer: Self-pay

## 2017-09-23 LAB — OBSTETRIC PANEL, INCLUDING HIV
ANTIBODY SCREEN: NEGATIVE
Basophils Absolute: 0 10*3/uL (ref 0.0–0.2)
Basos: 0 %
EOS (ABSOLUTE): 0.1 10*3/uL (ref 0.0–0.4)
EOS: 1 %
HEP B S AG: NEGATIVE
HIV Screen 4th Generation wRfx: NONREACTIVE
Hematocrit: 36.1 % (ref 34.0–46.6)
Hemoglobin: 12.2 g/dL (ref 11.1–15.9)
IMMATURE GRANS (ABS): 0 10*3/uL (ref 0.0–0.1)
Immature Granulocytes: 0 %
LYMPHS ABS: 2.1 10*3/uL (ref 0.7–3.1)
Lymphs: 21 %
MCH: 29.1 pg (ref 26.6–33.0)
MCHC: 33.8 g/dL (ref 31.5–35.7)
MCV: 86 fL (ref 79–97)
MONOS ABS: 0.9 10*3/uL (ref 0.1–0.9)
Monocytes: 9 %
NEUTROS ABS: 6.8 10*3/uL (ref 1.4–7.0)
Neutrophils: 69 %
PLATELETS: 292 10*3/uL (ref 150–379)
RBC: 4.19 x10E6/uL (ref 3.77–5.28)
RDW: 13.9 % (ref 12.3–15.4)
RH TYPE: POSITIVE
RPR: NONREACTIVE
RUBELLA: 13.4 {index} (ref >0.99)
WBC: 9.9 10*3/uL (ref 3.4–10.8)

## 2017-09-23 NOTE — Telephone Encounter (Signed)
Patient was prescribed diclegis for nausea and vomiting. [facility] it will be at least a week before we here anything.

## 2017-09-24 LAB — CYTOLOGY - PAP
Candida vaginitis: NEGATIVE
Diagnosis: NEGATIVE
HPV: NOT DETECTED
Trichomonas: POSITIVE — AB

## 2017-09-24 LAB — CULTURE, OB URINE

## 2017-09-24 LAB — URINE CULTURE, OB REFLEX

## 2017-09-28 ENCOUNTER — Other Ambulatory Visit: Payer: Self-pay | Admitting: Family Medicine

## 2017-09-28 MED ORDER — METRONIDAZOLE 500 MG PO TABS: 500.0000 mg | ORAL_TABLET | Freq: Two times a day (BID) | ORAL | 0 refills | Status: DC

## 2017-09-29 ENCOUNTER — Telehealth: Payer: Self-pay

## 2017-09-29 ENCOUNTER — Encounter (HOSPITAL_COMMUNITY): Payer: Self-pay | Admitting: Family Medicine

## 2017-09-29 NOTE — Telephone Encounter (Signed)
Per Dr. Adrian BlackwaterStinson, patient has trichomonas. Flagyl called in. Needs partner treatment. Please notify patient. Called patient to inform her that she tested positive for trichomonas.Explained to patient that trichomonas is caused by a parasite and is sexually transmitted.Advised patient to have partner treated. Informed patient that medication was sent to pharmacy.Patient verbalized understanding.

## 2017-10-06 ENCOUNTER — Encounter (HOSPITAL_COMMUNITY): Payer: Self-pay

## 2017-10-06 ENCOUNTER — Other Ambulatory Visit: Payer: Self-pay | Admitting: Family Medicine

## 2017-10-06 ENCOUNTER — Ambulatory Visit (HOSPITAL_COMMUNITY)
Admission: RE | Admit: 2017-10-06 | Discharge: 2017-10-06 | Disposition: A | Discharge status: Home / Self Care | Payer: BLUE CROSS/BLUE SHIELD | Source: Ambulatory Visit | Attending: Family Medicine | Admitting: Family Medicine

## 2017-10-06 ENCOUNTER — Other Ambulatory Visit (HOSPITAL_COMMUNITY): Payer: Self-pay | Admitting: *Deleted

## 2017-10-06 DIAGNOSIS — O0990 Supervision of high risk pregnancy, unspecified, unspecified trimester: Secondary | ICD-10-CM

## 2017-10-06 DIAGNOSIS — Z8679 Personal history of other diseases of the circulatory system: Secondary | ICD-10-CM

## 2017-10-06 DIAGNOSIS — Z8759 Personal history of other complications of pregnancy, childbirth and the puerperium: Secondary | ICD-10-CM | POA: Insufficient documentation

## 2017-10-06 DIAGNOSIS — Z3682 Encounter for antenatal screening for nuchal translucency: Secondary | ICD-10-CM

## 2017-10-06 DIAGNOSIS — Z369 Encounter for antenatal screening, unspecified: Secondary | ICD-10-CM | POA: Diagnosis present

## 2017-10-06 DIAGNOSIS — Z3A13 13 weeks gestation of pregnancy: Secondary | ICD-10-CM | POA: Diagnosis not present

## 2017-10-06 DIAGNOSIS — O0991 Supervision of high risk pregnancy, unspecified, first trimester: Secondary | ICD-10-CM | POA: Insufficient documentation

## 2017-10-06 DIAGNOSIS — O30031 Twin pregnancy, monochorionic/diamniotic, first trimester: Secondary | ICD-10-CM | POA: Diagnosis not present

## 2017-10-06 DIAGNOSIS — O30039 Twin pregnancy, monochorionic/diamniotic, unspecified trimester: Secondary | ICD-10-CM

## 2017-10-08 ENCOUNTER — Other Ambulatory Visit: Payer: Self-pay

## 2017-10-10 ENCOUNTER — Encounter: Payer: Self-pay | Admitting: Family Medicine

## 2017-10-15 ENCOUNTER — Telehealth: Payer: Self-pay | Admitting: General Practice

## 2017-10-15 NOTE — Telephone Encounter (Signed)
Patient called and left message on nurse line requesting genetic test results. Called MFM & results are not available yet, should be another week. Called and informed patient and discussed MFM will contact her when results are back. Patient verbalized understanding & asked if that would be in [facility]. Told patient it will not since it is ran from an external lab but she can come by the office and get a copy once her results are back. Patient verbalized understanding & had no questions

## 2017-10-18 ENCOUNTER — Other Ambulatory Visit (HOSPITAL_COMMUNITY): Payer: Self-pay

## 2017-10-19 NOTE — L&D Delivery Note (Addendum)
Patient is 27 y.o. [id number] 2324w0d admitted for IOL for mono/di twins. S/p IOL with Pitocin. AROM at 1930 Twin A.  Prenatal course also complicated by cHTN.  GBS neg  Delivery Note   Courtney [name], BoyA Courtney [name] [[id number]][[id number]]  At 8:21 PM a viable female was delivered via  (Presentation: LOA).  APGAR: 2, 8; weight pending 1hr skin to skin.  Placenta status: Intact, Loose nuchal x1.  Cord: 3V  with the following complications: None.     Courtney [name], Courtney [name] [[id number]][[id number]]  At 8:25 PM a viable female was delivered via  (Presentation: breech).  APGAR: 6, 9; weight pending 1hr skin to skin.   Placenta status: Intact.  Cord: 3V with the following complications: None.  Anesthesia:  Epidural Episiotomy:  None Lacerations: None  Est. Blood Loss (mL):  100  Mom to postpartum.   Baby A to Couplet care / Skin to Skin.   Baby B to Couplet care / Skin to Skin.  Caryl AdaJazma Jaielle Dlouhy, DO 03/20/2018, 8:54 PM  OB Fellow [facility]

## 2017-10-20 ENCOUNTER — Ambulatory Visit (INDEPENDENT_AMBULATORY_CARE_PROVIDER_SITE_OTHER): Payer: BLUE CROSS/BLUE SHIELD | Admitting: Obstetrics and Gynecology

## 2017-10-20 VITALS — BP 140/88 | HR 98 | Wt 136.1 lb

## 2017-10-20 DIAGNOSIS — Z8679 Personal history of other diseases of the circulatory system: Secondary | ICD-10-CM

## 2017-10-20 DIAGNOSIS — Z8759 Personal history of other complications of pregnancy, childbirth and the puerperium: Secondary | ICD-10-CM

## 2017-10-20 DIAGNOSIS — O0990 Supervision of high risk pregnancy, unspecified, unspecified trimester: Secondary | ICD-10-CM

## 2017-10-20 DIAGNOSIS — O30039 Twin pregnancy, monochorionic/diamniotic, unspecified trimester: Secondary | ICD-10-CM

## 2017-10-20 MED ORDER — ASPIRIN EC 81 MG PO TBEC: 81.0000 mg | DELAYED_RELEASE_TABLET | Freq: Every day | ORAL | 2 refills | Status: DC

## 2017-10-20 NOTE — Progress Notes (Signed)
   PRENATAL VISIT NOTE  Subjective:  Courtney [name] is a 27 y.o. G3P1011 at 118w3d being seen today for ongoing prenatal care.  She is currently monitored for the following issues for this high-risk pregnancy and has History of hypertension; Supervision of high risk pregnancy, antepartum; History of prior pregnancy with SGA newborn; and Monochorionic diamniotic twin pregnancy, antepartum on their problem list.  Patient reports minor tightening occasionally.  Contractions: Irritability. Vag. Bleeding: None.  Movement: Present. Denies leaking of fluid.   The following portions of the patient's history were reviewed and updated as appropriate: allergies, current medications, past family history, past medical history, past social history, past surgical history and problem list. Problem list updated.  Objective:   Vitals:   10/20/17 1633  BP: 140/88  Pulse: 98  Weight: 136 lb 1.6 oz (61.7 kg)    Fetal Status: Fetal Heart Rate (bpm): 145/150   Movement: Present     General:  Alert, oriented and cooperative. Patient is in no acute distress.  Skin: Skin is warm and dry. No rash noted.   Cardiovascular: Normal heart rate noted  Respiratory: Normal respiratory effort, no problems with respiration noted  Abdomen: Soft, gravid, appropriate for gestational age.  Pain/Pressure: Present     Pelvic: Cervical exam deferred        Extremities: Normal range of motion.  Edema: None  Mental Status:  Normal mood and affect. Normal behavior. Normal judgment and thought content.   Assessment and Plan:  Pregnancy: G3P1011 at 478w3d  1. Supervision of high risk pregnancy, antepartum Reviewed risks with mono/di twins and importance of attending US Will get flu shot next week Would like Quad screen when appropriate  2. History of hypertension STart baby ASA  3. Monochorionic diamniotic twin pregnancy, antepartum Next appt 10/27/17  4. History of prior pregnancy with SGA newborn   Preterm labor  symptoms and general obstetric precautions including but not limited to vaginal bleeding, contractions, leaking of fluid and fetal movement were reviewed in detail with the patient. Please refer to After Visit Summary for other counseling recommendations.  F/u with US appt and next OB visit   Conan BowensKelly M Davis, MD

## 2017-10-26 ENCOUNTER — Other Ambulatory Visit (HOSPITAL_COMMUNITY): Payer: Self-pay

## 2017-10-27 ENCOUNTER — Ambulatory Visit (HOSPITAL_COMMUNITY)
Admission: RE | Admit: 2017-10-27 | Discharge: 2017-10-27 | Disposition: A | Discharge status: Home / Self Care | Payer: Medicaid Other | Source: Ambulatory Visit | Attending: Family Medicine | Admitting: Family Medicine

## 2017-10-27 ENCOUNTER — Encounter (HOSPITAL_COMMUNITY): Payer: Self-pay

## 2017-10-27 DIAGNOSIS — O10019 Pre-existing essential hypertension complicating pregnancy, unspecified trimester: Secondary | ICD-10-CM | POA: Diagnosis not present

## 2017-10-27 DIAGNOSIS — O09299 Supervision of pregnancy with other poor reproductive or obstetric history, unspecified trimester: Secondary | ICD-10-CM | POA: Diagnosis not present

## 2017-10-27 DIAGNOSIS — O30031 Twin pregnancy, monochorionic/diamniotic, first trimester: Secondary | ICD-10-CM | POA: Insufficient documentation

## 2017-10-27 DIAGNOSIS — Z3A16 16 weeks gestation of pregnancy: Secondary | ICD-10-CM | POA: Diagnosis not present

## 2017-10-27 DIAGNOSIS — O30039 Twin pregnancy, monochorionic/diamniotic, unspecified trimester: Secondary | ICD-10-CM

## 2017-11-10 ENCOUNTER — Encounter (HOSPITAL_COMMUNITY): Payer: Self-pay

## 2017-11-10 ENCOUNTER — Ambulatory Visit (HOSPITAL_COMMUNITY)
Admission: RE | Admit: 2017-11-10 | Discharge: 2017-11-10 | Disposition: A | Discharge status: Home / Self Care | Payer: Medicaid Other | Source: Ambulatory Visit | Attending: Family Medicine | Admitting: Family Medicine

## 2017-11-10 DIAGNOSIS — Z3A18 18 weeks gestation of pregnancy: Secondary | ICD-10-CM | POA: Diagnosis not present

## 2017-11-10 DIAGNOSIS — O30039 Twin pregnancy, monochorionic/diamniotic, unspecified trimester: Secondary | ICD-10-CM | POA: Insufficient documentation

## 2017-11-10 NOTE — Addendum Note (Signed)
Encounter addended by: Ellin SabaKennedy, Jonatan Wilsey M on: 11/10/2017 11:28 AM  Actions taken: Imaging Exam ended

## 2017-11-15 ENCOUNTER — Ambulatory Visit: Payer: BLUE CROSS/BLUE SHIELD | Admitting: Obstetrics and Gynecology

## 2017-11-17 ENCOUNTER — Encounter: Payer: Self-pay | Admitting: General Practice

## 2017-11-19 ENCOUNTER — Encounter: Payer: Self-pay | Admitting: Obstetrics and Gynecology

## 2017-11-19 ENCOUNTER — Ambulatory Visit (INDEPENDENT_AMBULATORY_CARE_PROVIDER_SITE_OTHER): Payer: BLUE CROSS/BLUE SHIELD | Admitting: Obstetrics and Gynecology

## 2017-11-19 VITALS — BP 130/81 | HR 96 | Wt 141.5 lb

## 2017-11-19 DIAGNOSIS — O0990 Supervision of high risk pregnancy, unspecified, unspecified trimester: Secondary | ICD-10-CM

## 2017-11-19 DIAGNOSIS — O10912 Unspecified pre-existing hypertension complicating pregnancy, second trimester: Secondary | ICD-10-CM

## 2017-11-19 DIAGNOSIS — O0992 Supervision of high risk pregnancy, unspecified, second trimester: Secondary | ICD-10-CM

## 2017-11-19 DIAGNOSIS — O10919 Unspecified pre-existing hypertension complicating pregnancy, unspecified trimester: Secondary | ICD-10-CM

## 2017-11-19 DIAGNOSIS — O30039 Twin pregnancy, monochorionic/diamniotic, unspecified trimester: Secondary | ICD-10-CM

## 2017-11-19 LAB — POCT URINALYSIS DIP (DEVICE)
BILIRUBIN URINE: NEGATIVE
GLUCOSE, UA: NEGATIVE mg/dL
Hgb urine dipstick: NEGATIVE
Ketones, ur: NEGATIVE mg/dL
NITRITE: NEGATIVE
Protein, ur: NEGATIVE mg/dL
SPECIFIC GRAVITY, URINE: 1.02 (ref 1.005–1.030)
Urobilinogen, UA: 0.2 mg/dL (ref 0.0–1.0)
pH: 7 (ref 5.0–8.0)

## 2017-11-19 MED ORDER — PRENATAL 27-1 MG PO TABS: 1.0000 | ORAL_TABLET | Freq: Every day | ORAL | 12 refills | Status: DC

## 2017-11-19 MED ORDER — FOLIC ACID 1 MG PO TABS: 1.0000 mg | ORAL_TABLET | Freq: Every day | ORAL | 3 refills | Status: DC

## 2017-11-19 NOTE — Progress Notes (Signed)
Prenatal Visit Note Date: 11/19/2017 Clinic: [facility]  Subjective:  Courtney [name] is a 27 y.o. G3P1011 at 4459w5d being seen today for ongoing prenatal care.  She is currently monitored for the following issues for this high-risk pregnancy and has Chronic hypertension during pregnancy, antepartum; Supervision of high risk pregnancy, antepartum; History of prior pregnancy with SGA newborn; and Monochorionic diamniotic twin pregnancy, antepartum on their problem list.  Patient reports no complaints.   Contractions: Not present. Vag. Bleeding: None.  Movement: Present. Denies leaking of fluid.   The following portions of the patient's history were reviewed and updated as appropriate: allergies, current medications, past family history, past medical history, past social history, past surgical history and problem list. Problem list updated.  Objective:   Vitals:   11/19/17 1012  BP: 130/81  Pulse: 96  Weight: 141 lb 8 oz (64.2 kg)    Fetal Status: Fetal Heart Rate (bpm): 145/145   Movement: Present     General:  Alert, oriented and cooperative. Patient is in no acute distress.  Skin: Skin is warm and dry. No rash noted.   Cardiovascular: Normal heart rate noted  Respiratory: Normal respiratory effort, no problems with respiration noted  Abdomen: Soft, gravid, appropriate for gestational age. Pain/Pressure: Present     Pelvic:  Cervical exam deferred        Extremities: Normal range of motion.  Edema: None  Mental Status: Normal mood and affect. Normal behavior. Normal judgment and thought content.   Urinalysis:      Assessment and Plan:  Pregnancy: G3P1011 at 8359w5d  1. Supervision of high risk pregnancy, antepartum Routine care. Declines afp - Protein / Creatinine Ratio, Urine - CMP and Liver  2. Chronic hypertension during pregnancy, antepartum F/u serial scans. Continue low dose asa. Doing well on no meds  3. Monochorionic diamniotic twin pregnancy,  antepartum No current issues. Extra 1mg  FA prescribed. Already set up for serial scans  Preterm labor symptoms and general obstetric precautions including but not limited to vaginal bleeding, contractions, leaking of fluid and fetal movement were reviewed in detail with the patient. Please refer to After Visit Summary for other counseling recommendations.  Return in about 3 weeks (around 12/10/2017) for 3-4wk hrob.   Damon BingPickens, Stevana Dufner, MD

## 2017-11-20 LAB — CMP AND LIVER
ALT: 9 IU/L (ref 0–32)
AST: 14 IU/L (ref 0–40)
Albumin: 3.5 g/dL (ref 3.5–5.5)
Alkaline Phosphatase: 55 IU/L (ref 39–117)
BUN: 7 mg/dL (ref 6–20)
Bilirubin Total: <0.2 mg/dL (ref 0.0–1.2)
Bilirubin, Direct: 0.07 mg/dL (ref 0.00–0.40)
CALCIUM: 8.6 mg/dL — AB (ref 8.7–10.2)
CO2: 18 mmol/L — ABNORMAL LOW (ref 20–29)
CREATININE: 0.52 mg/dL — AB (ref 0.57–1.00)
Chloride: 102 mmol/L (ref 96–106)
GFR calc Af Amer: 154 mL/min/{1.73_m2} (ref >59)
GFR, EST NON AFRICAN AMERICAN: 133 mL/min/{1.73_m2} (ref >59)
GLUCOSE: 82 mg/dL (ref 65–99)
POTASSIUM: 4 mmol/L (ref 3.5–5.2)
SODIUM: 137 mmol/L (ref 134–144)
TOTAL PROTEIN: 6.6 g/dL (ref 6.0–8.5)

## 2017-11-20 LAB — PROTEIN / CREATININE RATIO, URINE
Creatinine, Urine: 160.1 mg/dL
PROTEIN/CREAT RATIO: 92 mg/g{creat} (ref 0–200)
Protein, Ur: 14.8 mg/dL

## 2017-11-24 ENCOUNTER — Other Ambulatory Visit (HOSPITAL_COMMUNITY): Payer: Self-pay | Admitting: Obstetrics and Gynecology

## 2017-11-24 ENCOUNTER — Ambulatory Visit (HOSPITAL_COMMUNITY)
Admission: RE | Admit: 2017-11-24 | Discharge: 2017-11-24 | Disposition: A | Discharge status: Home / Self Care | Payer: Medicaid Other | Source: Ambulatory Visit | Attending: Family Medicine | Admitting: Family Medicine

## 2017-11-24 ENCOUNTER — Encounter (HOSPITAL_COMMUNITY): Payer: Self-pay

## 2017-11-24 DIAGNOSIS — Z3A20 20 weeks gestation of pregnancy: Secondary | ICD-10-CM

## 2017-11-24 DIAGNOSIS — O09292 Supervision of pregnancy with other poor reproductive or obstetric history, second trimester: Secondary | ICD-10-CM | POA: Insufficient documentation

## 2017-11-24 DIAGNOSIS — O30039 Twin pregnancy, monochorionic/diamniotic, unspecified trimester: Secondary | ICD-10-CM

## 2017-11-24 DIAGNOSIS — O09299 Supervision of pregnancy with other poor reproductive or obstetric history, unspecified trimester: Secondary | ICD-10-CM

## 2017-11-24 DIAGNOSIS — O09892 Supervision of other high risk pregnancies, second trimester: Secondary | ICD-10-CM | POA: Insufficient documentation

## 2017-11-24 DIAGNOSIS — O10019 Pre-existing essential hypertension complicating pregnancy, unspecified trimester: Secondary | ICD-10-CM

## 2017-11-24 DIAGNOSIS — O30032 Twin pregnancy, monochorionic/diamniotic, second trimester: Secondary | ICD-10-CM | POA: Diagnosis present

## 2017-11-24 DIAGNOSIS — O10012 Pre-existing essential hypertension complicating pregnancy, second trimester: Secondary | ICD-10-CM | POA: Insufficient documentation

## 2017-11-28 ENCOUNTER — Encounter: Payer: Self-pay | Admitting: Obstetrics and Gynecology

## 2017-12-06 ENCOUNTER — Encounter (HOSPITAL_COMMUNITY): Payer: Self-pay | Admitting: *Deleted

## 2017-12-06 ENCOUNTER — Other Ambulatory Visit: Payer: Self-pay

## 2017-12-06 ENCOUNTER — Inpatient Hospital Stay (HOSPITAL_COMMUNITY)
Admission: AD | Admit: 2017-12-06 | Discharge: 2017-12-06 | Disposition: A | Discharge status: Home / Self Care | Payer: BLUE CROSS/BLUE SHIELD | Source: Ambulatory Visit | Attending: Obstetrics & Gynecology | Admitting: Obstetrics & Gynecology

## 2017-12-06 ENCOUNTER — Telehealth: Payer: Self-pay | Admitting: General Practice

## 2017-12-06 DIAGNOSIS — O30002 Twin pregnancy, unspecified number of placenta and unspecified number of amniotic sacs, second trimester: Secondary | ICD-10-CM | POA: Insufficient documentation

## 2017-12-06 DIAGNOSIS — O26892 Other specified pregnancy related conditions, second trimester: Secondary | ICD-10-CM | POA: Diagnosis not present

## 2017-12-06 DIAGNOSIS — Z3A22 22 weeks gestation of pregnancy: Secondary | ICD-10-CM | POA: Insufficient documentation

## 2017-12-06 DIAGNOSIS — Z7982 Long term (current) use of aspirin: Secondary | ICD-10-CM | POA: Diagnosis not present

## 2017-12-06 DIAGNOSIS — N898 Other specified noninflammatory disorders of vagina: Secondary | ICD-10-CM | POA: Diagnosis not present

## 2017-12-06 LAB — WET PREP, GENITAL
CLUE CELLS WET PREP: NONE SEEN
SPERM: NONE SEEN
TRICH WET PREP: NONE SEEN
YEAST WET PREP: NONE SEEN

## 2017-12-06 LAB — AMNISURE RUPTURE OF MEMBRANE (ROM) NOT AT ARMC: Amnisure ROM: NEGATIVE

## 2017-12-06 MED ORDER — TERCONAZOLE 0.4 % VA CREA: 1.0000 | TOPICAL_CREAM | Freq: Every day | VAGINAL | 0 refills | Status: AC

## 2017-12-06 NOTE — Telephone Encounter (Signed)
Patient called and left message on nurse line stating she is having a watery discharge that she noticed running down her leg. Patient wants to know if she should be concerned. Called patient and she states the discharge is more of a normal consistency now and isn't watery anymore. Told patient she should still go to MAU as a precaution to make sure her water didn't break. Patient verbalized understanding & had no questions

## 2017-12-06 NOTE — MAU Provider Note (Signed)
History     CSN: [id number]  Arrival date and time: 12/06/17 1551   Seen by provider at 1632  Chief Complaint  Patient presents with  . Vaginal Discharge   HPI [place] 27 y.o. [redacted]w[redacted]d with twin gestation.  Comes to MAU as she had a watery discharge this morning and none since then.  Is not having abdominal pain or vaginal bleeding.  Last intercourse was 2 days ago.  Has finished treatment for a yeast infection  - treated with 7 days of monistat vaginally.  OB History    Gravida Para Term Preterm AB Living   3 1 1   1 1    SAB TAB Ectopic Multiple Live Births   1     0 1      Past Medical History:  Diagnosis Date  . Anemia   . Hypertension    not currently on meds  . Migraine     Past Surgical History:  Procedure Laterality Date  . WISDOM TOOTH EXTRACTION      Family History  Problem Relation Age of Onset  . Diabetes Mother   . Kidney disease Father   . Hypertension Father   . Diabetes Maternal Uncle   . Diabetes Maternal Grandmother   . Cancer Paternal Grandfather        lung  . Autism Paternal Uncle        half-uncle ( husbands brother)   . Hypertension Maternal Grandfather   . Heart attack Maternal Grandfather   . Stroke Maternal Grandfather   . Hypertension Paternal Grandmother   . Stroke Paternal Grandmother   . Heart disease Paternal Grandmother     Social History   Tobacco Use  . Smoking status: Never Smoker  . Smokeless tobacco: Never Used  Substance Use Topics  . Alcohol use: No  . Drug use: No    Allergies: No Known Allergies  Medications Prior to Admission  Medication Sig Dispense Refill Last Dose  . aspirin EC 81 MG tablet Take 1 tablet (81 mg total) by mouth daily. Take after 12 weeks for prevention of preeclampsia later in pregnancy 300 tablet 2 Taking  . DICLEGIS 10-10 MG TBEC Take 1-2 tablets by mouth 2 (two) times daily. Take 1 tablet in the afternoon, 2 tablets at night (Patient not taking: Reported on 11/10/2017) 90 tablet 6  Not Taking  . folic acid (FOLVITE) 1 MG tablet Take 1 tablet (1 mg total) by mouth daily. (Patient not taking: Reported on 11/24/2017) 60 tablet 3 Not Taking  . PRENATAL 27-1 MG TABS Take 1 tablet by mouth daily. 30 each 12 Taking  . Prenatal Vit-Fe Fumarate-FA (PRENATAL MULTIVITAMIN) TABS tablet Take 1 tablet daily at 12 noon by mouth.   Taking    Review of Systems  Constitutional: Negative for fever.  Gastrointestinal: Negative for abdominal pain, diarrhea, nausea and vomiting.  Genitourinary: Negative for dysuria and vaginal discharge.       Vaginal leaking noted today x 1   Physical Exam   Blood pressure 130/83, pulse (!) 107, temperature 98.4 F (36.9 C), temperature source Oral, resp. rate 17, weight 145 lb (65.8 kg), last menstrual period 06/28/2017, SpO2 99 %, unknown if currently breastfeeding.  Physical Exam  Nursing note and vitals reviewed. Constitutional: She is oriented to person, place, and time. She appears well-developed and well-nourished.  HENT:  Head: Normocephalic.  Eyes: EOM are normal.  Neck: Neck supple.  GI: Soft. There is no tenderness. There is no rebound and no  guarding.  FHTs heard by doppler by RN  Genitourinary:  Genitourinary Comments: Speculum exam: Vagina - Small amount of white discharge, no odor, no pooling, no leaking with valsalva Cervix - No contact bleeding Bimanual exam: Cervix internal os closed Wet prep done Chaperone present for exam.   Musculoskeletal: Normal range of motion.  Neurological: She is alert and oriented to person, place, and time.  Skin: Skin is warm and dry.  Psychiatric: She has a normal mood and affect.    MAU Course  Procedures Results for orders placed or performed during the hospital encounter of 12/06/17 (from the past 24 hour(s))  [facility]     Status: None   Collection Time: 12/06/17  4:35 PM  Result Value Ref Range   Amnisure ROM NEGATIVE   Wet prep, genital     Status:  Abnormal   Collection Time: 12/06/17  4:35 PM  Result Value Ref Range   Yeast Wet Prep HPF POC NONE SEEN NONE SEEN   Trich, Wet Prep NONE SEEN NONE SEEN   Clue Cells Wet Prep HPF POC NONE SEEN NONE SEEN   WBC, Wet Prep HPF POC MODERATE (A) NONE SEEN   Sperm NONE SEEN     MDM No evidence of ROM.  Likely the leaking is from increased vaginal discharge.  And client has recently treated for yeast but vaginal tissue still very pink so there is question as to whether the yeast was fully cleared.  No yeast seen on wet prep but due to the fluid felt today, the yeast infection may be returning - will prescribe medication in case she becomes more symptomatic and needs to use yeast cream again.  No fetal monitoring needed today other than Doppler FHTs as client is 22 weeks and has no other complaints other than the possible leaking.  Assessment and Plan  No ROM - no clinical evidence of ROM and amnisure is negative Twin Gestation Possible yeast infection  Plan Keep your appointments in the office Get the medication for yeast (Terazol cream)  from your pharmacy and use again if your symptoms worsen.   Courtney [name] 12/06/2017, 4:44 PM

## 2017-12-06 NOTE — MAU Note (Signed)
Little bit of watery d/c early this morning, none.  No pain, no bleeding. Last intercourse was 2 days ago

## 2017-12-06 NOTE — MAU Note (Signed)
Urine sent to lab 

## 2017-12-06 NOTE — Discharge Instructions (Signed)
Keep your appointments in the office. Your water has not broken. You may be starting back with the yeast infection.  If your symptoms worsen, pick up the cream from the pharmacy and use vaginally as directed.

## 2017-12-08 ENCOUNTER — Ambulatory Visit (HOSPITAL_COMMUNITY)
Admission: RE | Admit: 2017-12-08 | Discharge: 2017-12-08 | Disposition: A | Discharge status: Home / Self Care | Payer: Medicaid Other | Source: Ambulatory Visit | Attending: Family Medicine | Admitting: Family Medicine

## 2017-12-08 ENCOUNTER — Encounter (HOSPITAL_COMMUNITY): Payer: Self-pay

## 2017-12-08 DIAGNOSIS — O30032 Twin pregnancy, monochorionic/diamniotic, second trimester: Secondary | ICD-10-CM | POA: Insufficient documentation

## 2017-12-08 DIAGNOSIS — Z3A22 22 weeks gestation of pregnancy: Secondary | ICD-10-CM | POA: Insufficient documentation

## 2017-12-08 DIAGNOSIS — O30039 Twin pregnancy, monochorionic/diamniotic, unspecified trimester: Secondary | ICD-10-CM

## 2017-12-08 NOTE — Addendum Note (Signed)
Encounter addended by: Earley Brookealrymple, Angello Chien S on: 12/08/2017 10:33 AM  Actions taken: Imaging Exam ended

## 2017-12-08 NOTE — Addendum Note (Signed)
Encounter addended by: Jarelly Rinck H, RT, RVT, RDMS on: 12/08/2017 10:34 AM  Actions taken: Imaging Exam ended

## 2017-12-15 ENCOUNTER — Ambulatory Visit (INDEPENDENT_AMBULATORY_CARE_PROVIDER_SITE_OTHER): Payer: BLUE CROSS/BLUE SHIELD | Admitting: Obstetrics & Gynecology

## 2017-12-15 VITALS — BP 119/78 | HR 95 | Wt 144.7 lb

## 2017-12-15 DIAGNOSIS — O0990 Supervision of high risk pregnancy, unspecified, unspecified trimester: Secondary | ICD-10-CM

## 2017-12-15 DIAGNOSIS — O30039 Twin pregnancy, monochorionic/diamniotic, unspecified trimester: Secondary | ICD-10-CM

## 2017-12-15 DIAGNOSIS — O10919 Unspecified pre-existing hypertension complicating pregnancy, unspecified trimester: Secondary | ICD-10-CM

## 2017-12-15 LAB — POCT URINALYSIS DIP (DEVICE)
Bilirubin Urine: NEGATIVE
Glucose, UA: NEGATIVE mg/dL
Hgb urine dipstick: NEGATIVE
Ketones, ur: NEGATIVE mg/dL
NITRITE: NEGATIVE
Protein, ur: NEGATIVE mg/dL
Specific Gravity, Urine: 1.025 (ref 1.005–1.030)
UROBILINOGEN UA: 0.2 mg/dL (ref 0.0–1.0)
pH: 7 (ref 5.0–8.0)

## 2017-12-15 NOTE — Patient Instructions (Addendum)
Return to clinic for any scheduled appointments or obstetric concerns, or go to MAU for evaluation  TDaP Vaccine Pregnancy Get the Whooping Cough Vaccine While You Are Pregnant (CDC)  It is important for women to get the whooping cough vaccine in the third trimester of each pregnancy. Vaccines are the best way to prevent this disease. There are 2 different whooping cough vaccines. Both vaccines combine protection against whooping cough, tetanus and diphtheria, but they are for different age groups: Tdap: for everyone 11 years or older, including pregnant women  DTaP: for children 2 months through 6 years of age  You need the whooping cough vaccine during each of your pregnancies The recommended time to get the shot is during your 27th through 36th week of pregnancy, preferably during the earlier part of this time period. The Centers for Disease Control and Prevention (CDC) recommends that pregnant women receive the whooping cough vaccine for adolescents and adults (called Tdap vaccine) during the third trimester of each pregnancy. The recommended time to get the shot is during your 27th through 36th week of pregnancy, preferably during the earlier part of this time period. This replaces the original recommendation that pregnant women get the vaccine only if they had not previously received it. The American College of Obstetricians and Gynecologists and the American College of Nurse-Midwives support this recommendation.  You should get the whooping cough vaccine while pregnant to pass protection to your baby frame support disabled and/or not supported in this browser  Learn why Laura decided to get the whooping cough vaccine in her 3rd trimester of pregnancy and how her baby girl was born with some protection against the disease. Also available on YouTube. After receiving the whooping cough vaccine, your body will create protective antibodies (proteins produced by the body to fight off diseases) and  pass some of them to your baby before birth. These antibodies provide your baby some short-term protection against whooping cough in early life. These antibodies can also protect your baby from some of the more serious complications that come along with whooping cough. Your protective antibodies are at their highest about 2 weeks after getting the vaccine, but it takes time to pass them to your baby. So the preferred time to get the whooping cough vaccine is early in your third trimester. The amount of whooping cough antibodies in your body decreases over time. That is why CDC recommends you get a whooping cough vaccine during each pregnancy. Doing so allows each of your babies to get the greatest number of protective antibodies from you. This means each of your babies will get the best protection possible against this disease.  Getting the whooping cough vaccine while pregnant is better than getting the vaccine after you give birth Whooping cough vaccination during pregnancy is ideal so your baby will have short-term protection as soon as he is born. This early protection is important because your baby will not start getting his whooping cough vaccines until he is 2 months old. These first few months of life are when your baby is at greatest risk for catching whooping cough. This is also when he's at greatest risk for having severe, potentially life-threating complications from the infection. To avoid that gap in protection, it is best to get a whooping cough vaccine during pregnancy. You will then pass protection to your baby before he is born. To continue protecting your baby, he should get whooping cough vaccines starting at 2 months old. You may never have gotten the   Tdap vaccine before and did not get it during this pregnancy. If so, you should make sure to get the vaccine immediately after you give birth, before leaving the hospital or birthing center. It will take about 2 weeks before your body  develops protection (antibodies) in response to the vaccine. Once you have protection from the vaccine, you are less likely to give whooping cough to your newborn while caring for him. But remember, your baby will still be at risk for catching whooping cough from others. A recent study looked to see how effective Tdap was at preventing whooping cough in babies whose mothers got the vaccine while pregnant or in the hospital after giving birth. The study found that getting Tdap between 27 through 36 weeks of pregnancy is 85% more effective at preventing whooping cough in babies younger than 2 months old. Blood tests cannot tell if you need a whooping cough vaccine There are no blood tests that can tell you if you have enough antibodies in your body to protect yourself or your baby against whooping cough. Even if you have been sick with whooping cough in the past or previously received the vaccine, you still should get the vaccine during each pregnancy. Breastfeeding may pass some protective antibodies onto your baby By breastfeeding, you may pass some antibodies you have made in response to the vaccine to your baby. When you get a whooping cough vaccine during your pregnancy, you will have antibodies in your breast milk that you can share with your baby as soon as your milk comes in. However, your baby will not get protective antibodies immediately if you wait to get the whooping cough vaccine until after delivering your baby. This is because it takes about 2 weeks for your body to create antibodies. Learn more about the health benefits of breastfeeding. 

## 2017-12-15 NOTE — Progress Notes (Signed)
   PRENATAL VISIT NOTE  Subjective:  Courtney [name] is a [age] y.o. G3P1011 at 6130w3d being seen today for ongoing prenatal care.  She is currently monitored for the following issues for this high-risk pregnancy and has Chronic hypertension during pregnancy, antepartum; Supervision of high risk pregnancy, antepartum; History of prior pregnancy with SGA newborn; and Monochorionic diamniotic twin pregnancy, antepartum on their problem list.  Patient reports no complaints.  Contractions: Not present. Vag. Bleeding: None.  Movement: Present. Denies leaking of fluid.   The following portions of the patient's history were reviewed and updated as appropriate: allergies, current medications, past family history, past medical history, past social history, past surgical history and problem list. Problem list updated.  Objective:   Vitals:   12/15/17 1008  BP: 119/78  Pulse: 95  Weight: 144 lb 11.2 oz (65.6 kg)    Fetal Status: Fetal Heart Rate (bpm): 131/144   Movement: Present     General:  Alert, oriented and cooperative. Patient is in no acute distress.  Skin: Skin is warm and dry. No rash noted.   Cardiovascular: Normal heart rate noted  Respiratory: Normal respiratory effort, no problems with respiration noted  Abdomen: Soft, gravid, appropriate for gestational age.  Pain/Pressure: Present     Pelvic: Cervical exam deferred        Extremities: Normal range of motion.  Edema: None  Mental Status:  Normal mood and affect. Normal behavior. Normal judgment and thought content.   Assessment and Plan:  Pregnancy: G3P1011 at 6230w3d  1. Monochorionic diamniotic twin pregnancy, antepartum Normal first screen x 2, normal anatomy. Continue MFM scans - AFP, Serum, Open Spina Bifida  2. Chronic hypertension during pregnancy, antepartum Stable BP, no meds. Continue ASA.  3. Supervision of high risk pregnancy, antepartum Missing prenatal labs and TOC for Trichomonas done today. - Hemoglobinopathy  evaluation - Cystic Fibrosis Mutation 97 - SMN1 COPY NUMBER ANALYSIS (SMA Carrier Screen) - Urine cytology ancillary only Preterm labor symptoms and general obstetric precautions including but not limited to vaginal bleeding, contractions, leaking of fluid and fetal movement were reviewed in detail with the patient. Please refer to After Visit Summary for other counseling recommendations.  Return in about 4 weeks (around 01/12/2018) for 2 hr GTT, 3rd trimester labs, TDap, OB Visit (HOB).   Jaynie CollinsUgonna Farmer Mccahill, MD

## 2017-12-17 ENCOUNTER — Telehealth: Payer: Self-pay | Admitting: General Practice

## 2017-12-17 NOTE — Telephone Encounter (Signed)
Patient called and left message on nurse line stating she has a question. Patient would like to know if it's normal to have a lot of braxton hicks contractions. Called patient, no answer- left message on voicemail stating we are trying to reach you to return your phone call, please call us back. Will send mychart message

## 2017-12-20 ENCOUNTER — Telehealth: Payer: Self-pay | Admitting: *Deleted

## 2017-12-20 NOTE — Telephone Encounter (Signed)
[name] called 12/17/16 and left a message she missed a call from the nurse.

## 2017-12-22 ENCOUNTER — Encounter (HOSPITAL_COMMUNITY): Payer: Self-pay

## 2017-12-22 ENCOUNTER — Ambulatory Visit (HOSPITAL_COMMUNITY)
Admission: RE | Admit: 2017-12-22 | Discharge: 2017-12-22 | Disposition: A | Discharge status: Home / Self Care | Payer: Medicaid Other | Source: Ambulatory Visit | Attending: Family Medicine | Admitting: Family Medicine

## 2017-12-22 ENCOUNTER — Other Ambulatory Visit (HOSPITAL_COMMUNITY): Payer: Self-pay | Admitting: *Deleted

## 2017-12-22 DIAGNOSIS — O321XX2 Maternal care for breech presentation, fetus 2: Secondary | ICD-10-CM | POA: Insufficient documentation

## 2017-12-22 DIAGNOSIS — Z3A24 24 weeks gestation of pregnancy: Secondary | ICD-10-CM | POA: Insufficient documentation

## 2017-12-22 DIAGNOSIS — O321XX1 Maternal care for breech presentation, fetus 1: Secondary | ICD-10-CM | POA: Insufficient documentation

## 2017-12-22 DIAGNOSIS — O30039 Twin pregnancy, monochorionic/diamniotic, unspecified trimester: Secondary | ICD-10-CM

## 2017-12-22 DIAGNOSIS — O30032 Twin pregnancy, monochorionic/diamniotic, second trimester: Secondary | ICD-10-CM | POA: Diagnosis present

## 2017-12-23 LAB — SMN1 COPY NUMBER ANALYSIS (SMA CARRIER SCREENING)

## 2017-12-23 LAB — AFP, SERUM, OPEN SPINA BIFIDA
AFP MoM: 2.22
AFP VALUE AFPOSL: 195.3 ng/mL
GEST. AGE ON COLLECTION DATE: 23.4 wk
Maternal Age At EDD: 25.7 yr
OSBR RISK 1 IN: 2238
Test Results:: NEGATIVE
WEIGHT: 144 [lb_av]

## 2017-12-23 LAB — HEMOGLOBINOPATHY EVALUATION
HEMOGLOBIN A2 QUANTITATION: 2.1 % (ref 1.8–3.2)
HEMOGLOBIN F QUANTITATION: 0 % (ref 0.0–2.0)
HGB C: 0 %
HGB S: 0 %
HGB VARIANT: 0 %
Hgb A: 97.9 % (ref 96.4–98.8)

## 2017-12-23 LAB — CYSTIC FIBROSIS MUTATION 97: Interpretation: NOT DETECTED

## 2017-12-24 NOTE — Telephone Encounter (Signed)
Per review from patient previous call nurse had left a phone message and sent a MyChart message. I called Courtney [name] and she states she did get those message . She also states she is having the contractions less now-maybe 1 or 2 an hour. We discussed if she has 4 or more in an hour x2 hours she should come to MAU to be evaluated since she has twins. I answered her questions and she voices understanding.

## 2018-01-05 ENCOUNTER — Ambulatory Visit (HOSPITAL_COMMUNITY)
Admission: RE | Admit: 2018-01-05 | Discharge: 2018-01-05 | Disposition: A | Discharge status: Home / Self Care | Payer: Medicaid Other | Source: Ambulatory Visit | Attending: Family Medicine | Admitting: Family Medicine

## 2018-01-05 ENCOUNTER — Other Ambulatory Visit (HOSPITAL_COMMUNITY): Payer: Self-pay | Admitting: Obstetrics and Gynecology

## 2018-01-05 ENCOUNTER — Encounter (HOSPITAL_COMMUNITY): Payer: Self-pay

## 2018-01-05 DIAGNOSIS — O10012 Pre-existing essential hypertension complicating pregnancy, second trimester: Secondary | ICD-10-CM | POA: Diagnosis not present

## 2018-01-05 DIAGNOSIS — Z3A26 26 weeks gestation of pregnancy: Secondary | ICD-10-CM

## 2018-01-05 DIAGNOSIS — O09299 Supervision of pregnancy with other poor reproductive or obstetric history, unspecified trimester: Secondary | ICD-10-CM

## 2018-01-05 DIAGNOSIS — O09292 Supervision of pregnancy with other poor reproductive or obstetric history, second trimester: Secondary | ICD-10-CM | POA: Diagnosis not present

## 2018-01-05 DIAGNOSIS — O30032 Twin pregnancy, monochorionic/diamniotic, second trimester: Secondary | ICD-10-CM | POA: Diagnosis not present

## 2018-01-05 DIAGNOSIS — O30039 Twin pregnancy, monochorionic/diamniotic, unspecified trimester: Secondary | ICD-10-CM

## 2018-01-13 ENCOUNTER — Other Ambulatory Visit: Payer: Self-pay

## 2018-01-13 DIAGNOSIS — O0990 Supervision of high risk pregnancy, unspecified, unspecified trimester: Secondary | ICD-10-CM

## 2018-01-14 ENCOUNTER — Other Ambulatory Visit: Payer: BLUE CROSS/BLUE SHIELD

## 2018-01-14 ENCOUNTER — Ambulatory Visit (INDEPENDENT_AMBULATORY_CARE_PROVIDER_SITE_OTHER): Payer: BLUE CROSS/BLUE SHIELD | Admitting: Obstetrics & Gynecology

## 2018-01-14 VITALS — BP 127/85 | HR 108 | Wt 148.8 lb

## 2018-01-14 DIAGNOSIS — O0990 Supervision of high risk pregnancy, unspecified, unspecified trimester: Secondary | ICD-10-CM

## 2018-01-14 DIAGNOSIS — O10912 Unspecified pre-existing hypertension complicating pregnancy, second trimester: Secondary | ICD-10-CM

## 2018-01-14 DIAGNOSIS — Z23 Encounter for immunization: Secondary | ICD-10-CM

## 2018-01-14 DIAGNOSIS — O10919 Unspecified pre-existing hypertension complicating pregnancy, unspecified trimester: Secondary | ICD-10-CM

## 2018-01-14 DIAGNOSIS — O30032 Twin pregnancy, monochorionic/diamniotic, second trimester: Secondary | ICD-10-CM

## 2018-01-14 DIAGNOSIS — O30039 Twin pregnancy, monochorionic/diamniotic, unspecified trimester: Secondary | ICD-10-CM

## 2018-01-14 DIAGNOSIS — O0992 Supervision of high risk pregnancy, unspecified, second trimester: Secondary | ICD-10-CM

## 2018-01-14 LAB — POCT URINALYSIS DIP (DEVICE)
Bilirubin Urine: NEGATIVE
GLUCOSE, UA: NEGATIVE mg/dL
Hgb urine dipstick: NEGATIVE
KETONES UR: NEGATIVE mg/dL
Leukocytes, UA: NEGATIVE
Nitrite: NEGATIVE
PROTEIN: NEGATIVE mg/dL
SPECIFIC GRAVITY, URINE: 1.015 (ref 1.005–1.030)
Urobilinogen, UA: 0.2 mg/dL (ref 0.0–1.0)
pH: 7 (ref 5.0–8.0)

## 2018-01-14 NOTE — Patient Instructions (Signed)
Return to clinic for any scheduled appointments or obstetric concerns, or go to [facility] for evaluation   Third Trimester of Pregnancy The third trimester is from week 28 through week 40 (months 7 through 9). The third trimester is a time when the unborn baby (fetus) is growing rapidly. At the end of the ninth month, the fetus is about 20 inches in length and weighs 6-10 pounds. Body changes during your third trimester Your body will continue to go through many changes during pregnancy. The changes vary from woman to woman. During the third trimester:  Your weight will continue to increase. You can expect to gain 25-35 pounds (11-16 kg) by the end of the pregnancy.  You may begin to get stretch marks on your hips, abdomen, and breasts.  You may urinate more often because the fetus is moving lower into your pelvis and pressing on your bladder.  You may develop or continue to have heartburn. This is caused by increased hormones that slow down muscles in the digestive tract.  You may develop or continue to have constipation because increased hormones slow digestion and cause the muscles that push waste through your intestines to relax.  You may develop hemorrhoids. These are swollen veins (varicose veins) in the rectum that can itch or be painful.  You may develop swollen, bulging veins (varicose veins) in your legs.  You may have increased body aches in the pelvis, back, or thighs. This is due to weight gain and increased hormones that are relaxing your joints.  You may have changes in your hair. These can include thickening of your hair, rapid growth, and changes in texture. Some women also have hair loss during or after pregnancy, or hair that feels dry or thin. Your hair will most likely return to normal after your baby is born.  Your breasts will continue to grow and they will continue to become tender. A yellow fluid (colostrum) may leak from your breasts. This is the first milk you are  producing for your baby.  Your belly button may stick out.  You may notice more swelling in your hands, face, or ankles.  You may have increased tingling or numbness in your hands, arms, and legs. The skin on your belly may also feel numb.  You may feel short of breath because of your expanding uterus.  You may have more problems sleeping. This can be caused by the size of your belly, increased need to urinate, and an increase in your body's metabolism.  You may notice the fetus "dropping," or moving lower in your abdomen (lightening).  You may have increased vaginal discharge.  You may notice your joints feel loose and you may have pain around your pelvic bone.  What to expect at prenatal visits You will have prenatal exams every 2 weeks until week 36. Then you will have weekly prenatal exams. During a routine prenatal visit:  You will be weighed to make sure you and the baby are growing normally.  Your blood pressure will be taken.  Your abdomen will be measured to track your baby's growth.  The fetal heartbeat will be listened to.  Any test results from the previous visit will be discussed.  You may have a cervical check near your due date to see if your cervix has softened or thinned (effaced).  You will be tested for Group B streptococcus. This happens between 35 and 37 weeks.  Your health care provider may ask you:  What your birth plan is.    How you are feeling.  If you are feeling the baby move.  If you have had any abnormal symptoms, such as leaking fluid, bleeding, severe headaches, or abdominal cramping.  If you are using any tobacco products, including cigarettes, chewing tobacco, and electronic cigarettes.  If you have any questions.  Other tests or screenings that may be performed during your third trimester include:  Blood tests that check for low iron levels (anemia).  Fetal testing to check the health, activity level, and growth of the fetus.  Testing is done if you have certain medical conditions or if there are problems during the pregnancy.  Nonstress test (NST). This test checks the health of your baby to make sure there are no signs of problems, such as the baby not getting enough oxygen. During this test, a belt is placed around your belly. The baby is made to move, and its heart rate is monitored during movement.  What is false labor? False labor is a condition in which you feel small, irregular tightenings of the muscles in the womb (contractions) that usually go away with rest, changing position, or drinking water. These are called Braxton Hicks contractions. Contractions may last for hours, days, or even weeks before true labor sets in. If contractions come at regular intervals, become more frequent, increase in intensity, or become painful, you should see your health care provider. What are the signs of labor?  Abdominal cramps.  Regular contractions that start at 10 minutes apart and become stronger and more frequent with time.  Contractions that start on the top of the uterus and spread down to the lower abdomen and back.  Increased pelvic pressure and dull back pain.  A watery or bloody mucus discharge that comes from the vagina.  Leaking of amniotic fluid. This is also known as your "water breaking." It could be a slow trickle or a gush. Let your health care provider know if it has a color or strange odor. If you have any of these signs, call your health care provider right away, even if it is before your due date. Follow these instructions at home: Medicines  Follow your health care provider's instructions regarding medicine use. Specific medicines may be either safe or unsafe to take during pregnancy.  Take a prenatal vitamin that contains at least 600 micrograms (mcg) of folic acid.  If you develop constipation, try taking a stool softener if your health care provider approves. Eating and drinking  Eat a  balanced diet that includes fresh fruits and vegetables, whole grains, good sources of protein such as meat, eggs, or tofu, and low-fat dairy. Your health care provider will help you determine the amount of weight gain that is right for you.  Avoid raw meat and uncooked cheese. These carry germs that can cause birth defects in the baby.  If you have low calcium intake from food, talk to your health care provider about whether you should take a daily calcium supplement.  Eat four or five small meals rather than three large meals a day.  Limit foods that are high in fat and processed sugars, such as fried and sweet foods.  To prevent constipation: ? Drink enough fluid to keep your urine clear or pale yellow. ? Eat foods that are high in fiber, such as fresh fruits and vegetables, whole grains, and beans. Activity  Exercise only as directed by your health care provider. Most women can continue their usual exercise routine during pregnancy. Try to exercise for 30   minutes at least 5 days a week. Stop exercising if you experience uterine contractions.  Avoid heavy lifting.  Do not exercise in extreme heat or humidity, or at high altitudes.  Wear low-heel, comfortable shoes.  Practice good posture.  You may continue to have sex unless your health care provider tells you otherwise. Relieving pain and discomfort  Take frequent breaks and rest with your legs elevated if you have leg cramps or low back pain.  Take warm sitz baths to soothe any pain or discomfort caused by hemorrhoids. Use hemorrhoid cream if your health care provider approves.  Wear a good support bra to prevent discomfort from breast tenderness.  If you develop varicose veins: ? Wear support pantyhose or compression stockings as told by your healthcare provider. ? Elevate your feet for 15 minutes, 3-4 times a day. Prenatal care  Write down your questions. Take them to your prenatal visits.  Keep all your prenatal  visits as told by your health care provider. This is important. Safety  Wear your seat belt at all times when driving.  Make a list of emergency phone numbers, including numbers for family, friends, the hospital, and police and fire departments. General instructions  Avoid cat litter boxes and soil used by cats. These carry germs that can cause birth defects in the baby. If you have a cat, ask someone to clean the litter box for you.  Do not travel far distances unless it is absolutely necessary and only with the approval of your health care provider.  Do not use hot tubs, steam rooms, or saunas.  Do not drink alcohol.  Do not use any products that contain nicotine or tobacco, such as cigarettes and e-cigarettes. If you need help quitting, ask your health care provider.  Do not use any medicinal herbs or unprescribed drugs. These chemicals affect the formation and growth of the baby.  Do not douche or use tampons or scented sanitary pads.  Do not cross your legs for long periods of time.  To prepare for the arrival of your baby: ? Take prenatal classes to understand, practice, and ask questions about labor and delivery. ? Make a trial run to the hospital. ? Visit the hospital and tour the maternity area. ? Arrange for maternity or paternity leave through employers. ? Arrange for family and friends to take care of pets while you are in the hospital. ? Purchase a rear-facing car seat and make sure you know how to install it in your car. ? Pack your hospital bag. ? Prepare the baby's nursery. Make sure to remove all pillows and stuffed animals from the baby's crib to prevent suffocation.  Visit your dentist if you have not gone during your pregnancy. Use a soft toothbrush to brush your teeth and be gentle when you floss. Contact a health care provider if:  You are unsure if you are in labor or if your water has broken.  You become dizzy.  You have mild pelvic cramps, pelvic  pressure, or nagging pain in your abdominal area.  You have lower back pain.  You have persistent nausea, vomiting, or diarrhea.  You have an unusual or bad smelling vaginal discharge.  You have pain when you urinate. Get help right away if:  Your water breaks before 37 weeks.  You have regular contractions less than 5 minutes apart before 37 weeks.  You have a fever.  You are leaking fluid from your vagina.  You have spotting or bleeding from your vagina.    You have severe abdominal pain or cramping.  You have rapid weight loss or weight gain.  You have shortness of breath with chest pain.  You notice sudden or extreme swelling of your face, hands, ankles, feet, or legs.  Your baby makes fewer than 10 movements in 2 hours.  You have severe headaches that do not go away when you take medicine.  You have vision changes. Summary  The third trimester is from week 28 through week 40, months 7 through 9. The third trimester is a time when the unborn baby (fetus) is growing rapidly.  During the third trimester, your discomfort may increase as you and your baby continue to gain weight. You may have abdominal, leg, and back pain, sleeping problems, and an increased need to urinate.  During the third trimester your breasts will keep growing and they will continue to become tender. A yellow fluid (colostrum) may leak from your breasts. This is the first milk you are producing for your baby.  False labor is a condition in which you feel small, irregular tightenings of the muscles in the womb (contractions) that eventually go away. These are called Braxton Hicks contractions. Contractions may last for hours, days, or even weeks before true labor sets in.  Signs of labor can include: abdominal cramps; regular contractions that start at 10 minutes apart and become stronger and more frequent with time; watery or bloody mucus discharge that comes from the vagina; increased pelvic pressure  and dull back pain; and leaking of amniotic fluid. This information is not intended to replace advice given to you by your health care provider. Make sure you discuss any questions you have with your health care provider. Document Released: 09/29/2001 Document Revised: 03/12/2016 Document Reviewed: 12/06/2012 [employer] Interactive Patient Education  2017 [employer]  

## 2018-01-14 NOTE — Progress Notes (Signed)
PRENATAL VISIT NOTE  Subjective:  Courtney [name] is a 27 y.o. G3P1011 at 7467w5d being seen today for ongoing prenatal care.  She is currently monitored for the following issues for this high-risk pregnancy and has Chronic hypertension during pregnancy, antepartum; Supervision of high risk pregnancy, antepartum; History of prior pregnancy with SGA newborn; and Monochorionic diamniotic twin pregnancy, antepartum on their problem list.  Patient reports no complaints.  Contractions: Irregular. Vag. Bleeding: None.  Movement: Present. Denies leaking of fluid.   The following portions of the patient's history were reviewed and updated as appropriate: allergies, current medications, past family history, past medical history, past social history, past surgical history and problem list. Problem list updated.  Objective:   Vitals:   01/14/18 1045  BP: 127/85  Pulse: (!) 108  Weight: 148 lb 12.8 oz (67.5 kg)    Fetal Status: Fetal Heart Rate (bpm): 136/140   Movement: Present     General:  Alert, oriented and cooperative. Patient is in no acute distress.  Skin: Skin is warm and dry. No rash noted.   Cardiovascular: Normal heart rate noted  Respiratory: Normal respiratory effort, no problems with respiration noted  Abdomen: Soft, gravid, appropriate for gestational age.  Pain/Pressure: Present     Pelvic: Cervical exam deferred        Extremities: Normal range of motion.  Edema: None  Mental Status:  Normal mood and affect. Normal behavior. Normal judgment and thought content.   [place] Mfm Ob Follow Up  Result Date: 12/22/2017 ----------------------------------------------------------------------  OBSTETRICS REPORT                      (Signed Final 12/22/2017 10:19 am) ---------------------------------------------------------------------- Patient Info  ID #:       [id number]                          D.O.B.:  [birth year] (26 yrs)  Name:       Courtney [name]                 Visit Date: 12/22/2017 09:04  am ---------------------------------------------------------------------- Performed By  Performed By:     Tomma Lightningevin Vics             Ref. Address:     [facility]                    [facility]                                                             OB[facility]Gyn Clinic                                                             [address]                                                             [place], [place]                                                             [address]  Attending:        Charlsie Merles MD         Location:         [facility]  Referred By:      [facility] for                    [facility]                    Healthcare ---------------------------------------------------------------------- Orders   #  Description                                 Code   1  [place] MFM OB FOLLOW UP                         60454.09   2  [place] MFM OB FOLLOW UP ADDL GEST               81191.47  ----------------------------------------------------------------------   #  Ordered By               Order #        Accession #    Episode #   1  MARK Ezzard Standing              [id number]      [id number]     [id number]   2  MARK NEWMAN              [id number]      [id number]     [id number]  ---------------------------------------------------------------------- Indications   [redacted] weeks gestation of pregnancy                Z3A.24   Twin pregnancy, mono/di, second trimester      O30.032   Poor obstetric history: Previous fetal growth  O09.299   restriction (FGR)   Hypertension - Chronic/Pre-existing            O10.019   Encounter for other antenatal screening        Z36.2   follow-up  ---------------------------------------------------------------------- OB History  Gravidity:    3         Term:   1        Prem:   0        SAB:   1  TOP:          0       Ectopic:  0        Living: 1 ----------------------------------------------------------------------  Fetal Evaluation (Fetus A)  Num Of Fetuses:  2  Fetal Heart         136  Rate(bpm):  Cardiac Activity:   Observed  Fetal Lie:          Maternal left side  Presentation:       Breech  Placenta:           Posterior, above cervical os  P. Cord Insertion:  Previously Visualized  Membrane Desc:      Dividing Membrane seen  Amniotic Fluid  AFI FV:      Subjectively within normal limits                              Largest Pocket(cm)                              6.37 ---------------------------------------------------------------------- Biometry (Fetus A)  BPD:      59.2  mm     G. Age:  24w 1d         39  %    CI:        69.91   %    70 - 86                                                          FL/HC:      19.6   %    18.7 - 20.9  HC:      225.9  mm     G. Age:  24w 4d         44  %    HC/AC:      1.23        1.05 - 1.21  AC:      184.1  mm     G. Age:  23w 1d         14  %    FL/BPD:     74.8   %    71 - 87  FL:       44.3  mm     G. Age:  24w 4d         47  %    FL/AC:      24.1   %    20 - 24  HUM:      42.1  mm     G. Age:  25w 2d         67  %  Est. FW:     642  gm      1 lb 7 oz     44  %     FW Discordancy      0 \ 1 % ---------------------------------------------------------------------- Gestational Age (Fetus A)  LMP:           25w 2d        Date:  06/28/17                 EDD:   04/04/18  U/S Today:     24w 1d                                        EDD:  04/12/18  Best:          24w 2d     Det. ByMarcella Dubs         EDD:   04/11/18                                      (09/04/17) ---------------------------------------------------------------------- Anatomy (Fetus A)  Cranium:               Appears normal         Aortic Arch:            Previously seen  Cavum:                 Appears normal         Ductal Arch:            Previously seen  Ventricles:            Appears normal         Diaphragm:              Previously seen  Choroid Plexus:        Previously seen        Stomach:                 Appears normal, left                                                                        sided  Cerebellum:            Previously seen        Abdomen:                Appears normal  Posterior Fossa:       Previously seen        Abdominal Wall:         Previously seen  Nuchal Fold:           Previously seen        Cord Vessels:           Previously seen  Face:                  Orbits and profile     Kidneys:                Appear normal                         previously seen  Lips:                  Previously seen        Bladder:                Appears normal  Thoracic:              Appears normal         Spine:                  Previously seen  Heart:                 Appears normal  Upper Extremities:      Previously seen                         (4CH, axis, and situs  RVOT:                  Appears normal         Lower Extremities:      Previously seen  LVOT:                  Appears normal  Other:  Fetus appears to be a female. ---------------------------------------------------------------------- Fetal Evaluation (Fetus B)  Num Of Fetuses:     2  Fetal Heart         142  Rate(bpm):  Cardiac Activity:   Observed  Fetal Lie:          Maternal right side  Presentation:       Breech  Placenta:           Posterior, above cervical os  P. Cord Insertion:  Previously Visualized  Membrane Desc:      Dividing Membrane seen  Amniotic Fluid  AFI FV:      Subjectively within normal limits                              Largest Pocket(cm)                              5.94 ---------------------------------------------------------------------- Biometry (Fetus B)  BPD:      59.4  mm     G. Age:  24w 2d         42  %    CI:        72.55   %    70 - 86                                                          FL/HC:      19.7   %    18.7 - 20.9  HC:      221.8  mm     G. Age:  24w 1d         29  %    HC/AC:      1.19        1.05 - 1.21  AC:       186   mm     G. Age:  23w 3d         17  %    FL/BPD:     73.6   %    71 - 87   FL:       43.7  mm     G. Age:  24w 3d         39  %    FL/AC:      23.5   %    20 - 24  HUM:      40.8  mm     G. Age:  24w 5d         53  %  Est. FW:     637  gm      1 lb 6  oz     43  %     FW Discordancy         1  % ---------------------------------------------------------------------- Gestational Age (Fetus B)  LMP:           25w 2d        Date:  06/28/17                 EDD:   04/04/18  U/S Today:     24w 1d                                        EDD:   04/12/18  Best:          24w 2d     Det. ByMarcella Dubs         EDD:   04/11/18                                      (09/04/17) ---------------------------------------------------------------------- Anatomy (Fetus B)  Cranium:               Appears normal         Aortic Arch:            Previously seen  Cavum:                 Appears normal         Ductal Arch:            Appears normal  Ventricles:            Appears normal         Diaphragm:              Appears normal  Choroid Plexus:        Previously seen        Stomach:                Appears normal, left                                                                        sided  Cerebellum:            Previously seen        Abdomen:                Appears normal  Posterior Fossa:       Previously seen        Abdominal Wall:         Previously seen  Nuchal Fold:           Previously seen        Cord Vessels:           Previously seen  Face:                  Orbits and profile     Kidneys:                Appear normal  previously seen  Lips:                  Previously seen        Bladder:                Appears normal  Thoracic:              Appears normal         Spine:                  Previously seen  Heart:                 Appears normal         Upper Extremities:      Previously seen                         (4CH, axis, and situs  RVOT:                  Not well visualized    Lower Extremities:      Previously seen  LVOT:                  Appears normal  Other:  Fetus  appears to be a female. ---------------------------------------------------------------------- Cervix Uterus Adnexa  Cervix  Length:           3.01  cm.  Normal appearance by transabdominal scan. ---------------------------------------------------------------------- Impression  Monochorionic/diamniotic twin pregnancy at 24+2 weeks  Twin A:  Presentation is breech  Interval review of fetal anatomy was normal, bladder and  stomach are seen  Normal amniotic fluid volume with MVP of 6.4 cm  Appropriate interval growth with EFW at the 44th percentile  Twin B:  Presentation is  Interval review of fetal anatomy was normal;  bladder and  stomach are seen  Normal amniotic fluid volume with MVP 5.9 cm  Appropriate interval growth with EFW at the 43rd percentile  Dicordance is 1% ---------------------------------------------------------------------- Recommendations  Continued scheduled TTTS and growth surveillance ----------------------------------------------------------------------                 Charlsie Merles, MD Electronically Signed Final Report   12/22/2017 10:19 am ----------------------------------------------------------------------  [place] Mfm Ob Follow Up Addl Gest  Result Date: 12/22/2017 ----------------------------------------------------------------------  OBSTETRICS REPORT                      (Signed Final 12/22/2017 10:19 am) ---------------------------------------------------------------------- Patient Info  ID #:       [id number]                          D.O.B.:  28-Aug-1991 (26 yrs)  Name:       Courtney [name]                 Visit Date: 12/22/2017 09:04 am ---------------------------------------------------------------------- Performed By  Performed By:     Tomma Lightning             Ref. Address:     [facility]                    [facility]  OB/Gyn Clinic                                                             [address]                                                             [place], [place]                                                             [address]  Attending:        Charlsie Merles MD         Location:         [facility]  Referred By:      [facility] for                    [facility]                    Healthcare ---------------------------------------------------------------------- Orders   #  Description                                 Code   1  [place] MFM OB FOLLOW UP                         [id number]   2  [place] MFM OB FOLLOW UP ADDL GEST               95621.30  ----------------------------------------------------------------------   #  Ordered By               Order #        Accession #    Episode #   1  MARK Ezzard Standing              [id number]      [id number]     [id number]   2  MARK NEWMAN              [id number]      [id number]     [id number]  ---------------------------------------------------------------------- Indications   [redacted] weeks gestation of pregnancy                Z3A.24   Twin pregnancy, mono/di, second trimester      O30.032   Poor obstetric history: Previous fetal growth  O09.299   restriction (FGR)   Hypertension - Chronic/Pre-existing            O10.019   Encounter for other antenatal screening        Z36.2   follow-up  ---------------------------------------------------------------------- OB History  Gravidity:    3         Term:   1        Prem:   0        SAB:   1  TOP:          0       Ectopic:  0        Living: 1 ---------------------------------------------------------------------- Fetal Evaluation (Fetus A)  Num Of Fetuses:     2  Fetal Heart         136  Rate(bpm):  Cardiac Activity:   Observed  Fetal Lie:          Maternal left side  Presentation:       Breech  Placenta:           Posterior, above cervical os  P. Cord Insertion:  Previously Visualized  Membrane Desc:      Dividing Membrane seen  Amniotic Fluid  AFI FV:      Subjectively  within normal limits                              Largest Pocket(cm)                              6.37 ---------------------------------------------------------------------- Biometry (Fetus A)  BPD:      59.2  mm     G. Age:  24w 1d         39  %    CI:        69.91   %    70 - 86                                                          FL/HC:      19.6   %    18.7 - 20.9  HC:      225.9  mm     G. Age:  24w 4d         44  %    HC/AC:      1.23        1.05 - 1.21  AC:      184.1  mm     G. Age:  23w 1d         14  %    FL/BPD:     74.8   %    71 - 87  FL:       44.3  mm     G. Age:  24w 4d         47  %    FL/AC:      24.1   %    20 - 24  HUM:      42.1  mm     G. Age:  25w 2d         67  %  Est. FW:     642  gm  1 lb 7 oz     44  %     FW Discordancy      0 \ 1 % ---------------------------------------------------------------------- Gestational Age (Fetus A)  LMP:           25w 2d        Date:  06/28/17                 EDD:   04/04/18  U/S Today:     24w 1d                                        EDD:   04/12/18  Best:          24w 2d     Det. ByMarcella Dubs         EDD:   04/11/18                                      (09/04/17) ---------------------------------------------------------------------- Anatomy (Fetus A)  Cranium:               Appears normal         Aortic Arch:            Previously seen  Cavum:                 Appears normal         Ductal Arch:            Previously seen  Ventricles:            Appears normal         Diaphragm:              Previously seen  Choroid Plexus:        Previously seen        Stomach:                Appears normal, left                                                                        sided  Cerebellum:            Previously seen        Abdomen:                Appears normal  Posterior Fossa:       Previously seen        Abdominal Wall:         Previously seen  Nuchal Fold:           Previously seen        Cord Vessels:           Previously seen  Face:                   Orbits and profile     Kidneys:                Appear normal  previously seen  Lips:                  Previously seen        Bladder:                Appears normal  Thoracic:              Appears normal         Spine:                  Previously seen  Heart:                 Appears normal         Upper Extremities:      Previously seen                         (4CH, axis, and situs  RVOT:                  Appears normal         Lower Extremities:      Previously seen  LVOT:                  Appears normal  Other:  Fetus appears to be a female. ---------------------------------------------------------------------- Fetal Evaluation (Fetus B)  Num Of Fetuses:     2  Fetal Heart         142  Rate(bpm):  Cardiac Activity:   Observed  Fetal Lie:          Maternal right side  Presentation:       Breech  Placenta:           Posterior, above cervical os  P. Cord Insertion:  Previously Visualized  Membrane Desc:      Dividing Membrane seen  Amniotic Fluid  AFI FV:      Subjectively within normal limits                              Largest Pocket(cm)                              5.94 ---------------------------------------------------------------------- Biometry (Fetus B)  BPD:      59.4  mm     G. Age:  24w 2d         42  %    CI:        72.55   %    70 - 86                                                          FL/HC:      19.7   %    18.7 - 20.9  HC:      221.8  mm     G. Age:  24w 1d         29  %    HC/AC:      1.19        1.05 - 1.21  AC:       186   mm     G. Age:  23w 3d  17  %    FL/BPD:     73.6   %    71 - 87  FL:       43.7  mm     G. Age:  24w 3d         39  %    FL/AC:      23.5   %    20 - 24  HUM:      40.8  mm     G. Age:  24w 5d         53  %  Est. FW:     637  gm      1 lb 6 oz     43  %     FW Discordancy         1  % ---------------------------------------------------------------------- Gestational Age (Fetus B)  LMP:           25w 2d        Date:  06/28/17                  EDD:   04/04/18  U/S Today:     24w 1d                                        EDD:   04/12/18  Best:          24w 2d     Det. ByMarcella Dubs         EDD:   04/11/18                                      (09/04/17) ---------------------------------------------------------------------- Anatomy (Fetus B)  Cranium:               Appears normal         Aortic Arch:            Previously seen  Cavum:                 Appears normal         Ductal Arch:            Appears normal  Ventricles:            Appears normal         Diaphragm:              Appears normal  Choroid Plexus:        Previously seen        Stomach:                Appears normal, left                                                                        sided  Cerebellum:            Previously seen        Abdomen:                Appears normal  Posterior Fossa:  Previously seen        Abdominal Wall:         Previously seen  Nuchal Fold:           Previously seen        Cord Vessels:           Previously seen  Face:                  Orbits and profile     Kidneys:                Appear normal                         previously seen  Lips:                  Previously seen        Bladder:                Appears normal  Thoracic:              Appears normal         Spine:                  Previously seen  Heart:                 Appears normal         Upper Extremities:      Previously seen                         (4CH, axis, and situs  RVOT:                  Not well visualized    Lower Extremities:      Previously seen  LVOT:                  Appears normal  Other:  Fetus appears to be a female. ---------------------------------------------------------------------- Cervix Uterus Adnexa  Cervix  Length:           3.01  cm.  Normal appearance by transabdominal scan. ---------------------------------------------------------------------- Impression  Monochorionic/diamniotic twin pregnancy at 24+2 weeks  Twin A:  Presentation is breech  Interval  review of fetal anatomy was normal, bladder and  stomach are seen  Normal amniotic fluid volume with MVP of 6.4 cm  Appropriate interval growth with EFW at the 44th percentile  Twin B:  Presentation is  Interval review of fetal anatomy was normal;  bladder and  stomach are seen  Normal amniotic fluid volume with MVP 5.9 cm  Appropriate interval growth with EFW at the 43rd percentile  Dicordance is 1% ---------------------------------------------------------------------- Recommendations  Continued scheduled TTTS and growth surveillance ----------------------------------------------------------------------                 Charlsie Merles, MD Electronically Signed Final Report   12/22/2017 10:19 am ----------------------------------------------------------------------  [place] Mfm Ob Limited  Result Date: 01/05/2018 ----------------------------------------------------------------------  OBSTETRICS REPORT                      (Signed Final 01/05/2018 09:59 am) ---------------------------------------------------------------------- Patient Info  ID #:       [id number]                          D.O.B.:  1991-06-06 (26 yrs)  Name:       Courtney [name]  Visit Date: 01/05/2018 09:01 am ---------------------------------------------------------------------- Performed By  Performed By:     Vivien Rota        Ref. Address:     [facility]                                                             OB/Gyn Clinic                                                             [address]                                                             [place], [place]                                                             [address]  Attending:        Charlsie Merles MD         Location:         [facility]  Referred By:      [facility] for                    [facility]                    Healthcare  ---------------------------------------------------------------------- Orders   #  Description                                 Code   1  [place] MFM OB LIMITED                           60454.09  ----------------------------------------------------------------------   #  Ordered By               Order #        Accession #    Episode #   1  MARK NEWMAN              [id number]  [id number]     [id number]  ---------------------------------------------------------------------- Indications   [redacted] weeks gestation of pregnancy                Z3A.26   Twin pregnancy, mono/di, second trimester      O30.032   Poor obstetric history: Previous fetal growth  O09.299   restriction (FGR)   Hypertension - Chronic/Pre-existing            O10.019  ---------------------------------------------------------------------- OB History  Gravidity:    3         Term:   1        Prem:   0        SAB:   1  TOP:          0       Ectopic:  0        Living: 1 ---------------------------------------------------------------------- Fetal Evaluation (Fetus A)  Num Of Fetuses:     2  Fetal Heart         146  Rate(bpm):  Cardiac Activity:   Observed  Fetal Lie:          Maternal left side  Presentation:       Cephalic  Placenta:           Posterior, above cervical os  P. Cord Insertion:  Previously Visualized  Membrane Desc:      Dividing Membrane seen - Monochorionic  Amniotic Fluid  AFI FV:      Subjectively within normal limits                              Largest Pocket(cm)                              5.17 ---------------------------------------------------------------------- Gestational Age (Fetus A)  LMP:           27w 2d        Date:  06/28/17                 EDD:   04/04/18  Best:          [place] 2d     Det. ByMarcella Dubs         EDD:   04/11/18                                      (09/04/17) ---------------------------------------------------------------------- Fetal Evaluation (Fetus B)  Num Of Fetuses:     2  Fetal Heart         133   Rate(bpm):  Cardiac Activity:   Observed  Fetal Lie:          Maternal right side  Presentation:       Breech  Placenta:           Posterior, above cervical os  P. Cord Insertion:  Previously Visualized  Membrane Desc:      Dividing Membrane seen - Monochorionic  Amniotic Fluid  AFI FV:      Subjectively within normal limits                              Largest Pocket(cm)  5.79 ---------------------------------------------------------------------- Gestational Age (Fetus B)  LMP:           27w 2d        Date:  06/28/17                 EDD:   04/04/18  Best:          [place] 2d     Det. ByMarcella Dubs         EDD:   04/11/18                                      (09/04/17) ---------------------------------------------------------------------- Cervix Uterus Adnexa  Cervix  Length:           3.82  cm.  Uterus  No abnormality visualized.  Left Ovary  Not visualized.  Right Ovary  Not visualized.  Adnexa:       No abnormality visualized. No adnexal mass                visualized. ---------------------------------------------------------------------- Impression  Monochorionic/diamniotic twin gestation at 26+2 weeks here  for TTTS surveillance  Normal fetal cardiac activity in both twins  Normal fetal movement from both twins  Normal amniotic fluid in both gestational sacs, with MVPs of  5.2 cm for A and 5.8 cm for B  Normal bladders and stomachs visualized in both twins ---------------------------------------------------------------------- Recommendations  Recommend follow-up ultrasound examination in 2 weeks for  growth evaluation and TTTSsurveillance ----------------------------------------------------------------------                 Charlsie Merles, MD Electronically Signed Final Report   01/05/2018 09:59 am ----------------------------------------------------------------------   Assessment and Plan:  Pregnancy: G3P1011 at [redacted]w[redacted]d  1. Monochorionic diamniotic twin pregnancy,  antepartum Concordant growths, continue follow up as per MFM  2. Chronic hypertension during pregnancy, antepartum Stable BP, no meds. Continue ASA.  3. Supervision of high risk pregnancy, antepartum 3 hr GTT, labs, Tdap today Preterm labor symptoms and general obstetric precautions including but not limited to vaginal bleeding, contractions, leaking of fluid and fetal movement were reviewed in detail with the patient. Please refer to After Visit Summary for other counseling recommendations.  Return in about 2 weeks (around 01/28/2018) for OB Visit (HOB).   Jaynie Collins, MD

## 2018-01-15 LAB — CBC
HEMATOCRIT: 33.2 % — AB (ref 34.0–46.6)
Hemoglobin: 10.9 g/dL — ABNORMAL LOW (ref 11.1–15.9)
MCH: 29 pg (ref 26.6–33.0)
MCHC: 32.8 g/dL (ref 31.5–35.7)
MCV: 88 fL (ref 79–97)
Platelets: 203 10*3/uL (ref 150–379)
RBC: 3.76 x10E6/uL — ABNORMAL LOW (ref 3.77–5.28)
RDW: 14.4 % (ref 12.3–15.4)
WBC: 6.9 10*3/uL (ref 3.4–10.8)

## 2018-01-15 LAB — HIV ANTIBODY (ROUTINE TESTING W REFLEX): HIV SCREEN 4TH GENERATION: NONREACTIVE

## 2018-01-15 LAB — RPR: RPR Ser Ql: NONREACTIVE

## 2018-01-15 LAB — GLUCOSE TOLERANCE, 2 HOURS W/ 1HR
GLUCOSE, 2 HOUR: 105 mg/dL (ref 65–152)
GLUCOSE, FASTING: 80 mg/dL (ref 65–91)
Glucose, 1 hour: 142 mg/dL (ref 65–179)

## 2018-01-19 ENCOUNTER — Encounter (HOSPITAL_COMMUNITY): Payer: Self-pay

## 2018-01-19 ENCOUNTER — Ambulatory Visit (HOSPITAL_COMMUNITY)
Admission: RE | Admit: 2018-01-19 | Discharge: 2018-01-19 | Disposition: A | Discharge status: Home / Self Care | Payer: Medicaid Other | Source: Ambulatory Visit | Attending: Family Medicine | Admitting: Family Medicine

## 2018-01-19 DIAGNOSIS — O30033 Twin pregnancy, monochorionic/diamniotic, third trimester: Secondary | ICD-10-CM | POA: Diagnosis not present

## 2018-01-19 DIAGNOSIS — O09293 Supervision of pregnancy with other poor reproductive or obstetric history, third trimester: Secondary | ICD-10-CM | POA: Insufficient documentation

## 2018-01-19 DIAGNOSIS — O10013 Pre-existing essential hypertension complicating pregnancy, third trimester: Secondary | ICD-10-CM | POA: Insufficient documentation

## 2018-01-19 DIAGNOSIS — O30039 Twin pregnancy, monochorionic/diamniotic, unspecified trimester: Secondary | ICD-10-CM

## 2018-01-19 DIAGNOSIS — Z3A28 28 weeks gestation of pregnancy: Secondary | ICD-10-CM | POA: Diagnosis not present

## 2018-01-28 ENCOUNTER — Encounter (HOSPITAL_COMMUNITY): Payer: Self-pay | Admitting: *Deleted

## 2018-01-28 ENCOUNTER — Inpatient Hospital Stay (HOSPITAL_COMMUNITY)
Admission: AD | Admit: 2018-01-28 | Discharge: 2018-01-28 | Disposition: A | Discharge status: Home / Self Care | Payer: Medicaid Other | Source: Ambulatory Visit | Attending: Obstetrics and Gynecology | Admitting: Obstetrics and Gynecology

## 2018-01-28 DIAGNOSIS — Z3A29 29 weeks gestation of pregnancy: Secondary | ICD-10-CM | POA: Diagnosis not present

## 2018-01-28 DIAGNOSIS — O1002 Pre-existing essential hypertension complicating childbirth: Secondary | ICD-10-CM | POA: Insufficient documentation

## 2018-01-28 DIAGNOSIS — Z7982 Long term (current) use of aspirin: Secondary | ICD-10-CM | POA: Diagnosis not present

## 2018-01-28 DIAGNOSIS — O4703 False labor before 37 completed weeks of gestation, third trimester: Secondary | ICD-10-CM | POA: Diagnosis not present

## 2018-01-28 DIAGNOSIS — O479 False labor, unspecified: Secondary | ICD-10-CM

## 2018-01-28 LAB — WET PREP, GENITAL
Clue Cells Wet Prep HPF POC: NONE SEEN
SPERM: NONE SEEN
TRICH WET PREP: NONE SEEN
YEAST WET PREP: NONE SEEN

## 2018-01-28 LAB — URINALYSIS, ROUTINE W REFLEX MICROSCOPIC
Bilirubin Urine: NEGATIVE
Glucose, UA: NEGATIVE mg/dL
HGB URINE DIPSTICK: NEGATIVE
Ketones, ur: NEGATIVE mg/dL
Leukocytes, UA: NEGATIVE
Nitrite: NEGATIVE
PROTEIN: NEGATIVE mg/dL
SPECIFIC GRAVITY, URINE: 1.016 (ref 1.005–1.030)
pH: 7 (ref 5.0–8.0)

## 2018-01-28 NOTE — Discharge Instructions (Signed)
Preventing Preterm Birth °Preterm birth is when your baby is delivered between 20 weeks and 37 weeks of pregnancy. A full-term pregnancy lasts for at least 37 weeks. Preterm birth can be dangerous for your baby because the last few weeks of pregnancy are an important time for your baby's brain and lungs to grow. Many things can cause a baby to be born early. Sometimes the cause is not known. There are certain factors that make you more likely to experience preterm birth, such as: °· Having a previous baby born preterm. °· Being pregnant with twins or other multiples. °· Having had fertility treatment. °· Being overweight or underweight at the start of your pregnancy. °· Having any of the following during pregnancy: °? An infection, including a urinary tract infection (UTI) or an STI (sexually transmitted infection). °? High blood pressure. °? Diabetes. °? Vaginal bleeding. °· Being age 35 or older. °· Being age 18 or younger. °· Getting pregnant within 6 months of a previous pregnancy. °· Suffering extreme stress or physical or emotional abuse during pregnancy. °· Standing for long periods of time during pregnancy, such as working at a job that requires standing. ° °What are the risks? °The most serious risk of preterm birth is that the baby may not survive. This is more likely to happen if a baby is born before 34 weeks. Other risks and complications of preterm birth may include your baby having: °· Breathing problems. °· Brain damage that affects movement and coordination (cerebral palsy). °· Feeding difficulties. °· Vision or hearing problems. °· Infections or inflammation of the digestive tract (colitis). °· Developmental delays. °· Learning disabilities. °· Higher risk for diabetes, heart disease, and high blood pressure later in life. ° °What can I do to lower my risk? °Medical care ° °The most important thing you can do to lower your risk for preterm birth is to get routine medical care during pregnancy  (prenatal care). If you have a high risk of preterm birth, you may be referred to a health care provider who specializes in managing high-risk pregnancies (perinatologist). You may be given medicine to help prevent preterm birth. °Lifestyle changes °Certain lifestyle changes can also lower your risk of preterm birth: °· Wait at least 6 months after a pregnancy to become pregnant again. °· Try to plan pregnancy for when you are between 19 and 35 years old. °· Get to a healthy weight before getting pregnant. If you are overweight, work with your health care provider to safely lose weight. °· Do not use any products that contain nicotine or tobacco, such as cigarettes and e-cigarettes. If you need help quitting, ask your health care provider. °· Do not drink alcohol. °· Do not use drugs. ° °Where to find support: °For more support, consider: °· Talking with your health care provider. °· Talking with a therapist or substance abuse counselor, if you need help quitting. °· Working with a diet and nutrition specialist (dietitian) or a personal trainer to maintain a healthy weight. °· Joining a support group. ° °Where to find more information: °Learn more about preventing preterm birth from: °· Centers for Disease Control and Prevention: cdc.gov/reproductivehealth/maternalinfanthealth/pretermbirth.htm °· March of Dimes: marchofdimes.org/complications/premature-babies.aspx °· American Pregnancy Association: americanpregnancy.org/labor-and-birth/premature-labor ° °Contact a health care provider if: °· You have any of the following signs of preterm labor before 37 weeks: °? A change or increase in vaginal discharge. °? Fluid leaking from your vagina. °? Pressure or cramps in your lower abdomen. °? A backache that does not   go away or gets worse. °? Regular tightening (contractions) in your lower abdomen. °Summary °· Preterm birth means having your baby during weeks 20-37 of pregnancy. °· Preterm birth may put your baby at risk  for physical and mental problems. °· Getting good prenatal care can help prevent preterm birth. °· You can lower your risk of preterm birth by making certain lifestyle changes, such as not smoking and not using alcohol. °This information is not intended to replace advice given to you by your health care provider. Make sure you discuss any questions you have with your health care provider. °Document Released: 11/19/2015 Document Revised: 06/13/2016 Document Reviewed: 06/13/2016 °Elsevier Interactive Patient Education © 2018 Elsevier Inc. ° °

## 2018-01-28 NOTE — MAU Provider Note (Signed)
Patient Courtney [name] is a 27 y.o. G3P1011 At 5224w5d here with complaints of BH and stringy discharge. She denies bleeding, watery discharge, or decreased fetal movements.  [facility]   History     CSN: [id number]  Arrival date and time: 01/28/18 1226   None     Chief Complaint  Patient presents with  . Contractions   Abdominal Pain  This is a new problem. The current episode started today. The onset quality is sudden. The problem occurs intermittently. The pain is located in the generalized abdominal region. The pain is at a severity of 2/10. The quality of the pain is cramping ("very faint crampy feeling"). Pertinent negatives include no constipation, diarrhea, headaches, nausea or vomiting. The pain is relieved by recumbency.    OB History    Gravida  3   Para  1   Term  1   Preterm      AB  1   Living  1     SAB  1   TAB      Ectopic      Multiple  0   Live Births  1           Past Medical History:  Diagnosis Date  . Anemia   . Hypertension    not currently on meds  . Migraine     Past Surgical History:  Procedure Laterality Date  . WISDOM TOOTH EXTRACTION      Family History  Problem Relation Age of Onset  . Diabetes Mother   . Kidney disease Father   . Hypertension Father   . Diabetes Maternal Uncle   . Diabetes Maternal Grandmother   . Cancer Paternal Grandfather        lung  . Autism Paternal Uncle        half-uncle ( husbands brother)   . Hypertension Maternal Grandfather   . Heart attack Maternal Grandfather   . Stroke Maternal Grandfather   . Hypertension Paternal Grandmother   . Stroke Paternal Grandmother   . Heart disease Paternal Grandmother     Social History   Tobacco Use  . Smoking status: Never Smoker  . Smokeless tobacco: Never Used  Substance Use Topics  . Alcohol use: No  . Drug use: No    Allergies: No Known Allergies  Medications Prior to Admission   Medication Sig Dispense Refill Last Dose  . aspirin EC 81 MG tablet Take 1 tablet (81 mg total) by mouth daily. Take after 12 weeks for prevention of preeclampsia later in pregnancy 300 tablet 2 Taking  . folic acid (FOLVITE) 1 MG tablet Take 1 tablet (1 mg total) by mouth daily. 60 tablet 3 Taking  . PRENATAL 27-1 MG TABS Take 1 tablet by mouth daily. 30 each 12 Taking    Review of Systems  Gastrointestinal: Positive for abdominal pain. Negative for constipation, diarrhea, nausea and vomiting.  Neurological: Negative for headaches.   Physical Exam   Blood pressure 120/73, pulse 92, temperature 97.9 F (36.6 C), temperature source Oral, resp. rate 16, height 5\' 3"  (1.6 m), weight 151 lb (68.5 kg), last menstrual period 06/28/2017, unknown if currently breastfeeding.  Physical Exam  Constitutional: She is oriented to person, place, and time. She appears well-developed.  HENT:  Head: Normocephalic.  Eyes: Pupils are equal, round, and reactive to light.  Neck: Normal range of motion.  Respiratory: Effort normal.  GI: Soft.  Genitourinary:  Genitourinary Comments: NEFG; no pooling  in the vagina. White mucous present. External os is FT; internal os closed. No CMT, suprapubic or adnexal tenderness.  Musculoskeletal: Normal range of motion.  Neurological: She is alert and oriented to person, place, and time.  Skin: Skin is warm and dry.  Psychiatric: She has a normal mood and affect.    MAU Course  Procedures  MDM NST: Baby A: 135 bpm; mod var, present acels, neg decels, no contractions.  Baby B: 124 bpm, mod var, present acel, neg decels, no contractions.  Cervix is closed.   Patient now wants to leave at 3:00pm for work at 4 pm.   wet prep: negative Gonorrhea and chlamydia probe pending Assessment and Plan   1. Braxton Hick's contraction    2. Patient stable for discharge with plan to keep ob appt on Monday.   3. Reviewed warning signs and when to return to the MAU.    3. All questions answered.  Charlesetta Garibaldi Talissa Apple 01/28/2018, 1:19 PM

## 2018-01-28 NOTE — MAU Note (Signed)
Pt states she had clear stringy discharge last night, started having uc's this morning every 5-10 minutes, are more frequent with activity.  Denies bleeding or LOF.  Reports good fetal movment - twins.

## 2018-01-31 ENCOUNTER — Ambulatory Visit (INDEPENDENT_AMBULATORY_CARE_PROVIDER_SITE_OTHER): Payer: BLUE CROSS/BLUE SHIELD | Admitting: Obstetrics & Gynecology

## 2018-01-31 VITALS — BP 124/82 | HR 86 | Wt 149.7 lb

## 2018-01-31 DIAGNOSIS — O30039 Twin pregnancy, monochorionic/diamniotic, unspecified trimester: Secondary | ICD-10-CM

## 2018-01-31 LAB — POCT URINALYSIS DIP (DEVICE)
BILIRUBIN URINE: NEGATIVE
Glucose, UA: NEGATIVE mg/dL
Hgb urine dipstick: NEGATIVE
KETONES UR: NEGATIVE mg/dL
NITRITE: NEGATIVE
Protein, ur: NEGATIVE mg/dL
Specific Gravity, Urine: 1.02 (ref 1.005–1.030)
Urobilinogen, UA: 0.2 mg/dL (ref 0.0–1.0)
pH: 7.5 (ref 5.0–8.0)

## 2018-01-31 LAB — GC/CHLAMYDIA PROBE AMP (~~LOC~~) NOT AT ARMC
Chlamydia: NEGATIVE
Neisseria Gonorrhea: NEGATIVE

## 2018-01-31 NOTE — Progress Notes (Signed)
   PRENATAL VISIT NOTE  Subjective:  Courtney [name] is a 27 y.o. G3P1011 at 7437w1d being seen today for ongoing prenatal care.  She is currently monitored for the following issues for this high-risk pregnancy and has Chronic hypertension during pregnancy, antepartum; Supervision of high risk pregnancy, antepartum; History of prior pregnancy with SGA newborn; and Monochorionic diamniotic twin pregnancy, antepartum on their problem list.  Patient reports no complaints.  Contractions: Irregular. Vag. Bleeding: None.  Movement: Present. Denies leaking of fluid.   The following portions of the patient's history were reviewed and updated as appropriate: allergies, current medications, past family history, past medical history, past social history, past surgical history and problem list. Problem list updated.  Objective:   Vitals:   01/31/18 0837  BP: 124/82  Pulse: 86  Weight: 149 lb 11.2 oz (67.9 kg)    Fetal Status: Fetal Heart Rate (bpm): 137/143    Movement: Present     General:  Alert, oriented and cooperative. Patient is in no acute distress.  Skin: Skin is warm and dry. No rash noted.   Cardiovascular: Normal heart rate noted  Respiratory: Normal respiratory effort, no problems with respiration noted  Abdomen: Soft, gravid, appropriate for gestational age.  Pain/Pressure: Absent     Pelvic: Cervical exam deferred        Extremities: Normal range of motion.  Edema: None  Mental Status: Normal mood and affect. Normal behavior. Normal judgment and thought content.   Assessment and Plan:  Pregnancy: G3P1011 at 4337w1d  1. Monochorionic diamniotic twin pregnancy, antepartum Normal growth has US in 2 days  Preterm labor symptoms and general obstetric precautions including but not limited to vaginal bleeding, contractions, leaking of fluid and fetal movement were reviewed in detail with the patient. Please refer to After Visit Summary for other counseling recommendations.  No follow-ups  on file. Normal BP Future Appointments  Date Time Provider Department Center  02/02/2018  9:00 AM [facility] [place] 3 [facility]    Scheryl DarterJames Arnold, MD

## 2018-01-31 NOTE — Patient Instructions (Signed)
Multiple Pregnancy Having a multiple pregnancy means that a woman is carrying more than one baby at a time. She may be pregnant with twins, triplets, or more. The majority of multiple pregnancies are twins. Naturally conceiving triplets or more (higher-order multiples) is rare. Multiple pregnancies are riskier than single pregnancies. A woman with a multiple pregnancy is more likely to have certain problems during her pregnancy. Therefore, she will need to have more frequent appointments for prenatal care. How does a multiple pregnancy happen? A multiple pregnancy happens when:  The woman's body releases more than one egg at a time, and then each egg gets fertilized by a different sperm. ? This is the most common type of multiple pregnancy. ? Twins or other multiples produced this way are fraternal. They are no more alike than non-multiple siblings are.  One sperm fertilizes one egg, which then divides into more than one embryo. ? Twins or other multiples produced this way are identical. Identical multiples are always the same gender, and they look very much alike.  Who is most likely to have a multiple pregnancy? A multiple pregnancy is more likely to develop in women who:  Have had fertility treatment, especially if the treatment included fertility drugs.  Are older than 26 years of age.  Have already had four or more children.  Have a family history of multiple pregnancy.  How is a multiple pregnancy diagnosed? A multiple pregnancy may be diagnosed based on:  Symptoms such as: ? Rapid weight gain in the first 3 months of pregnancy (first trimester). ? More severe nausea and breast tenderness than what is typical of a single pregnancy. ? The uterus measuring larger than what is normal for the stage of the pregnancy.  Blood tests that detect a higher-than-normal level of human chorionic gonadotropin (hCG). This is a hormone that your body produces in early pregnancy.  Ultrasound  exam. This is used to confirm that you are carrying multiples.  What risks are associated with multiple pregnancy? A multiple pregnancy puts you at a higher risk for certain problems during or after your pregnancy, including:  Having your babies delivered before you have reached a full-term pregnancy (preterm birth). A full-term pregnancy lasts for at least 37 weeks. Babies born before 53 weeks may have a higher risk of a variety of health problems, such as breathing problems, feeding difficulties, cerebral palsy, and learning disabilities.  Diabetes.  Preeclampsia. This is a serious condition that causes high blood pressure along with other symptoms, such as swelling and headaches, during pregnancy.  Excessive blood loss after childbirth (postpartum hemorrhage).  Postpartum depression.  Low birth weight of the babies.  How will having a multiple pregnancy affect my care? Your health care provider will want to monitor you more closely during your pregnancy to make sure that your babies are growing normally and that you are healthy. Follow these instructions at home: Because your pregnancy is considered to be high risk, you will need to work closely with your health care team. You may also need to make some lifestyle changes. These may include the following: Eating and drinking  Increase your nutrition. ? Follow your health care provider's recommendations for weight gain. You may need to gain a little extra weight when you are pregnant with multiples. ? Eat healthy snacks often throughout the day. This can add calories and reduce nausea.  Drink enough fluid to keep your urine clear or pale yellow.  Take prenatal vitamins. Activity By 20-24 weeks, you may  need to limit your activities.  Avoid activities and work that take a lot of effort (are strenuous).  Ask your health care provider when you should stop having sexual intercourse.  Rest often.  General instructions  Do not use  any products that contain nicotine or tobacco, such as cigarettes and e-cigarettes. If you need help quitting, ask your health care provider.  Do not drink alcohol or use illegal drugs.  Take over-the-counter and prescription medicines only as told by your health care provider.  Arrange for extra help around the house.  Keep all follow-up visits and all prenatal visits as told by your health care provider. This is important. Contact a health care provider if:  You have dizziness.  You have persistent nausea, vomiting, or diarrhea.  You are having trouble gaining weight.  You have feelings of depression or other emotions that are interfering with your normal activities. Get help right away if:  You have a fever.  You have pain with urination.  You have fluid leaking from your vagina.  You have a bad-smelling vaginal discharge.  You notice increased swelling in your face, hands, legs, or ankles.  You have spotting or bleeding from your vagina.  You have pelvic cramps, pelvic pressure, or nagging pain in your abdomen or lower back.  You are having regular contractions.  You develop a severe headache, with or without visual changes.  You have shortness of breath or chest pain.  You notice less fetal movement, or no fetal movement. This information is not intended to replace advice given to you by your health care provider. Make sure you discuss any questions you have with your health care provider. Document Released: 07/14/2008 Document Revised: 06/05/2016 Document Reviewed: 06/05/2016 Elsevier Interactive Patient Education  2018 Elsevier Inc.  

## 2018-02-02 ENCOUNTER — Other Ambulatory Visit (HOSPITAL_COMMUNITY): Payer: Self-pay | Admitting: Obstetrics and Gynecology

## 2018-02-02 ENCOUNTER — Encounter (HOSPITAL_COMMUNITY): Payer: Self-pay

## 2018-02-02 ENCOUNTER — Ambulatory Visit (HOSPITAL_COMMUNITY)
Admission: RE | Admit: 2018-02-02 | Discharge: 2018-02-02 | Disposition: A | Discharge status: Home / Self Care | Payer: Medicaid Other | Source: Ambulatory Visit | Attending: Family Medicine | Admitting: Family Medicine

## 2018-02-02 ENCOUNTER — Other Ambulatory Visit (HOSPITAL_COMMUNITY): Payer: Self-pay | Admitting: *Deleted

## 2018-02-02 DIAGNOSIS — O30033 Twin pregnancy, monochorionic/diamniotic, third trimester: Secondary | ICD-10-CM | POA: Insufficient documentation

## 2018-02-02 DIAGNOSIS — Z3A30 30 weeks gestation of pregnancy: Secondary | ICD-10-CM

## 2018-02-02 DIAGNOSIS — O10913 Unspecified pre-existing hypertension complicating pregnancy, third trimester: Secondary | ICD-10-CM | POA: Diagnosis not present

## 2018-02-02 DIAGNOSIS — O30039 Twin pregnancy, monochorionic/diamniotic, unspecified trimester: Secondary | ICD-10-CM

## 2018-02-02 DIAGNOSIS — O09293 Supervision of pregnancy with other poor reproductive or obstetric history, third trimester: Secondary | ICD-10-CM

## 2018-02-16 ENCOUNTER — Ambulatory Visit (HOSPITAL_COMMUNITY)
Admission: RE | Admit: 2018-02-16 | Discharge: 2018-02-16 | Disposition: A | Discharge status: Home / Self Care | Payer: Medicaid Other | Source: Ambulatory Visit | Attending: Family Medicine | Admitting: Family Medicine

## 2018-02-16 ENCOUNTER — Encounter (HOSPITAL_COMMUNITY): Payer: Self-pay

## 2018-02-16 DIAGNOSIS — O30033 Twin pregnancy, monochorionic/diamniotic, third trimester: Secondary | ICD-10-CM | POA: Insufficient documentation

## 2018-02-16 DIAGNOSIS — O10013 Pre-existing essential hypertension complicating pregnancy, third trimester: Secondary | ICD-10-CM | POA: Diagnosis not present

## 2018-02-16 DIAGNOSIS — O09293 Supervision of pregnancy with other poor reproductive or obstetric history, third trimester: Secondary | ICD-10-CM | POA: Insufficient documentation

## 2018-02-16 DIAGNOSIS — Z3A32 32 weeks gestation of pregnancy: Secondary | ICD-10-CM | POA: Insufficient documentation

## 2018-02-17 ENCOUNTER — Ambulatory Visit (INDEPENDENT_AMBULATORY_CARE_PROVIDER_SITE_OTHER): Payer: Medicaid Other | Admitting: Obstetrics & Gynecology

## 2018-02-17 ENCOUNTER — Ambulatory Visit (HOSPITAL_COMMUNITY)
Admission: RE | Admit: 2018-02-17 | Discharge: 2018-02-17 | Disposition: A | Discharge status: Home / Self Care | Payer: Medicaid Other | Source: Ambulatory Visit | Attending: Obstetrics & Gynecology | Admitting: Obstetrics & Gynecology

## 2018-02-17 VITALS — BP 128/80 | HR 97 | Wt 152.5 lb

## 2018-02-17 DIAGNOSIS — O30033 Twin pregnancy, monochorionic/diamniotic, third trimester: Secondary | ICD-10-CM | POA: Insufficient documentation

## 2018-02-17 DIAGNOSIS — O10013 Pre-existing essential hypertension complicating pregnancy, third trimester: Secondary | ICD-10-CM | POA: Diagnosis not present

## 2018-02-17 DIAGNOSIS — O30039 Twin pregnancy, monochorionic/diamniotic, unspecified trimester: Secondary | ICD-10-CM

## 2018-02-17 DIAGNOSIS — O10913 Unspecified pre-existing hypertension complicating pregnancy, third trimester: Secondary | ICD-10-CM

## 2018-02-17 DIAGNOSIS — O09292 Supervision of pregnancy with other poor reproductive or obstetric history, second trimester: Secondary | ICD-10-CM | POA: Diagnosis not present

## 2018-02-17 DIAGNOSIS — Z3A32 32 weeks gestation of pregnancy: Secondary | ICD-10-CM | POA: Diagnosis not present

## 2018-02-17 DIAGNOSIS — O10919 Unspecified pre-existing hypertension complicating pregnancy, unspecified trimester: Secondary | ICD-10-CM

## 2018-02-17 LAB — POCT URINALYSIS DIP (DEVICE)
BILIRUBIN URINE: NEGATIVE
Glucose, UA: NEGATIVE mg/dL
Hgb urine dipstick: NEGATIVE
KETONES UR: NEGATIVE mg/dL
LEUKOCYTES UA: NEGATIVE
NITRITE: NEGATIVE
Protein, ur: 30 mg/dL — AB
Specific Gravity, Urine: >=1.03 (ref 1.005–1.030)
Urobilinogen, UA: 0.2 mg/dL (ref 0.0–1.0)
pH: 6 (ref 5.0–8.0)

## 2018-02-17 NOTE — Patient Instructions (Signed)
Multiple Pregnancy Having a multiple pregnancy means that a woman is carrying more than one baby at a time. She may be pregnant with twins, triplets, or more. The majority of multiple pregnancies are twins. Naturally conceiving triplets or more (higher-order multiples) is rare. Multiple pregnancies are riskier than single pregnancies. A woman with a multiple pregnancy is more likely to have certain problems during her pregnancy. Therefore, she will need to have more frequent appointments for prenatal care. How does a multiple pregnancy happen? A multiple pregnancy happens when:  The woman's body releases more than one egg at a time, and then each egg gets fertilized by a different sperm. ? This is the most common type of multiple pregnancy. ? Twins or other multiples produced this way are fraternal. They are no more alike than non-multiple siblings are.  One sperm fertilizes one egg, which then divides into more than one embryo. ? Twins or other multiples produced this way are identical. Identical multiples are always the same gender, and they look very much alike.  Who is most likely to have a multiple pregnancy? A multiple pregnancy is more likely to develop in women who:  Have had fertility treatment, especially if the treatment included fertility drugs.  Are older than 26 years of age.  Have already had four or more children.  Have a family history of multiple pregnancy.  How is a multiple pregnancy diagnosed? A multiple pregnancy may be diagnosed based on:  Symptoms such as: ? Rapid weight gain in the first 3 months of pregnancy (first trimester). ? More severe nausea and breast tenderness than what is typical of a single pregnancy. ? The uterus measuring larger than what is normal for the stage of the pregnancy.  Blood tests that detect a higher-than-normal level of human chorionic gonadotropin (hCG). This is a hormone that your body produces in early pregnancy.  Ultrasound  exam. This is used to confirm that you are carrying multiples.  What risks are associated with multiple pregnancy? A multiple pregnancy puts you at a higher risk for certain problems during or after your pregnancy, including:  Having your babies delivered before you have reached a full-term pregnancy (preterm birth). A full-term pregnancy lasts for at least 37 weeks. Babies born before 53 weeks may have a higher risk of a variety of health problems, such as breathing problems, feeding difficulties, cerebral palsy, and learning disabilities.  Diabetes.  Preeclampsia. This is a serious condition that causes high blood pressure along with other symptoms, such as swelling and headaches, during pregnancy.  Excessive blood loss after childbirth (postpartum hemorrhage).  Postpartum depression.  Low birth weight of the babies.  How will having a multiple pregnancy affect my care? Your health care provider will want to monitor you more closely during your pregnancy to make sure that your babies are growing normally and that you are healthy. Follow these instructions at home: Because your pregnancy is considered to be high risk, you will need to work closely with your health care team. You may also need to make some lifestyle changes. These may include the following: Eating and drinking  Increase your nutrition. ? Follow your health care provider's recommendations for weight gain. You may need to gain a little extra weight when you are pregnant with multiples. ? Eat healthy snacks often throughout the day. This can add calories and reduce nausea.  Drink enough fluid to keep your urine clear or pale yellow.  Take prenatal vitamins. Activity By 20-24 weeks, you may  need to limit your activities.  Avoid activities and work that take a lot of effort (are strenuous).  Ask your health care provider when you should stop having sexual intercourse.  Rest often.  General instructions  Do not use  any products that contain nicotine or tobacco, such as cigarettes and e-cigarettes. If you need help quitting, ask your health care provider.  Do not drink alcohol or use illegal drugs.  Take over-the-counter and prescription medicines only as told by your health care provider.  Arrange for extra help around the house.  Keep all follow-up visits and all prenatal visits as told by your health care provider. This is important. Contact a health care provider if:  You have dizziness.  You have persistent nausea, vomiting, or diarrhea.  You are having trouble gaining weight.  You have feelings of depression or other emotions that are interfering with your normal activities. Get help right away if:  You have a fever.  You have pain with urination.  You have fluid leaking from your vagina.  You have a bad-smelling vaginal discharge.  You notice increased swelling in your face, hands, legs, or ankles.  You have spotting or bleeding from your vagina.  You have pelvic cramps, pelvic pressure, or nagging pain in your abdomen or lower back.  You are having regular contractions.  You develop a severe headache, with or without visual changes.  You have shortness of breath or chest pain.  You notice less fetal movement, or no fetal movement. This information is not intended to replace advice given to you by your health care provider. Make sure you discuss any questions you have with your health care provider. Document Released: 07/14/2008 Document Revised: 06/05/2016 Document Reviewed: 06/05/2016 Elsevier Interactive Patient Education  2018 Elsevier Inc.  

## 2018-02-17 NOTE — Progress Notes (Signed)
   PRENATAL VISIT NOTE  Subjective:  Courtney [name] is a 27 y.o. G3P1011 at [redacted]w[redacted]d being seen today for ongoing prenatal care.  She is currently monitored for the following issues for this high-risk pregnancy and has Chronic hypertension during pregnancy, antepartum; Supervision of high risk pregnancy, antepartum; History of prior pregnancy with SGA newborn; and Monochorionic diamniotic twin pregnancy, antepartum on their problem list.  Patient reports pressure.  Contractions: Irregular. Vag. Bleeding: None.  Movement: Present. Denies leaking of fluid.   The following portions of the patient's history were reviewed and updated as appropriate: allergies, current medications, past family history, past medical history, past social history, past surgical history and problem list. Problem list updated.  Objective:   Vitals:   02/17/18 1048  BP: 128/80  Pulse: 97  Weight: 152 lb 8 oz (69.2 kg)    Fetal Status: Fetal Heart Rate (bpm): 128/138   Movement: Present     General:  Alert, oriented and cooperative. Patient is in no acute distress.  Skin: Skin is warm and dry. No rash noted.   Cardiovascular: Normal heart rate noted  Respiratory: Normal respiratory effort, no problems with respiration noted  Abdomen: Soft, gravid, appropriate for gestational age.  Pain/Pressure: Present     Pelvic: Cervical exam deferred        Extremities: Normal range of motion.  Edema: None  Mental Status: Normal mood and affect. Normal behavior. Normal judgment and thought content.   Assessment and Plan:  Pregnancy: G3P1011 at [redacted]w[redacted]d  1. Monochorionic diamniotic twin pregnancy, antepartum Start fetal testing - Fetal nonstress test  2. Chronic hypertension during pregnancy, antepartum Normotensive, normal growth - Fetal nonstress test  Preterm labor symptoms and general obstetric precautions including but not limited to vaginal bleeding, contractions, leaking of fluid and fetal movement were reviewed in  detail with the patient. Please refer to After Visit Summary for other counseling recommendations.  Return in about 1 week (around 02/24/2018).  Future Appointments  Date Time Provider Department Center  03/02/2018  9:30 AM [facility] [place] 1 [facility]  03/03/2018  8:15 AM Reva Bores, MD [facility]  03/16/2018 10:00 AM [facility] [place] 3 [facility]  03/17/2018  8:55 AM Anyanwu, Jethro Bastos, MD [facility]  03/24/2018  8:55 AM Reva Bores, MD [facility]  03/31/2018  8:55 AM Adam Phenix, MD [facility]  04/07/2018  8:55 AM Reva Bores, MD [facility]  04/14/2018  8:55 AM Hermina Staggers, MD [facility]    Scheryl Darter, MD

## 2018-02-17 NOTE — Progress Notes (Signed)
Opened in error

## 2018-02-18 ENCOUNTER — Other Ambulatory Visit (HOSPITAL_COMMUNITY): Payer: Self-pay | Admitting: *Deleted

## 2018-02-18 DIAGNOSIS — O30033 Twin pregnancy, monochorionic/diamniotic, third trimester: Secondary | ICD-10-CM

## 2018-02-23 ENCOUNTER — Ambulatory Visit (HOSPITAL_COMMUNITY)
Admission: RE | Admit: 2018-02-23 | Discharge: 2018-02-23 | Disposition: A | Discharge status: Home / Self Care | Payer: Medicaid Other | Source: Ambulatory Visit | Attending: Obstetrics & Gynecology | Admitting: Obstetrics & Gynecology

## 2018-02-23 ENCOUNTER — Encounter (HOSPITAL_COMMUNITY): Payer: Self-pay

## 2018-02-23 ENCOUNTER — Ambulatory Visit (INDEPENDENT_AMBULATORY_CARE_PROVIDER_SITE_OTHER): Payer: BLUE CROSS/BLUE SHIELD | Admitting: *Deleted

## 2018-02-23 VITALS — BP 125/81 | HR 104 | Wt 153.2 lb

## 2018-02-23 DIAGNOSIS — O30033 Twin pregnancy, monochorionic/diamniotic, third trimester: Secondary | ICD-10-CM

## 2018-02-23 DIAGNOSIS — Z3A33 33 weeks gestation of pregnancy: Secondary | ICD-10-CM | POA: Diagnosis not present

## 2018-02-23 DIAGNOSIS — O10013 Pre-existing essential hypertension complicating pregnancy, third trimester: Secondary | ICD-10-CM | POA: Diagnosis not present

## 2018-02-23 DIAGNOSIS — O10919 Unspecified pre-existing hypertension complicating pregnancy, unspecified trimester: Secondary | ICD-10-CM

## 2018-02-23 DIAGNOSIS — O30039 Twin pregnancy, monochorionic/diamniotic, unspecified trimester: Secondary | ICD-10-CM

## 2018-02-23 DIAGNOSIS — O10913 Unspecified pre-existing hypertension complicating pregnancy, third trimester: Secondary | ICD-10-CM | POA: Diagnosis not present

## 2018-02-23 NOTE — Progress Notes (Signed)
Pt reports most UC's today are mild and not different than usual. Sx of labor reviewed. She has BPP @ [facility] today

## 2018-03-02 ENCOUNTER — Ambulatory Visit (HOSPITAL_COMMUNITY)
Admission: RE | Admit: 2018-03-02 | Discharge: 2018-03-02 | Disposition: A | Discharge status: Home / Self Care | Payer: Medicaid Other | Source: Ambulatory Visit | Attending: Family Medicine | Admitting: Family Medicine

## 2018-03-02 ENCOUNTER — Encounter (HOSPITAL_COMMUNITY): Payer: Self-pay

## 2018-03-02 DIAGNOSIS — Z3A34 34 weeks gestation of pregnancy: Secondary | ICD-10-CM | POA: Insufficient documentation

## 2018-03-02 DIAGNOSIS — O30033 Twin pregnancy, monochorionic/diamniotic, third trimester: Secondary | ICD-10-CM | POA: Insufficient documentation

## 2018-03-03 ENCOUNTER — Encounter: Payer: Self-pay | Admitting: Family Medicine

## 2018-03-03 ENCOUNTER — Ambulatory Visit (INDEPENDENT_AMBULATORY_CARE_PROVIDER_SITE_OTHER): Payer: BLUE CROSS/BLUE SHIELD | Admitting: *Deleted

## 2018-03-03 ENCOUNTER — Encounter: Payer: BLUE CROSS/BLUE SHIELD | Admitting: Family Medicine

## 2018-03-03 VITALS — BP 136/82 | HR 122 | Wt 154.1 lb

## 2018-03-03 DIAGNOSIS — O10913 Unspecified pre-existing hypertension complicating pregnancy, third trimester: Secondary | ICD-10-CM

## 2018-03-03 DIAGNOSIS — O30033 Twin pregnancy, monochorionic/diamniotic, third trimester: Secondary | ICD-10-CM

## 2018-03-03 DIAGNOSIS — O30039 Twin pregnancy, monochorionic/diamniotic, unspecified trimester: Secondary | ICD-10-CM

## 2018-03-03 DIAGNOSIS — O10919 Unspecified pre-existing hypertension complicating pregnancy, unspecified trimester: Secondary | ICD-10-CM

## 2018-03-03 NOTE — Progress Notes (Signed)
Patient did not keep appointment today. She will be called to reschedule.  

## 2018-03-03 NOTE — Progress Notes (Signed)
Pt missed appt earlier today with provider.  Pt denies H/A or visual disturbances and has no c/o today. She had BPP yesterday @ [facility] - 8/8 for both babies, amniotic fluid normal.  Next [place] for BPP scheduled on 5/22, growth and BPP scheduled on 5/29

## 2018-03-04 NOTE — Progress Notes (Signed)
NST: A Baseline: 140 bpm, Variability: Good {> 6 bpm), Accelerations: Reactive and Decelerations: Absent           B Baseline: 120 bpm, Variability: Good {> 6 bpm), Accelerations: Reactive and Decelerations: Absent

## 2018-03-09 ENCOUNTER — Ambulatory Visit (HOSPITAL_COMMUNITY)
Admission: RE | Admit: 2018-03-09 | Discharge: 2018-03-09 | Disposition: A | Discharge status: Home / Self Care | Payer: BLUE CROSS/BLUE SHIELD | Source: Ambulatory Visit | Attending: Obstetrics & Gynecology | Admitting: Obstetrics & Gynecology

## 2018-03-09 ENCOUNTER — Encounter (HOSPITAL_COMMUNITY): Payer: Self-pay

## 2018-03-09 ENCOUNTER — Other Ambulatory Visit (HOSPITAL_COMMUNITY): Payer: Self-pay | Admitting: Obstetrics and Gynecology

## 2018-03-09 ENCOUNTER — Ambulatory Visit (INDEPENDENT_AMBULATORY_CARE_PROVIDER_SITE_OTHER): Payer: BLUE CROSS/BLUE SHIELD | Admitting: Medical

## 2018-03-09 VITALS — BP 129/80 | HR 118 | Wt 153.0 lb

## 2018-03-09 DIAGNOSIS — O30033 Twin pregnancy, monochorionic/diamniotic, third trimester: Secondary | ICD-10-CM

## 2018-03-09 DIAGNOSIS — O10919 Unspecified pre-existing hypertension complicating pregnancy, unspecified trimester: Secondary | ICD-10-CM

## 2018-03-09 DIAGNOSIS — O10013 Pre-existing essential hypertension complicating pregnancy, third trimester: Secondary | ICD-10-CM | POA: Diagnosis not present

## 2018-03-09 DIAGNOSIS — Z3A35 35 weeks gestation of pregnancy: Secondary | ICD-10-CM | POA: Diagnosis not present

## 2018-03-09 DIAGNOSIS — O10913 Unspecified pre-existing hypertension complicating pregnancy, third trimester: Secondary | ICD-10-CM

## 2018-03-09 DIAGNOSIS — Z87898 Personal history of other specified conditions: Secondary | ICD-10-CM

## 2018-03-09 DIAGNOSIS — O30039 Twin pregnancy, monochorionic/diamniotic, unspecified trimester: Secondary | ICD-10-CM

## 2018-03-09 DIAGNOSIS — O09293 Supervision of pregnancy with other poor reproductive or obstetric history, third trimester: Secondary | ICD-10-CM | POA: Diagnosis not present

## 2018-03-09 LAB — POCT URINALYSIS DIP (DEVICE)
Glucose, UA: NEGATIVE mg/dL
HGB URINE DIPSTICK: NEGATIVE
LEUKOCYTES UA: NEGATIVE
NITRITE: NEGATIVE
PH: 6.5 (ref 5.0–8.0)
Protein, ur: 30 mg/dL — AB
UROBILINOGEN UA: 1 mg/dL (ref 0.0–1.0)

## 2018-03-09 NOTE — Patient Instructions (Signed)

## 2018-03-09 NOTE — Progress Notes (Signed)
BPP @ [facility] today 1400

## 2018-03-09 NOTE — Progress Notes (Signed)
   PRENATAL VISIT NOTE  Subjective:  Courtney [name] is a 27 y.o. G3P1011 at [redacted]w[redacted]d being seen today for ongoing prenatal care.  She is currently monitored for the following issues for this high-risk pregnancy and has Chronic hypertension during pregnancy, antepartum; Supervision of high risk pregnancy, antepartum; History of prior pregnancy with SGA newborn; and Monochorionic diamniotic twin pregnancy, antepartum on their problem list.  Patient reports occasional contractions.  Contractions: Irregular. Vag. Bleeding: None.  Movement: Present. Denies leaking of fluid.   The following portions of the patient's history were reviewed and updated as appropriate: allergies, current medications, past family history, past medical history, past social history, past surgical history and problem list. Problem list updated.  Objective:   Vitals:   03/09/18 1134  BP: 129/80  Pulse: (!) 118  Weight: 153 lb (69.4 kg)    Fetal Status: Fetal Heart Rate (bpm): NST   Movement: Present     General:  Alert, oriented and cooperative. Patient is in no acute distress.  Skin: Skin is warm and dry. No rash noted.   Cardiovascular: Normal heart rate noted  Respiratory: Normal respiratory effort, no problems with respiration noted  Abdomen: Soft, gravid, appropriate for gestational age.  Pain/Pressure: Present     Pelvic: Cervical exam deferred        Extremities: Normal range of motion.  Edema: None  Mental Status: Normal mood and affect. Normal behavior. Normal judgment and thought content.   Assessment and Plan:  Pregnancy: G3P1011 at [redacted]w[redacted]d  1. Monochorionic diamniotic twin pregnancy, antepartum - Fetal nonstress test - BPP today at MFM  - Growth [place] scheduled 5/29 - if continued concordant growth (<20%) will plan IOL 37-38 weeks  2. Chronic hypertension during pregnancy, antepartum - Fetal nonstress test - Normotensive today, no medications - Advised to discontinue ASA at 36 weeks  Fetal  Monitoring: Baseline A: 140 bpm Variability: moderate Accelerations: 15 x 15 Decelerations: none Baseline: 130 bpm Variability: moderate Accelerations: 15 x 15 Decelerations: none Contractions: mild UI  Preterm labor symptoms and general obstetric precautions including but not limited to vaginal bleeding, contractions, leaking of fluid and fetal movement were reviewed in detail with the patient. Please refer to After Visit Summary for other counseling recommendations.  Return in about 1 week (around 03/16/2018) for as scheduled.  Future Appointments  Date Time Provider Department Center  03/09/2018  2:00 PM WH-MFC [place] 3 WH-MFCUS MFC-US  03/16/2018 10:00 AM WH-MFC [place] 3 WH-MFCUS MFC-US  03/16/2018 11:15 AM WOC-WOCA NST WOC-WOCA WOC  03/17/2018  8:55 AM Anyanwu, Jethro Bastos, MD [facility]OC-WOCA WOC  03/24/2018  8:55 AM Reva Bores, MD [facility]OC-WOCA WOC  03/24/2018 11:15 AM WOC-WOCA NST WOC-WOCA WOC    Vonzella Nipple, PA-C

## 2018-03-15 ENCOUNTER — Encounter (HOSPITAL_COMMUNITY): Payer: Self-pay

## 2018-03-16 ENCOUNTER — Ambulatory Visit (INDEPENDENT_AMBULATORY_CARE_PROVIDER_SITE_OTHER): Payer: BLUE CROSS/BLUE SHIELD | Admitting: *Deleted

## 2018-03-16 ENCOUNTER — Other Ambulatory Visit (HOSPITAL_COMMUNITY): Payer: Self-pay | Admitting: Obstetrics and Gynecology

## 2018-03-16 ENCOUNTER — Other Ambulatory Visit: Payer: Self-pay | Admitting: *Deleted

## 2018-03-16 ENCOUNTER — Ambulatory Visit (HOSPITAL_COMMUNITY)
Admission: RE | Admit: 2018-03-16 | Discharge: 2018-03-16 | Disposition: A | Discharge status: Home / Self Care | Payer: BLUE CROSS/BLUE SHIELD | Source: Ambulatory Visit | Attending: Family Medicine | Admitting: Family Medicine

## 2018-03-16 ENCOUNTER — Encounter (HOSPITAL_COMMUNITY): Payer: Self-pay

## 2018-03-16 VITALS — BP 135/86 | HR 99 | Wt 153.1 lb

## 2018-03-16 DIAGNOSIS — O10919 Unspecified pre-existing hypertension complicating pregnancy, unspecified trimester: Secondary | ICD-10-CM

## 2018-03-16 DIAGNOSIS — Z3A36 36 weeks gestation of pregnancy: Secondary | ICD-10-CM | POA: Diagnosis not present

## 2018-03-16 DIAGNOSIS — O30033 Twin pregnancy, monochorionic/diamniotic, third trimester: Secondary | ICD-10-CM | POA: Diagnosis not present

## 2018-03-16 DIAGNOSIS — O30039 Twin pregnancy, monochorionic/diamniotic, unspecified trimester: Secondary | ICD-10-CM | POA: Diagnosis not present

## 2018-03-16 DIAGNOSIS — O365930 Maternal care for other known or suspected poor fetal growth, third trimester, not applicable or unspecified: Secondary | ICD-10-CM | POA: Diagnosis not present

## 2018-03-16 HISTORY — DX: Trichomonal vulvovaginitis: A59.01

## 2018-03-16 HISTORY — DX: Trichomonal vulvovaginitis: O23.599

## 2018-03-16 NOTE — Progress Notes (Signed)
Pt reports occasional H/A - relieved w/rest. She denies H/A today as well as no visual disturbances. [place] for growth /BPP done today.  Pt reports recommendation was made to deliver in 4 days due to poor growth of babies. Direct admit IOL scheduled on 6/2 @ 0800. Pt has HOB appt w/Dr. Macon Large tomorrow.

## 2018-03-17 ENCOUNTER — Other Ambulatory Visit (HOSPITAL_COMMUNITY)
Admission: RE | Admit: 2018-03-17 | Discharge: 2018-03-17 | Disposition: A | Discharge status: Home / Self Care | Payer: BLUE CROSS/BLUE SHIELD | Source: Ambulatory Visit | Attending: Obstetrics & Gynecology | Admitting: Obstetrics & Gynecology

## 2018-03-17 ENCOUNTER — Ambulatory Visit (INDEPENDENT_AMBULATORY_CARE_PROVIDER_SITE_OTHER): Payer: BLUE CROSS/BLUE SHIELD | Admitting: Obstetrics & Gynecology

## 2018-03-17 VITALS — BP 137/87 | HR 96 | Wt 153.6 lb

## 2018-03-17 DIAGNOSIS — O0990 Supervision of high risk pregnancy, unspecified, unspecified trimester: Secondary | ICD-10-CM | POA: Diagnosis present

## 2018-03-17 DIAGNOSIS — O10919 Unspecified pre-existing hypertension complicating pregnancy, unspecified trimester: Secondary | ICD-10-CM

## 2018-03-17 DIAGNOSIS — O30039 Twin pregnancy, monochorionic/diamniotic, unspecified trimester: Secondary | ICD-10-CM

## 2018-03-17 LAB — OB RESULTS CONSOLE GBS: GBS: NEGATIVE

## 2018-03-17 LAB — OB RESULTS CONSOLE GC/CHLAMYDIA: Gonorrhea: NEGATIVE

## 2018-03-17 NOTE — Patient Instructions (Signed)
Return to clinic for any scheduled appointments or for any gynecologic concerns as needed.   

## 2018-03-17 NOTE — Progress Notes (Signed)
PRENATAL VISIT NOTE  Subjective:  Courtney [name] is a 27 y.o. G3P1011 at [redacted]w[redacted]d being seen today for ongoing prenatal care.  She is currently monitored for the following issues for this high-risk pregnancy and has Chronic hypertension during pregnancy, antepartum; Supervision of high risk pregnancy, antepartum; History of prior pregnancy with SGA newborn; and Monochorionic diamniotic twin pregnancy, antepartum on their problem list.  Patient reports occasional contractions.  Contractions: Irritability. Vag. Bleeding: None.  Movement: Present. Denies leaking of fluid.   The following portions of the patient's history were reviewed and updated as appropriate: allergies, current medications, past family history, past medical history, past social history, past surgical history and problem list. Problem list updated.  Objective:   Vitals:   03/17/18 0913  BP: 137/87  Pulse: 96  Weight: 153 lb 9.6 oz (69.7 kg)    Fetal Status: Fetal Heart Rate (bpm): 136/139   Movement: Present  Presentation: Vertex  General:  Alert, oriented and cooperative. Patient is in no acute distress.  Skin: Skin is warm and dry. No rash noted.   Cardiovascular: Normal heart rate noted  Respiratory: Normal respiratory effort, no problems with respiration noted  Abdomen: Soft, gravid, appropriate for gestational age.  Pain/Pressure: Present     Pelvic: Cervical exam performed Dilation: 4 Effacement (%): 50 Station: -2  Extremities: Normal range of motion.  Edema: None  Mental Status: Normal mood and affect. Normal behavior. Normal judgment and thought content.   [place] Mfm Fetal Bpp Wo Non Stress  Result Date: 03/16/2018 ----------------------------------------------------------------------  OBSTETRICS REPORT                       (Signed Final 03/16/2018 11:20 pm) ---------------------------------------------------------------------- Patient Info  ID #:       [id number]                          D.O.B.:  1990/11/05 (26  yrs)  Name:       Courtney [name]                 Visit Date: 03/16/2018 10:11 am ---------------------------------------------------------------------- Performed By  Performed By:     Earley Brooke     Ref. Address:      [facility]                                                              [facility]                                                              [address]                                                              [place], [place]                                                              [address]  Attending:        Particia Nearing MD       Location:          [facility]  Referred By:      [facility] for                    [facility]                    Healthcare ---------------------------------------------------------------------- Orders   #  Description                                 Code   1  [place] MFM OB FOLLOW UP                         60454.09   2  [place] MFM OB FOLLOW UP ADDL GEST               76816.02   3  [place] MFM FETAL BPP WO NON STRESS              76819.01   4  [place] MFM FETAL BPP WO NST ADDL                81191.4      GESTATION   5  [place] MFM UA CORD DOPPLER                      76820.02   6  [place] MFM UA DOPPLER ADDL GEST RE              76820.03      EVAL  ----------------------------------------------------------------------   #  Ordered By               Order #        Accession #    Episode #   1  Ledon Snare              [id number]      [id number]     [id number]   2  BRIAN BROST              [id number]      [id number]     [id number]   3  MARK NEWMAN              [id number]      [id number]     [id number]   4  MARK NEWMAN              [id number]  [id number]     [id number]   5  MARTHA DECKER            [id number]      [id number]     [id number]   6  MARTHA DECKER            [id number]      [id number]     [id number]   ---------------------------------------------------------------------- Indications   [redacted] weeks gestation of pregnancy                Z3A.36   Twin pregnancy, mono/di, third trimester       O30.033   Hypertension - Chronic/Pre-existing            O10.019   Poor obstetric history: Previous fetal growth  O09.299   restriction (FGR)   Small for gestational age fetus affecting      O36.5990   management of mother  ---------------------------------------------------------------------- OB History  Gravidity:    3         Term:   1        Prem:   0         SAB:   1  TOP:          0       Ectopic:  0        Living: 1 ---------------------------------------------------------------------- Fetal Evaluation (Fetus A)  Num Of Fetuses:     2  Fetal Heart         123  Rate(bpm):  Cardiac Activity:   Observed  Fetal Lie:          Maternal left side  Presentation:       Cephalic  Placenta:           Posterior, above cervical os  P. Cord Insertion:  Previously Visualized  Membrane Desc:      Dividing Membrane seen - Monochorionic  Amniotic Fluid  AFI FV:      Subjectively within normal limits                              Largest Pocket(cm)                              5.8 ---------------------------------------------------------------------- Biophysical Evaluation (Fetus A)  Amniotic F.V:   Within normal limits       F. Tone:         Observed  F. Movement:    Observed                   Score:           8/8  F. Breathing:   Observed ---------------------------------------------------------------------- Biometry (Fetus A)  BPD:      85.7  mm     G. Age:  34w 4d         13  %    CI:         74.65  %    70 - 86                                                          FL/HC:       20.9  %    20.1 -  22.1  HC:      314.8  mm     G. Age:  35w 2d          7  %    HC/AC:       1.05       0.93 - 1.11  AC:      301.2  mm     G. Age:  34w 1d          8  %    FL/BPD:      76.9  %    71 - 87  FL:       65.9  mm     G. Age:  34w 0d          4  %     FL/AC:       21.9  %    20 - 24  Est. FW:    2377   gm     5 lb 4 oz     17  %     FW Discordancy         2   % ---------------------------------------------------------------------- Gestational Age (Fetus A)  LMP:           37w 2d        Date:  06/28/17                 EDD:    04/04/18  U/S Today:     34w 4d                                        EDD:    04/23/18  Best:          36w 3d     Det. ByMarcella Dubs         EDD:    04/10/18                                      (09/04/17) ---------------------------------------------------------------------- Anatomy (Fetus A)  Cranium:               Appears normal         Aortic Arch:            Previously seen  Cavum:                 Previously seen        Ductal Arch:            Previously seen  Ventricles:            Previously seen        Diaphragm:              Appears normal  Choroid Plexus:        Previously seen        Stomach:                Appears normal, left  sided  Cerebellum:            Previously seen        Abdomen:                Appears normal  Posterior Fossa:       Previously seen        Abdominal Wall:         Previously seen  Nuchal Fold:           Previously seen        Cord Vessels:           Previously seen  Face:                  Orbits and profile     Kidneys:                Appear normal                         previously seen  Lips:                  Previously seen        Bladder:                Appears normal  Thoracic:              Appears normal         Spine:                  Previously seen  Heart:                 Appears normal         Upper Extremities:      Previously seen                         (4CH, axis, and situs  RVOT:                  Previously seen        Lower Extremities:      Previously seen  LVOT:                  Previously seen  Other:  Female gender previously seen. ---------------------------------------------------------------------- Doppler -  Fetal Vessels (Fetus A)  Umbilical Artery   S/D     %tile                                      PSV   ADFV    RDFV                                                   (cm/s)  1.97        23                                    47.51      No       No ---------------------------------------------------------------------- Fetal Evaluation (Fetus B)  Num Of Fetuses:     2  Fetal Heart         121  Rate(bpm):  Cardiac Activity:   Observed  Fetal Lie:          Maternal right side  Presentation:       Cephalic  Placenta:           Posterior, above cervical os  P. Cord Insertion:  Previously Visualized  Membrane Desc:      Dividing Membrane seen - Monochorionic  Amniotic Fluid  AFI FV:      Subjectively within normal limits                              Largest Pocket(cm)                              8 ---------------------------------------------------------------------- Biophysical Evaluation (Fetus B)  Amniotic F.V:   Within normal limits       F. Tone:         Observed  F. Movement:    Observed                   Score:           8/8  F. Breathing:   Observed ---------------------------------------------------------------------- Biometry (Fetus B)  BPD:      83.6  mm     G. Age:  33w 4d          3  %    CI:         67.53  %    70 - 86                                                          FL/HC:       20.8  %    20.1 - 22.1  HC:      325.6  mm     G. Age:  36w 6d         33  %    HC/AC:       1.09       0.93 - 1.11  AC:      299.5  mm     G. Age:  34w 0d          6  %    FL/BPD:      81.0  %    71 - 87  FL:       67.7  mm     G. Age:  34w 5d         12  %    FL/AC:       22.6  %    20 - 24  Est. FW:    2432   gm     5 lb 6 oz     20  %     FW Discordancy      0 \ 2  % ---------------------------------------------------------------------- Gestational Age (Fetus B)  LMP:           37w 2d        Date:  06/28/17                 EDD:    04/04/18  U/S Today:     34w 6d  EDD:    04/21/18  Best:           36w 3d     Det. ByMarcella Dubs         EDD:    04/10/18                                      (09/04/17) ---------------------------------------------------------------------- Anatomy (Fetus B)  Cranium:               Appears normal         Aortic Arch:            Previously seen  Cavum:                 Previously seen        Ductal Arch:            Previously seen  Ventricles:            Previously seen        Diaphragm:              Appears normal  Choroid Plexus:        Previously seen        Stomach:                Appears normal, left                                                                        sided  Cerebellum:            Previously seen        Abdomen:                Appears normal  Posterior Fossa:       Previously seen        Abdominal Wall:         Previously seen  Nuchal Fold:           Previously seen        Cord Vessels:           Previously seen  Face:                  Orbits and profile     Kidneys:                Previously seen                         previously seen  Lips:                  Previously seen        Bladder:                Appears normal  Thoracic:              Appears normal         Spine:                  Previously seen  Heart:                 Previously  seen        Upper Extremities:      Previously seen  RVOT:                  Previously seen        Lower Extremities:      Previously seen  LVOT:                  Previously seen  Other:  Female gender previously seen. ---------------------------------------------------------------------- Doppler - Fetal Vessels (Fetus B)  Umbilical Artery   S/D     %tile                                      PSV   ADFV    RDFV                                                   (cm/s)  2.27        43                                    47.49      No       No ---------------------------------------------------------------------- Cervix Uterus Adnexa  Cervix  Not visualized (advanced GA >29wks)  ---------------------------------------------------------------------- Impression  Monochorionic/diamniotic twin pregnancy at 36+3 weeks  Normal interval anatomy x 2; anatomic survey complete x 2  Normal amniotic fluid volume x 2  Twin A: Cephalic presentation  EFW at the 17th %tile; AC at the 8th %tile  BPP 8/8  UA dopplers were normal for this GA  Twin B: Cephalic presentation  EFW at the 20th %tile; AC at the 6th %tile  BPP 8/8  UA dopplers were normal for this GA ---------------------------------------------------------------------- Recommendations  Deliver by 37 weeks ----------------------------------------------------------------------                 Particia Nearing, MD Electronically Signed Final Report   03/16/2018 11:20 pm ----------------------------------------------------------------------  [place] Mfm Fetal Bpp Wo Non Stress  Result Date: 03/09/2018 ----------------------------------------------------------------------  OBSTETRICS REPORT                      (Signed Final 03/09/2018 03:21 pm) ---------------------------------------------------------------------- Patient Info  ID #:       [id number]                          D.O.B.:  September 06, 1991 (26 yrs)  Name:       Courtney [name]                 Visit Date: 03/09/2018 02:23 pm ---------------------------------------------------------------------- Performed By  Performed By:     Lenise Arena        Referred By:      Manual Meier MD  Attending:        Charlsie Merles MD  Location:         [facility] ---------------------------------------------------------------------- Orders   #  Description                                 Code   1  [place] MFM FETAL BPP WO NON STRESS              76819.01   2  [place] MFM FETAL BPP WO NST ADDL                [address].6      GESTATION  ----------------------------------------------------------------------   #  Ordered By               Order #        Accession #     Episode #   1  MARK NEWMAN              [id number]      [id number]     [id number]   2  MARK NEWMAN              [id number]      [id number]     [id number]  ---------------------------------------------------------------------- Indications   [redacted] weeks gestation of pregnancy                Z3A.35   Twin pregnancy, mono/di, third trimester       O30.033   Hypertension - Chronic/Pre-existing            O10.019   Poor obstetric history: Previous fetal growth  O09.299   restriction (FGR)  ---------------------------------------------------------------------- OB History  Gravidity:    3         Term:   1        Prem:   0        SAB:   1  TOP:          0       Ectopic:  0        Living: 1 ---------------------------------------------------------------------- Fetal Evaluation (Fetus A)  Num Of Fetuses:     2  Fetal Heart         132  Rate(bpm):  Cardiac Activity:   Observed  Fetal Lie:          Maternal left side  Presentation:       Cephalic  Placenta:           Posterior, above cervical os  Membrane Desc:      Dividing Membrane seen - Monochorionic  Amniotic Fluid  AFI FV:      Subjectively within normal limits                              Largest Pocket(cm)                              4.37 ---------------------------------------------------------------------- Biophysical Evaluation (Fetus A)  Amniotic F.V:   Within normal limits       F. Tone:        Observed  F. Movement:    Observed                   Score:          8/8  F. Breathing:   Observed ---------------------------------------------------------------------- Gestational Age (Fetus A)  LMP:  36w 2d        Date:  06/28/17                 EDD:   04/04/18  Best:          Consuello Closs 2d     Det. ByMarcella Dubs         EDD:   04/11/18                                      (09/04/17) ---------------------------------------------------------------------- Anatomy (Fetus A)  Thoracic:              Appears normal         Kidneys:                Appear normal  Stomach:                Appears normal, left   Bladder:                Appears normal                         sided  Abdomen:               Appears normal ---------------------------------------------------------------------- Fetal Evaluation (Fetus B)  Num Of Fetuses:     2  Fetal Heart         140  Rate(bpm):  Cardiac Activity:   Observed  Fetal Lie:          Upper Fetus  Presentation:       Transverse, head to maternal right  Placenta:           Posterior, above cervical os  Membrane Desc:      Dividing Membrane seen - Monochorionic  Amniotic Fluid  AFI FV:      Subjectively within normal limits                              Largest Pocket(cm)                              4.65 ---------------------------------------------------------------------- Biophysical Evaluation (Fetus B)  Amniotic F.V:   Within normal limits       F. Tone:        Observed  F. Movement:    Observed                   Score:          8/8  F. Breathing:   Observed ---------------------------------------------------------------------- Gestational Age (Fetus B)  LMP:           36w 2d        Date:  06/28/17                 EDD:   04/04/18  Best:          Consuello Closs 2d     Det. ByMarcella Dubs         EDD:   04/11/18                                      (09/04/17) ---------------------------------------------------------------------- Anatomy (Fetus B)  Thoracic:  Appears normal         Kidneys:                Appear normal  Stomach:               Appears normal, left   Bladder:                Appears normal                         sided  Abdomen:               Appears normal ---------------------------------------------------------------------- Cervix Uterus Adnexa  Cervix  Not visualized (advanced GA >29wks) ---------------------------------------------------------------------- Impression  Monochorionic/diamniotic twin gestation at 35+2 weeks with  [facility] here for TTTS surveillance and BPP  Normal fetal cardiac activity in both twins  Normal fetal  movement from both twins  Normal amniotic fluid in both gestational sacs, with MVPs of  3.9 cm for A and 3.7 cm for B  Normal bladders and stomachs visualized in both twins  BPP 8/8 for both twins ---------------------------------------------------------------------- Recommendations  Continue weekly BPPs along with TTTS surveillance. Repeat  growth scan next week ----------------------------------------------------------------------                 Charlsie Merles, MD Electronically Signed Final Report   03/09/2018 03:21 pm ----------------------------------------------------------------------  [place] Mfm Fetal Bpp Wo Non Stress  Result Date: 03/02/2018 ----------------------------------------------------------------------  OBSTETRICS REPORT                      (Signed Final 03/02/2018 10:55 am) ---------------------------------------------------------------------- Patient Info  ID #:       [id number]                          D.O.B.:  [birth year] (26 yrs)  Name:       Courtney [name]                 Visit Date: 03/02/2018 09:55 am ---------------------------------------------------------------------- Performed By  Performed By:     Hurman Horn          Referred By:      Manual Meier MD  Attending:        Charlsie Merles MD         Location:         [facility] ---------------------------------------------------------------------- Orders   #  Description                                 Code   1  [place] MFM OB LIMITED                           [id number]   2  [place] MFM FETAL BPP WO NON STRESS              76819.01   3  [place] MFM FETAL BPP WO NST ADDL                30865.1  GESTATION  ----------------------------------------------------------------------   #  Ordered By               Order #        Accession #    Episode #   1  Ledon Snare              [id number]      [id number]     [id number]   2  MARK NEWMAN              [id number]      [id number]     [id number]   3   MARK NEWMAN              [id number]      [id number]     [id number]  ---------------------------------------------------------------------- Indications   [redacted] weeks gestation of pregnancy                Z3A.30   Twin pregnancy, mono/di, third trimester       O30.033   Hypertension - Chronic/Pre-existing            O10.019   Poor obstetric history: Previous fetal growth  O09.299   restriction (FGR)  ---------------------------------------------------------------------- OB History  Gravidity:    3         Term:   1        Prem:   0        SAB:   1  TOP:          0       Ectopic:  0        Living: 1 ---------------------------------------------------------------------- Fetal Evaluation (Fetus A)  Num Of Fetuses:     2  Fetal Heart         135  Rate(bpm):  Cardiac Activity:   Observed  Fetal Lie:          Maternal left side  Presentation:       Cephalic  Placenta:           Posterior, above cervical os  Amniotic Fluid  AFI FV:      Subjectively within normal limits                              Largest Pocket(cm)                              3.89 ---------------------------------------------------------------------- Biophysical Evaluation (Fetus A)  Amniotic F.V:   Within normal limits       F. Tone:        Observed  F. Movement:    Observed                   Score:          8/8  F. Breathing:   Observed ---------------------------------------------------------------------- Gestational Age (Fetus A)  LMP:           35w 2d        Date:  06/28/17                 EDD:   04/04/18  Best:          34w 2d     Det. ByMarcella Dubs         EDD:   04/11/18                                      (  09/04/17) ---------------------------------------------------------------------- Fetal Evaluation (Fetus B)  Num Of Fetuses:     2  Fetal Heart         146  Rate(bpm):  Cardiac Activity:   Observed  Fetal Lie:          Upper Fetus  Presentation:       Transverse, head to maternal right  Placenta:           Posterior, above cervical os  P.  Cord Insertion:  Visualized, central  Membrane Desc:      Dividing Membrane seen - Monochorionic  Amniotic Fluid  AFI FV:      Subjectively within normal limits                              Largest Pocket(cm)                              3.73 ---------------------------------------------------------------------- Biophysical Evaluation (Fetus B)  Amniotic F.V:   Within normal limits       F. Tone:        Observed  F. Movement:    Observed                   Score:          8/8  F. Breathing:   Observed ---------------------------------------------------------------------- Gestational Age (Fetus B)  LMP:           35w 2d        Date:  06/28/17                 EDD:   04/04/18  Best:          34w 2d     Det. ByMarcella Dubs         EDD:   04/11/18                                      (09/04/17) ---------------------------------------------------------------------- Impression  Monochorionic/diamniotic twin gestation at 34+2 weeks with  [facility] here for TTTS surveillance and BPP  Normal fetal cardiac activity in both twins  Normal fetal movement from both twins  Normal amniotic fluid in both gestational sacs, with MVPs of  3.9 cm for A and 3.7 cm for B  Normal bladders and stomachs visualized in both twins  BPP 8/8 for both twins ---------------------------------------------------------------------- Recommendations  Continue weekly BPPs along with TTTS surveillance ----------------------------------------------------------------------                 Charlsie Merles, MD Electronically Signed Final Report   03/02/2018 10:55 am ----------------------------------------------------------------------  [place] Mfm Fetal Bpp Wo Non Stress  Result Date: 02/23/2018 ----------------------------------------------------------------------  OBSTETRICS REPORT                      (Signed Final 02/23/2018 03:34 pm) ---------------------------------------------------------------------- Patient Info  ID #:       [id number]                           D.O.B.:  1991-07-12 (26 yrs)  Name:       Courtney [name]                 Visit Date: 02/23/2018 02:26 pm ---------------------------------------------------------------------- Performed By  Performed By:  Devin Vics             Referred By:      Clyde Lundborg MD  Attending:        Charlsie Merles MD         Location:         [facility] ---------------------------------------------------------------------- Orders   #  Description                                 Code   1  [place] MFM FETAL BPP WO NON STRESS              76819.01   2  [place] MFM FETAL BPP WO NST ADDL                [address].6      GESTATION  ----------------------------------------------------------------------   #  Ordered By               Order #        Accession #    Episode #   1  MARK NEWMAN              [id number]      [id number]     [id number]   2  MARK NEWMAN              [id number]      [id number]     [id number]  ---------------------------------------------------------------------- Indications   [redacted] weeks gestation of pregnancy                Z3A.33   Twin pregnancy, mono/di, third trimester       O30.033   Hypertension - Chronic/Pre-existing            O10.019   Poor obstetric history: Previous fetal growth  O09.299   restriction (FGR)  ---------------------------------------------------------------------- OB History  Gravidity:    3         Term:   1        Prem:   0        SAB:   1  TOP:          0       Ectopic:  0        Living: 1 ---------------------------------------------------------------------- Fetal Evaluation (Fetus A)  Num Of Fetuses:     2  Fetal Heart         137  Rate(bpm):  Cardiac Activity:   Observed  Fetal Lie:          Maternal left side  Presentation:       Cephalic  Membrane Desc:      Dividing Membrane seen  Amniotic Fluid  AFI FV:      Subjectively within normal limits                              Largest Pocket(cm)                              6.94  ---------------------------------------------------------------------- Biophysical Evaluation (  Fetus A)  Amniotic F.V:   Within normal limits       F. Tone:        Observed  F. Movement:    Observed                   Score:          8/8  F. Breathing:   Observed ---------------------------------------------------------------------- Gestational Age (Fetus A)  LMP:           34w 2d        Date:  06/28/17                 EDD:   04/04/18  Best:          33w 2d     Det. ByMarcella Dubs         EDD:   04/11/18                                      (09/04/17) ---------------------------------------------------------------------- Anatomy (Fetus A)  Stomach:               Appears normal, left   Bladder:                Appears normal                         sided  Kidneys:               Appear normal ---------------------------------------------------------------------- Fetal Evaluation (Fetus B)  Num Of Fetuses:     2  Fetal Heart         136  Rate(bpm):  Cardiac Activity:   Observed  Fetal Lie:          Upper Fetus  Presentation:       Transverse, head to maternal right  Membrane Desc:      Dividing Membrane seen  Amniotic Fluid  AFI FV:      Subjectively within normal limits                              Largest Pocket(cm)                              6.38 ---------------------------------------------------------------------- Biophysical Evaluation (Fetus B)  Amniotic F.V:   Within normal limits       F. Tone:        Observed  F. Movement:    Observed                   Score:          8/8  F. Breathing:   Observed ---------------------------------------------------------------------- Gestational Age (Fetus B)  LMP:           34w 2d        Date:  06/28/17                 EDD:   04/04/18  Best:          33w 2d     Det. ByMarcella Dubs         EDD:   04/11/18                                      (  09/04/17) ---------------------------------------------------------------------- Anatomy (Fetus B)  Stomach:                Appears normal, left   Bladder:                Appears normal                         sided ---------------------------------------------------------------------- Cervix Uterus Adnexa  Cervix  Not visualized (advanced GA >29wks) ---------------------------------------------------------------------- Impression  Monochorionic/diamniotic twin gestation at 33+2 weeks with  [facility] here for TTTS surveillance and BPP  Normal fetal cardiac activity in both twins  Normal fetal movement from both twins  Normal amniotic fluid in both gestational sacs, with MVPs of  6.9 cm for A and 6.4 cm for B  Normal bladders and stomachs visualized in both twins  BPP 8/8 for both twins ---------------------------------------------------------------------- Recommendations  Continue weekly BPPs along with TTTS surveillance ----------------------------------------------------------------------                 Charlsie Merles, MD Electronically Signed Final Report   02/23/2018 03:34 pm ----------------------------------------------------------------------  [place] Mfm Fetal Bpp Wo Non Stress  Result Date: 02/18/2018 ----------------------------------------------------------------------  OBSTETRICS REPORT                      (Signed Final 02/18/2018 08:24 am) ---------------------------------------------------------------------- Patient Info  ID #:       [id number]                          D.O.B.:  1991/04/06 (26 yrs)  Name:       Courtney [name]                 Visit Date: 02/17/2018 04:19 pm ---------------------------------------------------------------------- Performed By  Performed By:     Emeline Darling BS,      Referred By:      Manual Meier MD  Attending:        Charlsie Merles MD         Location:         [facility] ---------------------------------------------------------------------- Orders   #  Description                                 Code   1  [place] MFM FETAL BPP WO NON STRESS               76819.01   2  [place] MFM FETAL BPP WO NST ADDL                40981.1      GESTATION  ----------------------------------------------------------------------   #  Ordered By               Order #        Accession #    Episode #   1  Scheryl Darter             [id number]      [id number]     [id number]   2  JAMES ARNOLD  [id number]      [id number]     [id number]  ---------------------------------------------------------------------- Indications   [redacted] weeks gestation of pregnancy                Z3A.32   Twin pregnancy, mono/di, third trimester       O30.033   Hypertension - Chronic/Pre-existing            O10.019   Poor obstetric history: Previous fetal growth  O09.299   restriction (FGR)  ---------------------------------------------------------------------- OB History  Gravidity:    3         Term:   1        Prem:   0        SAB:   1  TOP:          0       Ectopic:  0        Living: 1 ---------------------------------------------------------------------- Fetal Evaluation (Fetus A)  Num Of Fetuses:     2  Fetal Heart         126  Rate(bpm):  Cardiac Activity:   Observed  Fetal Lie:          Maternal left side  Presentation:       Cephalic  Placenta:           Posterior, above cervical os  Membrane Desc:      Dividing Membrane seen  Amniotic Fluid  AFI FV:      Subjectively within normal limits                              Largest Pocket(cm)                              6.8 ---------------------------------------------------------------------- Biophysical Evaluation (Fetus A)  Amniotic F.V:   Pocket => 2 cm two         F. Tone:        Observed                  planes  F. Movement:    Observed                   Score:          8/8  F. Breathing:   Observed ---------------------------------------------------------------------- Gestational Age (Fetus A)  LMP:           33w 3d        Date:  06/28/17                 EDD:   04/04/18  Best:          Armida Sans 3d     Det. ByMarcella Dubs         EDD:   04/11/18                                       (09/04/17) ---------------------------------------------------------------------- Fetal Evaluation (Fetus B)  Num Of Fetuses:     2  Fetal Heart         142  Rate(bpm):  Cardiac Activity:   Observed  Fetal Lie:          Maternal right side  Presentation:       Transverse, head to maternal right  Placenta:  Posterior, above cervical os  Membrane Desc:      Dividing Membrane seen  Amniotic Fluid  AFI FV:      Subjectively within normal limits                              Largest Pocket(cm)                              4.5 ---------------------------------------------------------------------- Biophysical Evaluation (Fetus B)  Amniotic F.V:   Pocket => 2 cm two         F. Tone:        Observed                  planes  F. Movement:    Observed                   Score:          8/8  F. Breathing:   Observed ---------------------------------------------------------------------- Gestational Age (Fetus B)  LMP:           33w 3d        Date:  06/28/17                 EDD:   04/04/18  Best:          Armida Sans 3d     Det. ByMarcella Dubs         EDD:   04/11/18                                      (09/04/17) ---------------------------------------------------------------------- Impression  Monochorionic/diamniotic twin gestation at 32+3 weeks with  [facility] here for TTTS surveillance  Normal fetal cardiac activity in both twins  Normal fetal movement from both twins  Normal amniotic fluid in both gestational sacs, with MVPs of  6.8 cm for A and 4.5 cm for B  Normal bladders and stomachs visualized in both twins  BPP 8/8 for both twins ---------------------------------------------------------------------- Recommendations  Start weekly BPP due to [facility] [place] [facility], along with TTTS surveillance ----------------------------------------------------------------------                 Charlsie Merles, MD Electronically Signed Final Report   02/18/2018 08:24 am  ----------------------------------------------------------------------  [place] Mfm Ob Follow Up  Result Date: 03/16/2018 ----------------------------------------------------------------------  OBSTETRICS REPORT                       (Signed Final 03/16/2018 11:20 pm) ---------------------------------------------------------------------- Patient Info  ID #:       [id number]                          D.O.B.:  1991-08-18 (26 yrs)  Name:       Courtney [name]                 Visit Date: 03/16/2018 10:11 am ---------------------------------------------------------------------- Performed By  Performed By:     Earley Brooke     Ref. Address:      [facility]                                                              [address]                                                              [place], [place]                                                              [address]  Attending:        Particia Nearing MD       Location:          [facility]  Referred By:      [facility]                    [facility]                    Healthcare ---------------------------------------------------------------------- Orders   #  Description                                 Code   1  [place] MFM OB FOLLOW UP                         60454.09   2  [place] MFM OB FOLLOW UP ADDL GEST               81191.47   3  [place] MFM FETAL BPP WO NON STRESS              76819.01   4  [place] MFM FETAL BPP WO NST ADDL                76819.1      GESTATION   5  [place] MFM UA CORD DOPPLER                      76820.02   6  [place] MFM UA DOPPLER ADDL GEST RE              82956.21      EVAL  ----------------------------------------------------------------------   #  Ordered By               Order #  Accession #    Episode #   1  BRIAN BROST              [id number]       [id number]     [id number]   2  BRIAN BROST              [id number]      [id number]     [id number]   3  MARK NEWMAN              [id number]      [id number]     [id number]   4  MARK NEWMAN              [id number]      [id number]     [id number]   5  MARTHA DECKER            [id number]      [id number]     [id number]   6  MARTHA DECKER            [id number]      [id number]     [id number]  ---------------------------------------------------------------------- Indications   [redacted] weeks gestation of pregnancy                Z3A.36   Twin pregnancy, mono/di, third trimester       O30.033   Hypertension - Chronic/Pre-existing            O10.019   Poor obstetric history: Previous fetal growth  O09.299   restriction (FGR)   Small for gestational age fetus affecting      O36.5990   management of mother  ---------------------------------------------------------------------- OB History  Gravidity:    3         Term:   1        Prem:   0         SAB:   1  TOP:          0       Ectopic:  0        Living: 1 ---------------------------------------------------------------------- Fetal Evaluation (Fetus A)  Num Of Fetuses:     2  Fetal Heart         123  Rate(bpm):  Cardiac Activity:   Observed  Fetal Lie:          Maternal left side  Presentation:       Cephalic  Placenta:           Posterior, above cervical os  P. Cord Insertion:  Previously Visualized  Membrane Desc:      Dividing Membrane seen - Monochorionic  Amniotic Fluid  AFI FV:      Subjectively within normal limits                              Largest Pocket(cm)                              5.8 ---------------------------------------------------------------------- Biophysical Evaluation (Fetus A)  Amniotic F.V:   Within normal limits       F. Tone:         Observed  F. Movement:    Observed                   Score:           8/8  F. Breathing:   Observed ---------------------------------------------------------------------- Biometry (  Fetus A)  BPD:      85.7  mm     G. Age:  34w 4d          13  %    CI:         74.65  %    70 - 86                                                          FL/HC:       20.9  %    20.1 - 22.1  HC:      314.8  mm     G. Age:  35w 2d          7  %    HC/AC:       1.05       0.93 - 1.11  AC:      301.2  mm     G. Age:  34w 1d          8  %    FL/BPD:      76.9  %    71 - 87  FL:       65.9  mm     G. Age:  34w 0d          4  %    FL/AC:       21.9  %    20 - 24  Est. FW:    2377   gm     5 lb 4 oz     17  %     FW Discordancy         2   % ---------------------------------------------------------------------- Gestational Age (Fetus A)  LMP:           37w 2d        Date:  06/28/17                 EDD:    04/04/18  U/S Today:     34w 4d                                        EDD:    04/23/18  Best:          36w 3d     Det. By:  Marcella Dubs         EDD:    04/10/18                                      (09/04/17) ---------------------------------------------------------------------- Anatomy (Fetus A)  Cranium:               Appears normal         Aortic Arch:            Previously seen  Cavum:                 Previously seen        Ductal Arch:            Previously seen  Ventricles:            Previously seen  Diaphragm:              Appears normal  Choroid Plexus:        Previously seen        Stomach:                Appears normal, left                                                                        sided  Cerebellum:            Previously seen        Abdomen:                Appears normal  Posterior Fossa:       Previously seen        Abdominal Wall:         Previously seen  Nuchal Fold:           Previously seen        Cord Vessels:           Previously seen  Face:                  Orbits and profile     Kidneys:                Appear normal                         previously seen  Lips:                  Previously seen        Bladder:                Appears normal  Thoracic:              Appears normal         Spine:                  Previously seen   Heart:                 Appears normal         Upper Extremities:      Previously seen                         (4CH, axis, and situs  RVOT:                  Previously seen        Lower Extremities:      Previously seen  LVOT:                  Previously seen  Other:  Female gender previously seen. ---------------------------------------------------------------------- Doppler - Fetal Vessels (Fetus A)  Umbilical Artery   S/D     %tile                                      PSV   ADFV    RDFV                                                   (  cm/s)  1.97        23                                    47.51      No       No ---------------------------------------------------------------------- Fetal Evaluation (Fetus B)  Num Of Fetuses:     2  Fetal Heart         121  Rate(bpm):  Cardiac Activity:   Observed  Fetal Lie:          Maternal right side  Presentation:       Cephalic  Placenta:           Posterior, above cervical os  P. Cord Insertion:  Previously Visualized  Membrane Desc:      Dividing Membrane seen - Monochorionic  Amniotic Fluid  AFI FV:      Subjectively within normal limits                              Largest Pocket(cm)                              8 ---------------------------------------------------------------------- Biophysical Evaluation (Fetus B)  Amniotic F.V:   Within normal limits       F. Tone:         Observed  F. Movement:    Observed                   Score:           8/8  F. Breathing:   Observed ---------------------------------------------------------------------- Biometry (Fetus B)  BPD:      83.6  mm     G. Age:  33w 4d          3  %    CI:         67.53  %    70 - 86                                                          FL/HC:       20.8  %    20.1 - 22.1  HC:      325.6  mm     G. Age:  36w 6d         33  %    HC/AC:       1.09       0.93 - 1.11  AC:      299.5  mm     G. Age:  34w 0d          6  %    FL/BPD:      81.0  %    71 - 87  FL:       67.7  mm     G. Age:  34w 5d         12   %    FL/AC:       22.6  %    20 - 24  Est. FW:    2432   gm     5 lb 6 oz  20  %     FW Discordancy      0 \ 2  % ---------------------------------------------------------------------- Gestational Age (Fetus B)  LMP:           37w 2d        Date:  06/28/17                 EDD:    04/04/18  U/S Today:     34w 6d                                        EDD:    04/21/18  Best:          36w 3d     Det. ByMarcella Dubs         EDD:    04/10/18                                      (09/04/17) ---------------------------------------------------------------------- Anatomy (Fetus B)  Cranium:               Appears normal         Aortic Arch:            Previously seen  Cavum:                 Previously seen        Ductal Arch:            Previously seen  Ventricles:            Previously seen        Diaphragm:              Appears normal  Choroid Plexus:        Previously seen        Stomach:                Appears normal, left                                                                        sided  Cerebellum:            Previously seen        Abdomen:                Appears normal  Posterior Fossa:       Previously seen        Abdominal Wall:         Previously seen  Nuchal Fold:           Previously seen        Cord Vessels:           Previously seen  Face:                  Orbits and profile     Kidneys:                Previously seen  previously seen  Lips:                  Previously seen        Bladder:                Appears normal  Thoracic:              Appears normal         Spine:                  Previously seen  Heart:                 Previously seen        Upper Extremities:      Previously seen  RVOT:                  Previously seen        Lower Extremities:      Previously seen  LVOT:                  Previously seen  Other:  Female gender previously seen. ---------------------------------------------------------------------- Doppler - Fetal Vessels (Fetus B)  Umbilical  Artery   S/D     %tile                                      PSV   ADFV    RDFV                                                   (cm/s)  2.27        43                                    47.49      No       No ---------------------------------------------------------------------- Cervix Uterus Adnexa  Cervix  Not visualized (advanced GA >29wks) ---------------------------------------------------------------------- Impression  Monochorionic/diamniotic twin pregnancy at 36+3 weeks  Normal interval anatomy x 2; anatomic survey complete x 2  Normal amniotic fluid volume x 2  Twin A: Cephalic presentation  EFW at the 17th %tile; AC at the 8th %tile  BPP 8/8  UA dopplers were normal for this GA  Twin B: Cephalic presentation  EFW at the 20th %tile; AC at the 6th %tile  BPP 8/8  UA dopplers were normal for this GA ---------------------------------------------------------------------- Recommendations  Deliver by 37 weeks ----------------------------------------------------------------------                 Particia Nearing, MD Electronically Signed Final Report   03/16/2018 11:20 pm ----------------------------------------------------------------------  [place] Mfm Ob Follow Up  Result Date: 02/16/2018 ----------------------------------------------------------------------  OBSTETRICS REPORT                      (Signed Final 02/16/2018 11:47 am) ---------------------------------------------------------------------- Patient Info  ID #:       [id number]                          D.O.B.:  05-24-1991 (26 yrs)  Name:       Courtney [name]  Visit Date: 02/16/2018 11:29 am ---------------------------------------------------------------------- Performed By  Performed By:     Ellin Saba        Referred By:      Manual Meier MD  Attending:        Charlsie Merles MD         Location:         [facility]  ---------------------------------------------------------------------- Orders   #  Description                                 Code   1  [place] MFM OB FOLLOW UP                         [phone]   2  [place] MFM OB FOLLOW UP ADDL GEST               45409.81  ----------------------------------------------------------------------   #  Ordered By               Order #        Accession #    Episode #   1  Ledon Snare              [id number]      [id number]     [id number]   2  BRIAN BROST              [id number]      [id number]     [id number]  ---------------------------------------------------------------------- Indications   [redacted] weeks gestation of pregnancy                Z3A.32   Twin pregnancy, mono/di, third trimester       O30.033   Hypertension - Chronic/Pre-existing            O10.019   Poor obstetric history: Previous fetal growth  O09.299   restriction (FGR)  ---------------------------------------------------------------------- OB History  Gravidity:    3         Term:   1        Prem:   0        SAB:   1  TOP:          0       Ectopic:  0        Living: 1 ---------------------------------------------------------------------- Fetal Evaluation (Fetus A)  Num Of Fetuses:     2  Fetal Heart         133  Rate(bpm):  Cardiac Activity:   Observed  Fetal Lie:          Maternal left side  Presentation:       Cephalic  Placenta:           Posterior, above cervical os  Amniotic Fluid  AFI FV:      Subjectively within normal limits ---------------------------------------------------------------------- Biometry (Fetus A)  BPD:      80.4  mm     G. Age:  32w 2d         42  %    CI:  73.1   %    70 - 86                                                          FL/HC:      20.7   %    19.1 - 21.3  HC:      298.9  mm     G. Age:  33w 1d         35  %    HC/AC:      1.14        0.96 - 1.17  AC:       262   mm     G. Age:  30w 2d          7  %    FL/BPD:     77.0   %    71 - 87  FL:       61.9  mm     G. Age:  32w 0d         33  %     FL/AC:      23.6   %    20 - 24  HUM:      55.3  mm     G. Age:  32w 1d         50  %  CER:        43  mm     G. Age:  37w 0d       > 95  %  LV:        4.5  mm  Est. FW:    1743  gm    3 lb 13 oz      38  %     FW Discordancy         6  % ---------------------------------------------------------------------- Gestational Age (Fetus A)  LMP:           33w 2d        Date:  06/28/17                 EDD:   04/04/18  U/S Today:     32w 0d                                        EDD:   04/13/18  Best:          32w 2d     Det. By:  Marcella Dubs         EDD:   04/11/18                                      (09/04/17) ---------------------------------------------------------------------- Anatomy (Fetus A)  Cranium:               Appears normal         Aortic Arch:            Previously seen  Cavum:                 Appears normal         Ductal Arch:  Previously seen  Ventricles:            Appears normal         Diaphragm:              Previously seen  Choroid Plexus:        Previously seen        Stomach:                Appears normal, left                                                                        sided  Cerebellum:            Appears normal         Abdomen:                Appears normal  Posterior Fossa:       Appears normal         Abdominal Wall:         Previously seen  Nuchal Fold:           Previously seen        Cord Vessels:           Appears normal (3                                                                        vessel cord)  Face:                  Orbits and profile     Kidneys:                Appear normal                         previously seen  Lips:                  Appears normal         Bladder:                Appears normal  Thoracic:              Appears normal         Spine:                  Previously seen  Heart:                 Appears normal         Upper Extremities:      Previously seen                         (4CH, axis, and situs  RVOT:                  Appears  normal         Lower Extremities:      Previously seen  LVOT:                  Appears normal  Other:  Female gender previously seen. ---------------------------------------------------------------------- Fetal Evaluation (Fetus B)  Num Of Fetuses:     2  Cardiac Activity:   Observed  Fetal Lie:          Maternal right side  Presentation:       Cephalic  Placenta:           Posterior, above cervical os  Amniotic Fluid  AFI FV:      Subjectively within normal limits                              Largest Pocket(cm)                              6.1 ---------------------------------------------------------------------- Biometry (Fetus B)  BPD:      79.6  mm     G. Age:  32w 0d         32  %    CI:        69.11   %    70 - 86                                                          FL/HC:      20.8   %    19.1 - 21.3  HC:      305.8  mm     G. Age:  34w 0d         60  %    HC/AC:      1.14        0.96 - 1.17  AC:      268.1  mm     G. Age:  30w 6d         15  %    FL/BPD:     79.8   %    71 - 87  FL:       63.5  mm     G. Age:  32w 6d         53  %    FL/AC:      23.7   %    20 - 24  HUM:      55.6  mm     G. Age:  32w 3d         54  %  CER:      41.2  mm     G. Age:  35w 1d         85  %  LV:        4.7  mm  Est. FW:    1862  gm      4 lb 2 oz     49  %     FW Discordancy      0 \ 6 % ---------------------------------------------------------------------- Gestational Age (Fetus B)  LMP:           33w 2d        Date:  06/28/17                 EDD:   04/04/18  U/S Today:     32w 3d                                        EDD:   04/10/18  Best:          32w 2d     Det. ByMarcella Dubs         EDD:   04/11/18                                      (09/04/17) ---------------------------------------------------------------------- Anatomy (Fetus B)  Cranium:               Appears normal         Aortic Arch:            Appears normal  Cavum:                 Appears normal         Ductal Arch:            Appears normal   Ventricles:            Appears normal         Diaphragm:              Previously seen  Choroid Plexus:        Previously seen        Stomach:                Appears normal, left                                                                        sided  Cerebellum:            Appears normal         Abdomen:                Appears normal  Posterior Fossa:       Appears normal         Abdominal Wall:         Previously seen  Nuchal Fold:           Previously seen        Cord Vessels:           Appears normal (3                                                                        vessel cord)  Face:                  Appears normal         Kidneys:                Previously seen                         (  orbits and profile)  Lips:                  Appears normal         Bladder:                Appears normal  Thoracic:              Appears normal         Spine:                  Previously seen  Heart:                 Appears normal         Upper Extremities:      Previously seen                         (4CH, axis, and situs  RVOT:                  Appears normal         Lower Extremities:      Previously seen  LVOT:                  Appears normal  Other:  Female gender previously seen. ---------------------------------------------------------------------- Cervix Uterus Adnexa  Cervix  Not visualized (advanced GA >29wks) ---------------------------------------------------------------------- Impression  Monochorionic/diamniotic twin gestation at 32+2 weeks here  for TTTS surveillance and growth evaluation  Normal fetal cardiac activity in both twins  Normal fetal movement from both twins  Normal amniotic fluid in both gestational sacs, with MVPs of  4.5 cm for A and 6.1cm for B  Normal bladders and stomachs visualized in both twins  Twin A:  Presentation iscephalic  Interval review of fetal anatomy was normal  Appropriate interval growth with EFW at the 38th percentile  Twin B:  Presentation is cephalic  Interval review  of fetal anatomy was normal  Appropriate interval growth with EFW at the 49th percentile  Dicordance is 6% ---------------------------------------------------------------------- Recommendations  Repeat scan for TTTS surveillance in 2 weeks ----------------------------------------------------------------------                 Charlsie Merles, MD Electronically Signed Final Report   02/16/2018 11:47 am ----------------------------------------------------------------------  [place] Mfm Ob Follow Up Addl Gest  Result Date: 03/16/2018 ----------------------------------------------------------------------  OBSTETRICS REPORT                       (Signed Final 03/16/2018 11:20 pm) ---------------------------------------------------------------------- Patient Info  ID #:       [id number]                          D.O.B.:  1991-08-26 (26 yrs)  Name:       Courtney [name]                 Visit Date: 03/16/2018 10:11 am ---------------------------------------------------------------------- Performed By  Performed By:     Earley Brooke     Ref. Address:      [facility]                                                              [address]                                                              [place], [place]                                                              [address]  Attending:        Particia Nearing MD       Location:          [facility]  Referred By:      [facility] for                    [facility]                    Healthcare ---------------------------------------------------------------------- Orders   #  Description                                 Code   1  [place] MFM OB FOLLOW UP                         60454.09   2  [place] MFM OB FOLLOW UP ADDL GEST               81191.47   3  [place] MFM FETAL BPP WO NON STRESS               76819.01   4  [place] MFM FETAL BPP WO NST ADDL                76819.1      GESTATION   5  [place] MFM UA CORD DOPPLER                      76820.02   6  [place] MFM UA DOPPLER ADDL GEST RE              82956.21      EVAL  ----------------------------------------------------------------------   #  Ordered By               Order #  Accession #    Episode #   1  BRIAN BROST              [id number]      [id number]     [id number]   2  BRIAN BROST              [id number]      [id number]     [id number]   3  MARK NEWMAN              [id number]      [id number]     [id number]   4  MARK NEWMAN              [id number]      [id number]     [id number]   5  MARTHA DECKER            [id number]      [id number]     [id number]   6  MARTHA DECKER            [id number]      [id number]     [id number]  ---------------------------------------------------------------------- Indications   [redacted] weeks gestation of pregnancy                Z3A.36   Twin pregnancy, mono/di, third trimester       O30.033   Hypertension - Chronic/Pre-existing            O10.019   Poor obstetric history: Previous fetal growth  O09.299   restriction (FGR)   Small for gestational age fetus affecting      O36.5990   management of mother  ---------------------------------------------------------------------- OB History  Gravidity:    3         Term:   1        Prem:   0         SAB:   1  TOP:          0       Ectopic:  0        Living: 1 ---------------------------------------------------------------------- Fetal Evaluation (Fetus A)  Num Of Fetuses:     2  Fetal Heart         123  Rate(bpm):  Cardiac Activity:   Observed  Fetal Lie:          Maternal left side  Presentation:       Cephalic  Placenta:           Posterior, above cervical os  P. Cord Insertion:  Previously Visualized  Membrane Desc:      Dividing Membrane seen - Monochorionic  Amniotic Fluid  AFI FV:      Subjectively within normal limits                              Largest Pocket(cm)                               5.8 ---------------------------------------------------------------------- Biophysical Evaluation (Fetus A)  Amniotic F.V:   Within normal limits       F. Tone:         Observed  F. Movement:    Observed                   Score:           8/8  F. Breathing:   Observed ---------------------------------------------------------------------- Biometry (  Fetus A)  BPD:      85.7  mm     G. Age:  34w 4d         13  %    CI:         74.65  %    70 - 86                                                          FL/HC:       20.9  %    20.1 - 22.1  HC:      314.8  mm     G. Age:  35w 2d          7  %    HC/AC:       1.05       0.93 - 1.11  AC:      301.2  mm     G. Age:  34w 1d          8  %    FL/BPD:      76.9  %    71 - 87  FL:       65.9  mm     G. Age:  34w 0d          4  %    FL/AC:       21.9  %    20 - 24  Est. FW:    2377   gm     5 lb 4 oz     17  %     FW Discordancy         2   % ---------------------------------------------------------------------- Gestational Age (Fetus A)  LMP:           37w 2d        Date:  06/28/17                 EDD:    04/04/18  U/S Today:     34w 4d                                        EDD:    04/23/18  Best:          36w 3d     Det. By:  Marcella Dubs         EDD:    04/10/18                                      (09/04/17) ---------------------------------------------------------------------- Anatomy (Fetus A)  Cranium:               Appears normal         Aortic Arch:            Previously seen  Cavum:                 Previously seen        Ductal Arch:            Previously seen  Ventricles:            Previously seen  Diaphragm:              Appears normal  Choroid Plexus:        Previously seen        Stomach:                Appears normal, left                                                                        sided  Cerebellum:            Previously seen        Abdomen:                Appears normal  Posterior Fossa:       Previously seen        Abdominal Wall:          Previously seen  Nuchal Fold:           Previously seen        Cord Vessels:           Previously seen  Face:                  Orbits and profile     Kidneys:                Appear normal                         previously seen  Lips:                  Previously seen        Bladder:                Appears normal  Thoracic:              Appears normal         Spine:                  Previously seen  Heart:                 Appears normal         Upper Extremities:      Previously seen                         (4CH, axis, and situs  RVOT:                  Previously seen        Lower Extremities:      Previously seen  LVOT:                  Previously seen  Other:  Female gender previously seen. ---------------------------------------------------------------------- Doppler - Fetal Vessels (Fetus A)  Umbilical Artery   S/D     %tile                                      PSV   ADFV    RDFV                                                   (  cm/s)  1.97        23                                    47.51      No       No ---------------------------------------------------------------------- Fetal Evaluation (Fetus B)  Num Of Fetuses:     2  Fetal Heart         121  Rate(bpm):  Cardiac Activity:   Observed  Fetal Lie:          Maternal right side  Presentation:       Cephalic  Placenta:           Posterior, above cervical os  P. Cord Insertion:  Previously Visualized  Membrane Desc:      Dividing Membrane seen - Monochorionic  Amniotic Fluid  AFI FV:      Subjectively within normal limits                              Largest Pocket(cm)                              8 ---------------------------------------------------------------------- Biophysical Evaluation (Fetus B)  Amniotic F.V:   Within normal limits       F. Tone:         Observed  F. Movement:    Observed                   Score:           8/8  F. Breathing:   Observed ---------------------------------------------------------------------- Biometry (Fetus B)  BPD:       83.6  mm     G. Age:  33w 4d          3  %    CI:         67.53  %    70 - 86                                                          FL/HC:       20.8  %    20.1 - 22.1  HC:      325.6  mm     G. Age:  36w 6d         33  %    HC/AC:       1.09       0.93 - 1.11  AC:      299.5  mm     G. Age:  34w 0d          6  %    FL/BPD:      81.0  %    71 - 87  FL:       67.7  mm     G. Age:  34w 5d         12  %    FL/AC:       22.6  %    20 - 24  Est. FW:    2432   gm     5 lb 6 oz  20  %     FW Discordancy      0 \ 2  % ---------------------------------------------------------------------- Gestational Age (Fetus B)  LMP:           37w 2d        Date:  06/28/17                 EDD:    04/04/18  U/S Today:     34w 6d                                        EDD:    04/21/18  Best:          36w 3d     Det. ByMarcella Dubs         EDD:    04/10/18                                      (09/04/17) ---------------------------------------------------------------------- Anatomy (Fetus B)  Cranium:               Appears normal         Aortic Arch:            Previously seen  Cavum:                 Previously seen        Ductal Arch:            Previously seen  Ventricles:            Previously seen        Diaphragm:              Appears normal  Choroid Plexus:        Previously seen        Stomach:                Appears normal, left                                                                        sided  Cerebellum:            Previously seen        Abdomen:                Appears normal  Posterior Fossa:       Previously seen        Abdominal Wall:         Previously seen  Nuchal Fold:           Previously seen        Cord Vessels:           Previously seen  Face:                  Orbits and profile     Kidneys:                Previously seen  previously seen  Lips:                  Previously seen        Bladder:                Appears normal  Thoracic:              Appears normal         Spine:                   Previously seen  Heart:                 Previously seen        Upper Extremities:      Previously seen  RVOT:                  Previously seen        Lower Extremities:      Previously seen  LVOT:                  Previously seen  Other:  Female gender previously seen. ---------------------------------------------------------------------- Doppler - Fetal Vessels (Fetus B)  Umbilical Artery   S/D     %tile                                      PSV   ADFV    RDFV                                                   (cm/s)  2.27        43                                    47.49      No       No ---------------------------------------------------------------------- Cervix Uterus Adnexa  Cervix  Not visualized (advanced GA >29wks) ---------------------------------------------------------------------- Impression  Monochorionic/diamniotic twin pregnancy at 36+3 weeks  Normal interval anatomy x 2; anatomic survey complete x 2  Normal amniotic fluid volume x 2  Twin A: Cephalic presentation  EFW at the 17th %tile; AC at the 8th %tile  BPP 8/8  UA dopplers were normal for this GA  Twin B: Cephalic presentation  EFW at the 20th %tile; AC at the 6th %tile  BPP 8/8  UA dopplers were normal for this GA ---------------------------------------------------------------------- Recommendations  Deliver by 37 weeks ----------------------------------------------------------------------                 Particia Nearing, MD Electronically Signed Final Report   03/16/2018 11:20 pm ----------------------------------------------------------------------  [place] Mfm Ob Follow Up Addl Gest  Result Date: 02/16/2018 ----------------------------------------------------------------------  OBSTETRICS REPORT                      (Signed Final 02/16/2018 11:47 am) ---------------------------------------------------------------------- Patient Info  ID #:       [id number]                          D.O.B.:  [birth year] (26 yrs)  Name:       Courtney [name]  Visit Date: 02/16/2018 11:29 am ---------------------------------------------------------------------- Performed By  Performed By:     Ellin Saba        Referred By:      Manual Meier MD  Attending:        Charlsie Merles MD         Location:         [facility] ---------------------------------------------------------------------- Orders   #  Description                                 Code   1  [place] MFM OB FOLLOW UP                         [phone]   2  [place] MFM OB FOLLOW UP ADDL GEST               45409.81  ----------------------------------------------------------------------   #  Ordered By               Order #        Accession #    Episode #   1  Ledon Snare              [id number]      [id number]     [id number]   2  BRIAN BROST              [id number]      [id number]     [id number]  ---------------------------------------------------------------------- Indications   [redacted] weeks gestation of pregnancy                Z3A.32   Twin pregnancy, mono/di, third trimester       O30.033   Hypertension - Chronic/Pre-existing            O10.019   Poor obstetric history: Previous fetal growth  O09.299   restriction (FGR)  ---------------------------------------------------------------------- OB History  Gravidity:    3         Term:   1        Prem:   0        SAB:   1  TOP:          0       Ectopic:  0        Living: 1 ---------------------------------------------------------------------- Fetal Evaluation (Fetus A)  Num Of Fetuses:     2  Fetal Heart         133  Rate(bpm):  Cardiac Activity:   Observed  Fetal Lie:          Maternal left side  Presentation:       Cephalic  Placenta:           Posterior, above cervical os  Amniotic Fluid  AFI FV:      Subjectively within normal limits ---------------------------------------------------------------------- Biometry (Fetus A)  BPD:      80.4  mm     G. Age:  32w 2d         42  %    CI:  73.1   %    70 - 86                                                          FL/HC:      20.7   %    19.1 - 21.3  HC:      298.9  mm     G. Age:  33w 1d         35  %    HC/AC:      1.14        0.96 - 1.17  AC:       262   mm     G. Age:  30w 2d          7  %    FL/BPD:     77.0   %    71 - 87  FL:       61.9  mm     G. Age:  32w 0d         33  %    FL/AC:      23.6   %    20 - 24  HUM:      55.3  mm     G. Age:  32w 1d         50  %  CER:        43  mm     G. Age:  37w 0d       > 95  %  LV:        4.5  mm  Est. FW:    1743  gm    3 lb 13 oz      38  %     FW Discordancy         6  % ---------------------------------------------------------------------- Gestational Age (Fetus A)  LMP:           33w 2d        Date:  06/28/17                 EDD:   04/04/18  U/S Today:     32w 0d                                        EDD:   04/13/18  Best:          32w 2d     Det. By:  Marcella Dubs         EDD:   04/11/18                                      (09/04/17) ---------------------------------------------------------------------- Anatomy (Fetus A)  Cranium:               Appears normal         Aortic Arch:            Previously seen  Cavum:                 Appears normal         Ductal Arch:  Previously seen  Ventricles:            Appears normal         Diaphragm:              Previously seen  Choroid Plexus:        Previously seen        Stomach:                Appears normal, left                                                                        sided  Cerebellum:            Appears normal         Abdomen:                Appears normal  Posterior Fossa:       Appears normal         Abdominal Wall:         Previously seen  Nuchal Fold:           Previously seen        Cord Vessels:           Appears normal (3                                                                        vessel cord)  Face:                  Orbits and profile     Kidneys:                Appear normal                          previously seen  Lips:                  Appears normal         Bladder:                Appears normal  Thoracic:              Appears normal         Spine:                  Previously seen  Heart:                 Appears normal         Upper Extremities:      Previously seen                         (4CH, axis, and situs  RVOT:                  Appears normal         Lower Extremities:      Previously seen  LVOT:                  Appears normal  Other:  Female gender previously seen. ---------------------------------------------------------------------- Fetal Evaluation (Fetus B)  Num Of Fetuses:     2  Cardiac Activity:   Observed  Fetal Lie:          Maternal right side  Presentation:       Cephalic  Placenta:           Posterior, above cervical os  Amniotic Fluid  AFI FV:      Subjectively within normal limits                              Largest Pocket(cm)                              6.1 ---------------------------------------------------------------------- Biometry (Fetus B)  BPD:      79.6  mm     G. Age:  32w 0d         32  %    CI:        69.11   %    70 - 86                                                          FL/HC:      20.8   %    19.1 - 21.3  HC:      305.8  mm     G. Age:  34w 0d         60  %    HC/AC:      1.14        0.96 - 1.17  AC:      268.1  mm     G. Age:  30w 6d         15  %    FL/BPD:     79.8   %    71 - 87  FL:       63.5  mm     G. Age:  32w 6d         53  %    FL/AC:      23.7   %    20 - 24  HUM:      55.6  mm     G. Age:  32w 3d         54  %  CER:      41.2  mm     G. Age:  35w 1d         85  %  LV:        4.7  mm  Est. FW:    1862  gm      4 lb 2 oz     49  %     FW Discordancy      0 \ 6 % ---------------------------------------------------------------------- Gestational Age (Fetus B)  LMP:           33w 2d        Date:  06/28/17                 EDD:   04/04/18  U/S Today:     32w 3d                                        EDD:   04/10/18  Best:          32w 2d     Det. ByMarcella Dubs         EDD:   04/11/18                                      (09/04/17) ---------------------------------------------------------------------- Anatomy (Fetus B)  Cranium:               Appears normal         Aortic Arch:            Appears normal  Cavum:                 Appears normal         Ductal Arch:            Appears normal  Ventricles:            Appears normal         Diaphragm:              Previously seen  Choroid Plexus:        Previously seen        Stomach:                Appears normal, left                                                                        sided  Cerebellum:            Appears normal         Abdomen:                Appears normal  Posterior Fossa:       Appears normal         Abdominal Wall:         Previously seen  Nuchal Fold:           Previously seen        Cord Vessels:           Appears normal (3                                                                        vessel cord)  Face:                  Appears normal         Kidneys:                Previously seen                         (  orbits and profile)  Lips:                  Appears normal         Bladder:                Appears normal  Thoracic:              Appears normal         Spine:                  Previously seen  Heart:                 Appears normal         Upper Extremities:      Previously seen                         (4CH, axis, and situs  RVOT:                  Appears normal         Lower Extremities:      Previously seen  LVOT:                  Appears normal  Other:  Female gender previously seen. ---------------------------------------------------------------------- Cervix Uterus Adnexa  Cervix  Not visualized (advanced GA >29wks) ---------------------------------------------------------------------- Impression  Monochorionic/diamniotic twin gestation at 32+2 weeks here  for TTTS surveillance and growth evaluation  Normal fetal cardiac activity in both twins  Normal fetal movement from both  twins  Normal amniotic fluid in both gestational sacs, with MVPs of  4.5 cm for A and 6.1cm for B  Normal bladders and stomachs visualized in both twins  Twin A:  Presentation iscephalic  Interval review of fetal anatomy was normal  Appropriate interval growth with EFW at the 38th percentile  Twin B:  Presentation is cephalic  Interval review of fetal anatomy was normal  Appropriate interval growth with EFW at the 49th percentile  Dicordance is 6% ---------------------------------------------------------------------- Recommendations  Repeat scan for TTTS surveillance in 2 weeks ----------------------------------------------------------------------                 Charlsie Merles, MD Electronically Signed Final Report   02/16/2018 11:47 am ----------------------------------------------------------------------  [place] Mfm Ob Limited  Result Date: 03/02/2018 ----------------------------------------------------------------------  OBSTETRICS REPORT                      (Signed Final 03/02/2018 10:55 am) ---------------------------------------------------------------------- Patient Info  ID #:       [id number]                          D.O.B.:  1991-09-17 (26 yrs)  Name:       Courtney [name]                 Visit Date: 03/02/2018 09:55 am ---------------------------------------------------------------------- Performed By  Performed By:     Hurman Horn          Referred By:      Manual Meier MD  Attending:  Charlsie Merles MD         Location:         [facility] ---------------------------------------------------------------------- Orders   #  Description                                 Code   1  [place] MFM OB LIMITED                           [phone]   2  [place] MFM FETAL BPP WO NON STRESS              76819.01   3  [place] MFM FETAL BPP WO NST ADDL                45409.8      GESTATION  ----------------------------------------------------------------------   #   Ordered By               Order #        Accession #    Episode #   1  Ledon Snare              [id number]      [id number]     [id number]   2  MARK NEWMAN              [id number]      [id number]     [id number]   3  MARK NEWMAN              [id number]      [id number]     [id number]  ---------------------------------------------------------------------- Indications   [redacted] weeks gestation of pregnancy                Z3A.52   Twin pregnancy, mono/di, third trimester       O30.033   Hypertension - Chronic/Pre-existing            O10.019   Poor obstetric history: Previous fetal growth  O09.299   restriction (FGR)  ---------------------------------------------------------------------- OB History  Gravidity:    3         Term:   1        Prem:   0        SAB:   1  TOP:          0       Ectopic:  0        Living: 1 ---------------------------------------------------------------------- Fetal Evaluation (Fetus A)  Num Of Fetuses:     2  Fetal Heart         135  Rate(bpm):  Cardiac Activity:   Observed  Fetal Lie:          Maternal left side  Presentation:       Cephalic  Placenta:           Posterior, above cervical os  Amniotic Fluid  AFI FV:      Subjectively within normal limits                              Largest Pocket(cm)                              3.89 ---------------------------------------------------------------------- Biophysical Evaluation (Fetus A)  Amniotic F.V:   Within normal limits  F. Tone:        Observed  F. Movement:    Observed                   Score:          8/8  F. Breathing:   Observed ---------------------------------------------------------------------- Gestational Age (Fetus A)  LMP:           35w 2d        Date:  06/28/17                 EDD:   04/04/18  Best:          34w 2d     Det. ByMarcella Dubs         EDD:   04/11/18                                      (09/04/17) ---------------------------------------------------------------------- Fetal Evaluation (Fetus B)  Num Of Fetuses:     2   Fetal Heart         146  Rate(bpm):  Cardiac Activity:   Observed  Fetal Lie:          Upper Fetus  Presentation:       Transverse, head to maternal right  Placenta:           Posterior, above cervical os  P. Cord Insertion:  Visualized, central  Membrane Desc:      Dividing Membrane seen - Monochorionic  Amniotic Fluid  AFI FV:      Subjectively within normal limits                              Largest Pocket(cm)                              3.73 ---------------------------------------------------------------------- Biophysical Evaluation (Fetus B)  Amniotic F.V:   Within normal limits       F. Tone:        Observed  F. Movement:    Observed                   Score:          8/8  F. Breathing:   Observed ---------------------------------------------------------------------- Gestational Age (Fetus B)  LMP:           35w 2d        Date:  06/28/17                 EDD:   04/04/18  Best:          34w 2d     Det. ByMarcella Dubs         EDD:   04/11/18                                      (09/04/17) ---------------------------------------------------------------------- Impression  Monochorionic/diamniotic twin gestation at 34+2 weeks with  Sunrise here for TTTS surveillance and BPP  Normal fetal cardiac activity in both twins  Normal fetal movement from both twins  Normal amniotic fluid in both gestational sacs, with MVPs of  3.9 cm for A and 3.7 cm for B  Normal bladders and stomachs visualized in  both twins  BPP 8/8 for both twins ---------------------------------------------------------------------- Recommendations  Continue weekly BPPs along with TTTS surveillance ----------------------------------------------------------------------                 Charlsie Merles, MD Electronically Signed Final Report   03/02/2018 10:55 am ----------------------------------------------------------------------  [place] Mfm Ua Cord Doppler  Result Date: 03/16/2018 ----------------------------------------------------------------------   OBSTETRICS REPORT                       (Signed Final 03/16/2018 11:20 pm) ---------------------------------------------------------------------- Patient Info  ID #:       [id number]                          D.O.B.:  December 08, 1990 (26 yrs)  Name:       Courtney [name]                 Visit Date: 03/16/2018 10:11 am ---------------------------------------------------------------------- Performed By  Performed By:     Earley Brooke     Ref. Address:      [facility]                                                              [facility]Gyn Clinic                                                              [address]                                                              [place], [place]                                                              [address]  Attending:        Particia Nearing MD       Location:          [facility]  Referred By:      [facility]                    [facility] ---------------------------------------------------------------------- Orders   #  Description                                 Code   1  [place] MFM OB FOLLOW UP                         [id number]   2  [place] MFM OB FOLLOW UP ADDL GEST               [id number]   3  [place] MFM FETAL BPP WO NON STRESS              76819.01   4  [place] MFM FETAL BPP WO NST ADDL                96045.4      GESTATION   5  [place] MFM UA CORD DOPPLER                      76820.02   6  [place] MFM UA DOPPLER ADDL GEST RE              76820.03      EVAL  ----------------------------------------------------------------------   #  Ordered By               Order #        Accession #    Episode #   1  Ledon Snare              [id number]      [id number]     [id number]   2  BRIAN BROST              [id number]      [id number]     [id number]   3  MARK NEWMAN              [id number]      [id number]     [id number]   4  MARK NEWMAN               [id number]      [id number]     [id number]   5  MARTHA DECKER            [id number]      [id number]     [id number]   6  MARTHA DECKER            [id number]      [id number]     [id number]  ---------------------------------------------------------------------- Indications   [redacted] weeks gestation of pregnancy                Z3A.36   Twin pregnancy, mono/di, third trimester       O30.033   Hypertension - Chronic/Pre-existing            O10.019   Poor obstetric history: Previous fetal growth  O09.299   restriction (FGR)   Small for gestational age fetus affecting      O36.5990   management of mother  ---------------------------------------------------------------------- OB History  Gravidity:    3         Term:   1        Prem:   0         SAB:   1  TOP:  0       Ectopic:  0        Living: 1 ---------------------------------------------------------------------- Fetal Evaluation (Fetus A)  Num Of Fetuses:     2  Fetal Heart         123  Rate(bpm):  Cardiac Activity:   Observed  Fetal Lie:          Maternal left side  Presentation:       Cephalic  Placenta:           Posterior, above cervical os  P. Cord Insertion:  Previously Visualized  Membrane Desc:      Dividing Membrane seen - Monochorionic  Amniotic Fluid  AFI FV:      Subjectively within normal limits                              Largest Pocket(cm)                              5.8 ---------------------------------------------------------------------- Biophysical Evaluation (Fetus A)  Amniotic F.V:   Within normal limits       F. Tone:         Observed  F. Movement:    Observed                   Score:           8/8  F. Breathing:   Observed ---------------------------------------------------------------------- Biometry (Fetus A)  BPD:      85.7  mm     G. Age:  34w 4d         13  %    CI:         74.65  %    70 - 86                                                          FL/HC:       20.9  %    20.1 - 22.1  HC:      314.8  mm     G. Age:  35w 2d          7  %     HC/AC:       1.05       0.93 - 1.11  AC:      301.2  mm     G. Age:  34w 1d          8  %    FL/BPD:      76.9  %    71 - 87  FL:       65.9  mm     G. Age:  34w 0d          4  %    FL/AC:       21.9  %    20 - 24  Est. FW:    2377   gm     5 lb 4 oz     17  %     FW Discordancy         2   % ---------------------------------------------------------------------- Gestational Age (Fetus A)  LMP:  37w 2d        Date:  06/28/17                 EDD:    04/04/18  U/S Today:     34w 4d                                        EDD:    04/23/18  Best:          36w 3d     Det. ByMarcella Dubs         EDD:    04/10/18                                      (09/04/17) ---------------------------------------------------------------------- Anatomy (Fetus A)  Cranium:               Appears normal         Aortic Arch:            Previously seen  Cavum:                 Previously seen        Ductal Arch:            Previously seen  Ventricles:            Previously seen        Diaphragm:              Appears normal  Choroid Plexus:        Previously seen        Stomach:                Appears normal, left                                                                        sided  Cerebellum:            Previously seen        Abdomen:                Appears normal  Posterior Fossa:       Previously seen        Abdominal Wall:         Previously seen  Nuchal Fold:           Previously seen        Cord Vessels:           Previously seen  Face:                  Orbits and profile     Kidneys:                Appear normal                         previously seen  Lips:                  Previously seen        Bladder:  Appears normal  Thoracic:              Appears normal         Spine:                  Previously seen  Heart:                 Appears normal         Upper Extremities:      Previously seen                         (4CH, axis, and situs  RVOT:                  Previously seen        Lower  Extremities:      Previously seen  LVOT:                  Previously seen  Other:  Female gender previously seen. ---------------------------------------------------------------------- Doppler - Fetal Vessels (Fetus A)  Umbilical Artery   S/D     %tile                                      PSV   ADFV    RDFV                                                   (cm/s)  1.97        23                                    47.51      No       No ---------------------------------------------------------------------- Fetal Evaluation (Fetus B)  Num Of Fetuses:     2  Fetal Heart         121  Rate(bpm):  Cardiac Activity:   Observed  Fetal Lie:          Maternal right side  Presentation:       Cephalic  Placenta:           Posterior, above cervical os  P. Cord Insertion:  Previously Visualized  Membrane Desc:      Dividing Membrane seen - Monochorionic  Amniotic Fluid  AFI FV:      Subjectively within normal limits                              Largest Pocket(cm)                              8 ---------------------------------------------------------------------- Biophysical Evaluation (Fetus B)  Amniotic F.V:   Within normal limits       F. Tone:         Observed  F. Movement:    Observed                   Score:           8/8  F. Breathing:   Observed ---------------------------------------------------------------------- Biometry (Fetus B)  BPD:  83.6  mm     G. Age:  33w 4d          3  %    CI:         67.53  %    70 - 86                                                          FL/HC:       20.8  %    20.1 - 22.1  HC:      325.6  mm     G. Age:  36w 6d         33  %    HC/AC:       1.09       0.93 - 1.11  AC:      299.5  mm     G. Age:  34w 0d          6  %    FL/BPD:      81.0  %    71 - 87  FL:       67.7  mm     G. Age:  34w 5d         12  %    FL/AC:       22.6  %    20 - 24  Est. FW:    2432   gm     5 lb 6 oz     20  %     FW Discordancy      0 \ 2  %  ---------------------------------------------------------------------- Gestational Age (Fetus B)  LMP:           37w 2d        Date:  06/28/17                 EDD:    04/04/18  U/S Today:     34w 6d                                        EDD:    04/21/18  Best:          36w 3d     Det. By:  Marcella Dubs         EDD:    04/10/18                                      (09/04/17) ---------------------------------------------------------------------- Anatomy (Fetus B)  Cranium:               Appears normal         Aortic Arch:            Previously seen  Cavum:                 Previously seen        Ductal Arch:            Previously seen  Ventricles:            Previously seen        Diaphragm:  Appears normal  Choroid Plexus:        Previously seen        Stomach:                Appears normal, left                                                                        sided  Cerebellum:            Previously seen        Abdomen:                Appears normal  Posterior Fossa:       Previously seen        Abdominal Wall:         Previously seen  Nuchal Fold:           Previously seen        Cord Vessels:           Previously seen  Face:                  Orbits and profile     Kidneys:                Previously seen                         previously seen  Lips:                  Previously seen        Bladder:                Appears normal  Thoracic:              Appears normal         Spine:                  Previously seen  Heart:                 Previously seen        Upper Extremities:      Previously seen  RVOT:                  Previously seen        Lower Extremities:      Previously seen  LVOT:                  Previously seen  Other:  Female gender previously seen. ---------------------------------------------------------------------- Doppler - Fetal Vessels (Fetus B)  Umbilical Artery   S/D     %tile                                      PSV   ADFV    RDFV                                                    (cm/s)  2.27  43                                    47.49      No       No ---------------------------------------------------------------------- Cervix Uterus Adnexa  Cervix  Not visualized (advanced GA >29wks) ---------------------------------------------------------------------- Impression  Monochorionic/diamniotic twin pregnancy at 36+3 weeks  Normal interval anatomy x 2; anatomic survey complete x 2  Normal amniotic fluid volume x 2  Twin A: Cephalic presentation  EFW at the 17th %tile; AC at the 8th %tile  BPP 8/8  UA dopplers were normal for this GA  Twin B: Cephalic presentation  EFW at the 20th %tile; AC at the 6th %tile  BPP 8/8  UA dopplers were normal for this GA ---------------------------------------------------------------------- Recommendations  Deliver by 37 weeks ----------------------------------------------------------------------                 Particia Nearing, MD Electronically Signed Final Report   03/16/2018 11:20 pm ----------------------------------------------------------------------  [place] Mfm Ua Doppler Addl Gest Re Eval  Result Date: 03/16/2018 ----------------------------------------------------------------------  OBSTETRICS REPORT                       (Signed Final 03/16/2018 11:20 pm) ---------------------------------------------------------------------- Patient Info  ID #:       [id number]                          D.O.B.:  [birth year] (26 yrs)  Name:       Courtney [name]                 Visit Date: 03/16/2018 10:11 am ---------------------------------------------------------------------- Performed By  Performed By:     Earley Brooke     Ref. Address:      [facility]                                                              OB[facility]Gyn Clinic                                                              [address]                                                               [place], [place]  [address]  Attending:        Particia Nearing MD       Location:          [facility]  Referred By:      [facility]                    [facility]                    Healthcare ---------------------------------------------------------------------- Orders   #  Description                                 Code   1  [place] MFM OB FOLLOW UP                         [phone]   2  [place] MFM OB FOLLOW UP ADDL GEST               76816.02   3  [place] MFM FETAL BPP WO NON STRESS              76819.01   4  [place] MFM FETAL BPP WO NST ADDL                76819.1      GESTATION   5  [place] MFM UA CORD DOPPLER                      76820.02   6  [place] MFM UA DOPPLER ADDL GEST RE              76820.03      EVAL  ----------------------------------------------------------------------   #  Ordered By               Order #        Accession #    Episode #   1  Ledon Snare              [id number]      [id number]     [id number]   2  BRIAN BROST              [id number]      [id number]     [id number]   3  MARK NEWMAN              [id number]      [id number]     [id number]   4  MARK NEWMAN              [id number]      [id number]     [id number]   5  MARTHA DECKER            [id number]      [id number]     [id number]   6  MARTHA DECKER            [id number]      [id number]     [id number]  ---------------------------------------------------------------------- Indications   [redacted] weeks gestation of pregnancy                Z3A.36   Twin pregnancy, mono/di, third trimester       O30.033   Hypertension - Chronic/Pre-existing            O10.019  Poor obstetric history: Previous fetal growth  O09.299   restriction (FGR)   Small for gestational age fetus affecting      O59.5990   management of mother  ---------------------------------------------------------------------- OB History  Gravidity:    3         Term:   1        Prem:   0         SAB:   1  TOP:          0       Ectopic:   0        Living: 1 ---------------------------------------------------------------------- Fetal Evaluation (Fetus A)  Num Of Fetuses:     2  Fetal Heart         123  Rate(bpm):  Cardiac Activity:   Observed  Fetal Lie:          Maternal left side  Presentation:       Cephalic  Placenta:           Posterior, above cervical os  P. Cord Insertion:  Previously Visualized  Membrane Desc:      Dividing Membrane seen - Monochorionic  Amniotic Fluid  AFI FV:      Subjectively within normal limits                              Largest Pocket(cm)                              5.8 ---------------------------------------------------------------------- Biophysical Evaluation (Fetus A)  Amniotic F.V:   Within normal limits       F. Tone:         Observed  F. Movement:    Observed                   Score:           8/8  F. Breathing:   Observed ---------------------------------------------------------------------- Biometry (Fetus A)  BPD:      85.7  mm     G. Age:  34w 4d         13  %    CI:         74.65  %    70 - 86                                                          FL/HC:       20.9  %    20.1 - 22.1  HC:      314.8  mm     G. Age:  35w 2d          7  %    HC/AC:       1.05       0.93 - 1.11  AC:      301.2  mm     G. Age:  34w 1d          8  %    FL/BPD:      76.9  %    71 - 87  FL:       65.9  mm     G. Age:  34w 62d  4  %    FL/AC:       21.9  %    20 - 24  Est. FW:    2377   gm     5 lb 4 oz     17  %     FW Discordancy         2   % ---------------------------------------------------------------------- Gestational Age (Fetus A)  LMP:           37w 2d        Date:  06/28/17                 EDD:    04/04/18  U/S Today:     34w 4d                                        EDD:    04/23/18  Best:          36w 3d     Det. ByMarcella Dubs         EDD:    04/10/18                                      (09/04/17) ---------------------------------------------------------------------- Anatomy (Fetus A)  Cranium:                Appears normal         Aortic Arch:            Previously seen  Cavum:                 Previously seen        Ductal Arch:            Previously seen  Ventricles:            Previously seen        Diaphragm:              Appears normal  Choroid Plexus:        Previously seen        Stomach:                Appears normal, left                                                                        sided  Cerebellum:            Previously seen        Abdomen:                Appears normal  Posterior Fossa:       Previously seen        Abdominal Wall:         Previously seen  Nuchal Fold:           Previously seen        Cord Vessels:           Previously seen  Face:  Orbits and profile     Kidneys:                Appear normal                         previously seen  Lips:                  Previously seen        Bladder:                Appears normal  Thoracic:              Appears normal         Spine:                  Previously seen  Heart:                 Appears normal         Upper Extremities:      Previously seen                         (4CH, axis, and situs  RVOT:                  Previously seen        Lower Extremities:      Previously seen  LVOT:                  Previously seen  Other:  Female gender previously seen. ---------------------------------------------------------------------- Doppler - Fetal Vessels (Fetus A)  Umbilical Artery   S/D     %tile                                      PSV   ADFV    RDFV                                                   (cm/s)  1.97        23                                    47.51      No       No ---------------------------------------------------------------------- Fetal Evaluation (Fetus B)  Num Of Fetuses:     2  Fetal Heart         121  Rate(bpm):  Cardiac Activity:   Observed  Fetal Lie:          Maternal right side  Presentation:       Cephalic  Placenta:           Posterior, above cervical os  P. Cord Insertion:  Previously Visualized   Membrane Desc:      Dividing Membrane seen - Monochorionic  Amniotic Fluid  AFI FV:      Subjectively within normal limits                              Largest Pocket(cm)  8 ---------------------------------------------------------------------- Biophysical Evaluation (Fetus B)  Amniotic F.V:   Within normal limits       F. Tone:         Observed  F. Movement:    Observed                   Score:           8/8  F. Breathing:   Observed ---------------------------------------------------------------------- Biometry (Fetus B)  BPD:      83.6  mm     G. Age:  33w 4d          3  %    CI:         67.53  %    70 - 86                                                          FL/HC:       20.8  %    20.1 - 22.1  HC:      325.6  mm     G. Age:  36w 6d         33  %    HC/AC:       1.09       0.93 - 1.11  AC:      299.5  mm     G. Age:  34w 0d          6  %    FL/BPD:      81.0  %    71 - 87  FL:       67.7  mm     G. Age:  34w 5d         12  %    FL/AC:       22.6  %    20 - 24  Est. FW:    2432   gm     5 lb 6 oz     20  %     FW Discordancy      0 \ 2  % ---------------------------------------------------------------------- Gestational Age (Fetus B)  LMP:           37w 2d        Date:  06/28/17                 EDD:    04/04/18  U/S Today:     34w 6d                                        EDD:    04/21/18  Best:          36w 3d     Det. ByMarcella Dubs         EDD:    04/10/18                                      (09/04/17) ---------------------------------------------------------------------- Anatomy (Fetus B)  Cranium:               Appears normal         Aortic  Arch:            Previously seen  Cavum:                 Previously seen        Ductal Arch:            Previously seen  Ventricles:            Previously seen        Diaphragm:              Appears normal  Choroid Plexus:        Previously seen        Stomach:                Appears normal, left                                                                         sided  Cerebellum:            Previously seen        Abdomen:                Appears normal  Posterior Fossa:       Previously seen        Abdominal Wall:         Previously seen  Nuchal Fold:           Previously seen        Cord Vessels:           Previously seen  Face:                  Orbits and profile     Kidneys:                Previously seen                         previously seen  Lips:                  Previously seen        Bladder:                Appears normal  Thoracic:              Appears normal         Spine:                  Previously seen  Heart:                 Previously seen        Upper Extremities:      Previously seen  RVOT:                  Previously seen        Lower Extremities:      Previously seen  LVOT:                  Previously seen  Other:  Female gender previously seen. ---------------------------------------------------------------------- Doppler - Fetal Vessels (Fetus B)  Umbilical Artery   S/D     %tile  PSV   ADFV    RDFV                                                   (cm/s)  2.27        43                                    47.49      No       No ---------------------------------------------------------------------- Cervix Uterus Adnexa  Cervix  Not visualized (advanced GA >29wks) ---------------------------------------------------------------------- Impression  Monochorionic/diamniotic twin pregnancy at 36+3 weeks  Normal interval anatomy x 2; anatomic survey complete x 2  Normal amniotic fluid volume x 2  Twin A: Cephalic presentation  EFW at the 17th %tile; AC at the 8th %tile  BPP 8/8  UA dopplers were normal for this GA  Twin B: Cephalic presentation  EFW at the 20th %tile; AC at the 6th %tile  BPP 8/8  UA dopplers were normal for this GA ---------------------------------------------------------------------- Recommendations  Deliver by 37 weeks  ----------------------------------------------------------------------                 Particia Nearing, MD Electronically Signed Final Report   03/16/2018 11:20 pm ----------------------------------------------------------------------  [place] Mfm Fetal Bpp Wo Nst Addl Gestation  Result Date: 03/16/2018 ----------------------------------------------------------------------  OBSTETRICS REPORT                       (Signed Final 03/16/2018 11:20 pm) ---------------------------------------------------------------------- Patient Info  ID #:       [id number]                          D.O.B.:  1990-12-31 (26 yrs)  Name:       Courtney [name]                 Visit Date: 03/16/2018 10:11 am ---------------------------------------------------------------------- Performed By  Performed By:     Earley Brooke     Ref. Address:      [facility]                                                              [facility]                                                              [address]                                                              [place], [place]                                                              [address]  Attending:        Particia Nearing MD       Location:          [facility]  Referred By:      [facility] for                    [facility]                    Healthcare ---------------------------------------------------------------------- Orders   #  Description                                 Code   1  [place] MFM OB FOLLOW UP                         60454.09   2  [place] MFM OB FOLLOW UP ADDL GEST               76816.02   3  [place] MFM FETAL BPP WO NON STRESS              76819.01   4  [place] MFM FETAL BPP WO NST ADDL                81191.4      GESTATION   5  [place] MFM UA CORD DOPPLER                      76820.02   6  [place] MFM UA DOPPLER ADDL GEST RE              76820.03      EVAL   ----------------------------------------------------------------------   #  Ordered By               Order #        Accession #    Episode #   1  Ledon Snare              [id number]      [id number]     [id number]   2  BRIAN BROST              [id number]      [id number]     [id number]   3  MARK NEWMAN              [id number]      [id number]     [id number]   4  MARK NEWMAN              [id number]  [id number]     [id number]   5  MARTHA DECKER            [id number]      [id number]     [id number]   6  MARTHA DECKER            [id number]      [id number]     [id number]  ---------------------------------------------------------------------- Indications   [redacted] weeks gestation of pregnancy                Z3A.36   Twin pregnancy, mono/di, third trimester       O30.033   Hypertension - Chronic/Pre-existing            O10.019   Poor obstetric history: Previous fetal growth  O09.299   restriction (FGR)   Small for gestational age fetus affecting      O36.5990   management of mother  ---------------------------------------------------------------------- OB History  Gravidity:    3         Term:   1        Prem:   0         SAB:   1  TOP:          0       Ectopic:  0        Living: 1 ---------------------------------------------------------------------- Fetal Evaluation (Fetus A)  Num Of Fetuses:     2  Fetal Heart         123  Rate(bpm):  Cardiac Activity:   Observed  Fetal Lie:          Maternal left side  Presentation:       Cephalic  Placenta:           Posterior, above cervical os  P. Cord Insertion:  Previously Visualized  Membrane Desc:      Dividing Membrane seen - Monochorionic  Amniotic Fluid  AFI FV:      Subjectively within normal limits                              Largest Pocket(cm)                              5.8 ---------------------------------------------------------------------- Biophysical Evaluation (Fetus A)  Amniotic F.V:   Within normal limits       F. Tone:         Observed  F. Movement:    Observed                    Score:           8/8  F. Breathing:   Observed ---------------------------------------------------------------------- Biometry (Fetus A)  BPD:      85.7  mm     G. Age:  34w 4d         13  %    CI:         74.65  %    70 - 86                                                          FL/HC:       20.9  %    20.1 -  22.1  HC:      314.8  mm     G. Age:  35w 2d          7  %    HC/AC:       1.05       0.93 - 1.11  AC:      301.2  mm     G. Age:  34w 1d          8  %    FL/BPD:      76.9  %    71 - 87  FL:       65.9  mm     G. Age:  34w 0d          4  %    FL/AC:       21.9  %    20 - 24  Est. FW:    2377   gm     5 lb 4 oz     17  %     FW Discordancy         2   % ---------------------------------------------------------------------- Gestational Age (Fetus A)  LMP:           37w 2d        Date:  06/28/17                 EDD:    04/04/18  U/S Today:     34w 4d                                        EDD:    04/23/18  Best:          36w 3d     Det. ByMarcella Dubs         EDD:    04/10/18                                      (09/04/17) ---------------------------------------------------------------------- Anatomy (Fetus A)  Cranium:               Appears normal         Aortic Arch:            Previously seen  Cavum:                 Previously seen        Ductal Arch:            Previously seen  Ventricles:            Previously seen        Diaphragm:              Appears normal  Choroid Plexus:        Previously seen        Stomach:                Appears normal, left  sided  Cerebellum:            Previously seen        Abdomen:                Appears normal  Posterior Fossa:       Previously seen        Abdominal Wall:         Previously seen  Nuchal Fold:           Previously seen        Cord Vessels:           Previously seen  Face:                  Orbits and profile     Kidneys:                Appear normal                          previously seen  Lips:                  Previously seen        Bladder:                Appears normal  Thoracic:              Appears normal         Spine:                  Previously seen  Heart:                 Appears normal         Upper Extremities:      Previously seen                         (4CH, axis, and situs  RVOT:                  Previously seen        Lower Extremities:      Previously seen  LVOT:                  Previously seen  Other:  Female gender previously seen. ---------------------------------------------------------------------- Doppler - Fetal Vessels (Fetus A)  Umbilical Artery   S/D     %tile                                      PSV   ADFV    RDFV                                                   (cm/s)  1.97        23                                    47.51      No       No ---------------------------------------------------------------------- Fetal Evaluation (Fetus B)  Num Of Fetuses:     2  Fetal Heart         121  Rate(bpm):  Cardiac Activity:   Observed  Fetal Lie:          Maternal right side  Presentation:       Cephalic  Placenta:           Posterior, above cervical os  P. Cord Insertion:  Previously Visualized  Membrane Desc:      Dividing Membrane seen - Monochorionic  Amniotic Fluid  AFI FV:      Subjectively within normal limits                              Largest Pocket(cm)                              8 ---------------------------------------------------------------------- Biophysical Evaluation (Fetus B)  Amniotic F.V:   Within normal limits       F. Tone:         Observed  F. Movement:    Observed                   Score:           8/8  F. Breathing:   Observed ---------------------------------------------------------------------- Biometry (Fetus B)  BPD:      83.6  mm     G. Age:  33w 4d          3  %    CI:         67.53  %    70 - 86                                                          FL/HC:       20.8  %    20.1 - 22.1  HC:      325.6  mm     G. Age:  36w 6d          33  %    HC/AC:       1.09       0.93 - 1.11  AC:      299.5  mm     G. Age:  34w 0d          6  %    FL/BPD:      81.0  %    71 - 87  FL:       67.7  mm     G. Age:  34w 5d         12  %    FL/AC:       22.6  %    20 - 24  Est. FW:    2432   gm     5 lb 6 oz     20  %     FW Discordancy      0 \ 2  % ---------------------------------------------------------------------- Gestational Age (Fetus B)  LMP:           37w 2d        Date:  06/28/17                 EDD:    04/04/18  U/S Today:     34w 6d  EDD:    04/21/18  Best:          36w 3d     Det. ByMarcella Dubs         EDD:    04/10/18                                      (09/04/17) ---------------------------------------------------------------------- Anatomy (Fetus B)  Cranium:               Appears normal         Aortic Arch:            Previously seen  Cavum:                 Previously seen        Ductal Arch:            Previously seen  Ventricles:            Previously seen        Diaphragm:              Appears normal  Choroid Plexus:        Previously seen        Stomach:                Appears normal, left                                                                        sided  Cerebellum:            Previously seen        Abdomen:                Appears normal  Posterior Fossa:       Previously seen        Abdominal Wall:         Previously seen  Nuchal Fold:           Previously seen        Cord Vessels:           Previously seen  Face:                  Orbits and profile     Kidneys:                Previously seen                         previously seen  Lips:                  Previously seen        Bladder:                Appears normal  Thoracic:              Appears normal         Spine:                  Previously seen  Heart:                 Previously seen  Upper Extremities:      Previously seen  RVOT:                  Previously seen        Lower Extremities:      Previously seen  LVOT:                   Previously seen  Other:  Female gender previously seen. ---------------------------------------------------------------------- Doppler - Fetal Vessels (Fetus B)  Umbilical Artery   S/D     %tile                                      PSV   ADFV    RDFV                                                   (cm/s)  2.27        43                                    47.49      No       No ---------------------------------------------------------------------- Cervix Uterus Adnexa  Cervix  Not visualized (advanced GA >29wks) ---------------------------------------------------------------------- Impression  Monochorionic/diamniotic twin pregnancy at 36+3 weeks  Normal interval anatomy x 2; anatomic survey complete x 2  Normal amniotic fluid volume x 2  Twin A: Cephalic presentation  EFW at the 17th %tile; AC at the 8th %tile  BPP 8/8  UA dopplers were normal for this GA  Twin B: Cephalic presentation  EFW at the 20th %tile; AC at the 6th %tile  BPP 8/8  UA dopplers were normal for this GA ---------------------------------------------------------------------- Recommendations  Deliver by 37 weeks ----------------------------------------------------------------------                 Particia Nearing, MD Electronically Signed Final Report   03/16/2018 11:20 pm ----------------------------------------------------------------------  [place] Mfm Fetal Bpp Wo Nst Addl Gestation  Result Date: 03/09/2018 ----------------------------------------------------------------------  OBSTETRICS REPORT                      (Signed Final 03/09/2018 03:21 pm) ---------------------------------------------------------------------- Patient Info  ID #:       [id number]                          D.O.B.:  1991-04-29 (26 yrs)  Name:       Courtney [name]                 Visit Date: 03/09/2018 02:23 pm ---------------------------------------------------------------------- Performed By  Performed By:     Lenise Arena        Referred By:      Manual Meier MD  Attending:        Charlsie Merles MD  Location:         [facility] ---------------------------------------------------------------------- Orders   #  Description                                 Code   1  [place] MFM FETAL BPP WO NON STRESS              76819.01   2  [place] MFM FETAL BPP WO NST ADDL                [address].6      GESTATION  ----------------------------------------------------------------------   #  Ordered By               Order #        Accession #    Episode #   1  MARK NEWMAN              [id number]      [id number]     [id number]   2  MARK NEWMAN              [id number]      [id number]     [id number]  ---------------------------------------------------------------------- Indications   [redacted] weeks gestation of pregnancy                Z3A.35   Twin pregnancy, mono/di, third trimester       O30.033   Hypertension - Chronic/Pre-existing            O10.019   Poor obstetric history: Previous fetal growth  O09.299   restriction (FGR)  ---------------------------------------------------------------------- OB History  Gravidity:    3         Term:   1        Prem:   0        SAB:   1  TOP:          0       Ectopic:  0        Living: 1 ---------------------------------------------------------------------- Fetal Evaluation (Fetus A)  Num Of Fetuses:     2  Fetal Heart         132  Rate(bpm):  Cardiac Activity:   Observed  Fetal Lie:          Maternal left side  Presentation:       Cephalic  Placenta:           Posterior, above cervical os  Membrane Desc:      Dividing Membrane seen - Monochorionic  Amniotic Fluid  AFI FV:      Subjectively within normal limits                              Largest Pocket(cm)                              4.37 ---------------------------------------------------------------------- Biophysical Evaluation (Fetus A)  Amniotic F.V:   Within normal limits       F. Tone:        Observed  F. Movement:    Observed                    Score:          8/8  F. Breathing:   Observed ---------------------------------------------------------------------- Gestational Age (Fetus A)  LMP:  36w 2d        Date:  06/28/17                 EDD:   04/04/18  Best:          Consuello Closs 2d     Det. ByMarcella Dubs         EDD:   04/11/18                                      (09/04/17) ---------------------------------------------------------------------- Anatomy (Fetus A)  Thoracic:              Appears normal         Kidneys:                Appear normal  Stomach:               Appears normal, left   Bladder:                Appears normal                         sided  Abdomen:               Appears normal ---------------------------------------------------------------------- Fetal Evaluation (Fetus B)  Num Of Fetuses:     2  Fetal Heart         140  Rate(bpm):  Cardiac Activity:   Observed  Fetal Lie:          Upper Fetus  Presentation:       Transverse, head to maternal right  Placenta:           Posterior, above cervical os  Membrane Desc:      Dividing Membrane seen - Monochorionic  Amniotic Fluid  AFI FV:      Subjectively within normal limits                              Largest Pocket(cm)                              4.65 ---------------------------------------------------------------------- Biophysical Evaluation (Fetus B)  Amniotic F.V:   Within normal limits       F. Tone:        Observed  F. Movement:    Observed                   Score:          8/8  F. Breathing:   Observed ---------------------------------------------------------------------- Gestational Age (Fetus B)  LMP:           36w 2d        Date:  06/28/17                 EDD:   04/04/18  Best:          Consuello Closs 2d     Det. ByMarcella Dubs         EDD:   04/11/18                                      (09/04/17) ---------------------------------------------------------------------- Anatomy (Fetus B)  Thoracic:  Appears normal         Kidneys:                Appear  normal  Stomach:               Appears normal, left   Bladder:                Appears normal                         sided  Abdomen:               Appears normal ---------------------------------------------------------------------- Cervix Uterus Adnexa  Cervix  Not visualized (advanced GA >29wks) ---------------------------------------------------------------------- Impression  Monochorionic/diamniotic twin gestation at 35+2 weeks with  [facility] here for TTTS surveillance and BPP  Normal fetal cardiac activity in both twins  Normal fetal movement from both twins  Normal amniotic fluid in both gestational sacs, with MVPs of  3.9 cm for A and 3.7 cm for B  Normal bladders and stomachs visualized in both twins  BPP 8/8 for both twins ---------------------------------------------------------------------- Recommendations  Continue weekly BPPs along with TTTS surveillance. Repeat  growth scan next week ----------------------------------------------------------------------                 Charlsie Merles, MD Electronically Signed Final Report   03/09/2018 03:21 pm ----------------------------------------------------------------------  [place] Mfm Fetal Bpp Wo Nst Addl Gestation  Result Date: 03/02/2018 ----------------------------------------------------------------------  OBSTETRICS REPORT                      (Signed Final 03/02/2018 10:55 am) ---------------------------------------------------------------------- Patient Info  ID #:       [id number]                          D.O.B.:  1991-08-13 (26 yrs)  Name:       Courtney [name]                 Visit Date: 03/02/2018 09:55 am ---------------------------------------------------------------------- Performed By  Performed By:     Hurman Horn          Referred By:      Manual Meier MD  Attending:        Charlsie Merles MD         Location:         [facility]  ---------------------------------------------------------------------- Orders   #  Description                                 Code   1  [place] MFM OB LIMITED                           62130.86   2  [place] MFM FETAL BPP WO NON STRESS              57846.96   3  [place] MFM FETAL BPP WO NST ADDL  [address].6      GESTATION  ----------------------------------------------------------------------   #  Ordered By               Order #        Accession #    Episode #   1  Ledon Snare              [id number]      [id number]     [id number]   2  MARK NEWMAN              [id number]      [id number]     [id number]   3  MARK NEWMAN              [id number]      [id number]     [id number]  ---------------------------------------------------------------------- Indications   [redacted] weeks gestation of pregnancy                Z3A.44   Twin pregnancy, mono/di, third trimester       O30.033   Hypertension - Chronic/Pre-existing            O10.019   Poor obstetric history: Previous fetal growth  O09.299   restriction (FGR)  ---------------------------------------------------------------------- OB History  Gravidity:    3         Term:   1        Prem:   0        SAB:   1  TOP:          0       Ectopic:  0        Living: 1 ---------------------------------------------------------------------- Fetal Evaluation (Fetus A)  Num Of Fetuses:     2  Fetal Heart         135  Rate(bpm):  Cardiac Activity:   Observed  Fetal Lie:          Maternal left side  Presentation:       Cephalic  Placenta:           Posterior, above cervical os  Amniotic Fluid  AFI FV:      Subjectively within normal limits                              Largest Pocket(cm)                              3.89 ---------------------------------------------------------------------- Biophysical Evaluation (Fetus A)  Amniotic F.V:   Within normal limits       F. Tone:        Observed  F. Movement:    Observed                   Score:          8/8  F. Breathing:   Observed  ---------------------------------------------------------------------- Gestational Age (Fetus A)  LMP:           35w 2d        Date:  06/28/17                 EDD:   04/04/18  Best:          34w 2d     Det. ByMarcella Dubs         EDD:   04/11/18                                      (  09/04/17) ---------------------------------------------------------------------- Fetal Evaluation (Fetus B)  Num Of Fetuses:     2  Fetal Heart         146  Rate(bpm):  Cardiac Activity:   Observed  Fetal Lie:          Upper Fetus  Presentation:       Transverse, head to maternal right  Placenta:           Posterior, above cervical os  P. Cord Insertion:  Visualized, central  Membrane Desc:      Dividing Membrane seen - Monochorionic  Amniotic Fluid  AFI FV:      Subjectively within normal limits                              Largest Pocket(cm)                              3.73 ---------------------------------------------------------------------- Biophysical Evaluation (Fetus B)  Amniotic F.V:   Within normal limits       F. Tone:        Observed  F. Movement:    Observed                   Score:          8/8  F. Breathing:   Observed ---------------------------------------------------------------------- Gestational Age (Fetus B)  LMP:           35w 2d        Date:  06/28/17                 EDD:   04/04/18  Best:          34w 2d     Det. ByMarcella Dubs         EDD:   04/11/18                                      (09/04/17) ---------------------------------------------------------------------- Impression  Monochorionic/diamniotic twin gestation at 34+2 weeks with  [facility] here for TTTS surveillance and BPP  Normal fetal cardiac activity in both twins  Normal fetal movement from both twins  Normal amniotic fluid in both gestational sacs, with MVPs of  3.9 cm for A and 3.7 cm for B  Normal bladders and stomachs visualized in both twins  BPP 8/8 for both twins ----------------------------------------------------------------------  Recommendations  Continue weekly BPPs along with TTTS surveillance ----------------------------------------------------------------------                 Charlsie Merles, MD Electronically Signed Final Report   03/02/2018 10:55 am ----------------------------------------------------------------------  [place] Mfm Fetal Bpp Wo Nst Addl Gestation  Result Date: 02/23/2018 ----------------------------------------------------------------------  OBSTETRICS REPORT                      (Signed Final 02/23/2018 03:34 pm) ---------------------------------------------------------------------- Patient Info  ID #:       [id number]                          D.O.B.:  02-23-1991 (26 yrs)  Name:       Courtney [name]                 Visit Date: 02/23/2018 02:26 pm ---------------------------------------------------------------------- Performed By  Performed By:  Devin Vics             Referred By:      Clyde Lundborg MD  Attending:        Charlsie Merles MD         Location:         [facility] ---------------------------------------------------------------------- Orders   #  Description                                 Code   1  [place] MFM FETAL BPP WO NON STRESS              76819.01   2  [place] MFM FETAL BPP WO NST ADDL                16109.6      GESTATION  ----------------------------------------------------------------------   #  Ordered By               Order #        Accession #    Episode #   1  MARK NEWMAN              [id number]      [id number]     [id number]   2  MARK NEWMAN              [id number]      [id number]     [id number]  ---------------------------------------------------------------------- Indications   [redacted] weeks gestation of pregnancy                Z3A.33   Twin pregnancy, mono/di, third trimester       O30.033   Hypertension - Chronic/Pre-existing            O10.019   Poor obstetric history: Previous fetal growth  O09.299   restriction (FGR)   ---------------------------------------------------------------------- OB History  Gravidity:    3         Term:   1        Prem:   0        SAB:   1  TOP:          0       Ectopic:  0        Living: 1 ---------------------------------------------------------------------- Fetal Evaluation (Fetus A)  Num Of Fetuses:     2  Fetal Heart         137  Rate(bpm):  Cardiac Activity:   Observed  Fetal Lie:          Maternal left side  Presentation:       Cephalic  Membrane Desc:      Dividing Membrane seen  Amniotic Fluid  AFI FV:      Subjectively within normal limits                              Largest Pocket(cm)                              6.94 ---------------------------------------------------------------------- Biophysical Evaluation (  Fetus A)  Amniotic F.V:   Within normal limits       F. Tone:        Observed  F. Movement:    Observed                   Score:          8/8  F. Breathing:   Observed ---------------------------------------------------------------------- Gestational Age (Fetus A)  LMP:           34w 2d        Date:  06/28/17                 EDD:   04/04/18  Best:          33w 2d     Det. ByMarcella Dubs         EDD:   04/11/18                                      (09/04/17) ---------------------------------------------------------------------- Anatomy (Fetus A)  Stomach:               Appears normal, left   Bladder:                Appears normal                         sided  Kidneys:               Appear normal ---------------------------------------------------------------------- Fetal Evaluation (Fetus B)  Num Of Fetuses:     2  Fetal Heart         136  Rate(bpm):  Cardiac Activity:   Observed  Fetal Lie:          Upper Fetus  Presentation:       Transverse, head to maternal right  Membrane Desc:      Dividing Membrane seen  Amniotic Fluid  AFI FV:      Subjectively within normal limits                              Largest Pocket(cm)                              6.38  ---------------------------------------------------------------------- Biophysical Evaluation (Fetus B)  Amniotic F.V:   Within normal limits       F. Tone:        Observed  F. Movement:    Observed                   Score:          8/8  F. Breathing:   Observed ---------------------------------------------------------------------- Gestational Age (Fetus B)  LMP:           34w 2d        Date:  06/28/17                 EDD:   04/04/18  Best:          33w 2d     Det. ByMarcella Dubs         EDD:   04/11/18                                      (  09/04/17) ---------------------------------------------------------------------- Anatomy (Fetus B)  Stomach:               Appears normal, left   Bladder:                Appears normal                         sided ---------------------------------------------------------------------- Cervix Uterus Adnexa  Cervix  Not visualized (advanced GA >29wks) ---------------------------------------------------------------------- Impression  Monochorionic/diamniotic twin gestation at 33+2 weeks with  [facility] here for TTTS surveillance and BPP  Normal fetal cardiac activity in both twins  Normal fetal movement from both twins  Normal amniotic fluid in both gestational sacs, with MVPs of  6.9 cm for A and 6.4 cm for B  Normal bladders and stomachs visualized in both twins  BPP 8/8 for both twins ---------------------------------------------------------------------- Recommendations  Continue weekly BPPs along with TTTS surveillance ----------------------------------------------------------------------                 Charlsie Merles, MD Electronically Signed Final Report   02/23/2018 03:34 pm ----------------------------------------------------------------------  [place] Mfm Fetal Bpp Wo Nst Addl Gestation  Result Date: 02/18/2018 ----------------------------------------------------------------------  OBSTETRICS REPORT                      (Signed Final 02/18/2018 08:24 am)  ---------------------------------------------------------------------- Patient Info  ID #:       [id number]                          D.O.B.:  1991-03-14 (26 yrs)  Name:       Courtney [name]                 Visit Date: 02/17/2018 04:19 pm ---------------------------------------------------------------------- Performed By  Performed By:     Emeline Darling BS,      Referred By:      Manual Meier MD  Attending:        Charlsie Merles MD         Location:         [facility] ---------------------------------------------------------------------- Orders   #  Description                                 Code   1  [place] MFM FETAL BPP WO NON STRESS              76819.01   2  [place] MFM FETAL BPP WO NST ADDL                24580.9      GESTATION  ----------------------------------------------------------------------   #  Ordered By               Order #        Accession #    Episode #   1  Scheryl Darter             [id number]      [id number]     [id number]   2  JAMES ARNOLD  [id number]      [id number]     [id number]  ---------------------------------------------------------------------- Indications   [redacted] weeks gestation of pregnancy                Z3A.32   Twin pregnancy, mono/di, third trimester       O30.033   Hypertension - Chronic/Pre-existing            O10.019   Poor obstetric history: Previous fetal growth  O09.299   restriction (FGR)  ---------------------------------------------------------------------- OB History  Gravidity:    3         Term:   1        Prem:   0        SAB:   1  TOP:          0       Ectopic:  0        Living: 1 ---------------------------------------------------------------------- Fetal Evaluation (Fetus A)  Num Of Fetuses:     2  Fetal Heart         126  Rate(bpm):  Cardiac Activity:   Observed  Fetal Lie:          Maternal left side  Presentation:       Cephalic  Placenta:           Posterior, above cervical os  Membrane Desc:       Dividing Membrane seen  Amniotic Fluid  AFI FV:      Subjectively within normal limits                              Largest Pocket(cm)                              6.8 ---------------------------------------------------------------------- Biophysical Evaluation (Fetus A)  Amniotic F.V:   Pocket => 2 cm two         F. Tone:        Observed                  planes  F. Movement:    Observed                   Score:          8/8  F. Breathing:   Observed ---------------------------------------------------------------------- Gestational Age (Fetus A)  LMP:           33w 3d        Date:  06/28/17                 EDD:   04/04/18  Best:          Armida Sans 3d     Det. ByMarcella Dubs         EDD:   04/11/18                                      (09/04/17) ---------------------------------------------------------------------- Fetal Evaluation (Fetus B)  Num Of Fetuses:     2  Fetal Heart         142  Rate(bpm):  Cardiac Activity:   Observed  Fetal Lie:          Maternal right side  Presentation:       Transverse, head to maternal right  Placenta:  Posterior, above cervical os  Membrane Desc:      Dividing Membrane seen  Amniotic Fluid  AFI FV:      Subjectively within normal limits                              Largest Pocket(cm)                              4.5 ---------------------------------------------------------------------- Biophysical Evaluation (Fetus B)  Amniotic F.V:   Pocket => 2 cm two         F. Tone:        Observed                  planes  F. Movement:    Observed                   Score:          8/8  F. Breathing:   Observed ---------------------------------------------------------------------- Gestational Age (Fetus B)  LMP:           33w 3d        Date:  06/28/17                 EDD:   04/04/18  Best:          Armida Sans 3d     Det. ByMarcella Dubs         EDD:   04/11/18                                      (09/04/17) ---------------------------------------------------------------------- Impression   Monochorionic/diamniotic twin gestation at 32+3 weeks with  [facility] here for TTTS surveillance  Normal fetal cardiac activity in both twins  Normal fetal movement from both twins  Normal amniotic fluid in both gestational sacs, with MVPs of  6.8 cm for A and 4.5 cm for B  Normal bladders and stomachs visualized in both twins  BPP 8/8 for both twins ---------------------------------------------------------------------- Recommendations  Start weekly BPP due to [facility], along with TTTS surveillance ----------------------------------------------------------------------                 Charlsie Merles, MD Electronically Signed Final Report   02/18/2018 08:24 am ----------------------------------------------------------------------   Assessment and Plan:  Pregnancy: G3P1011 at [redacted]w[redacted]d  1. Monochorionic diamniotic twin pregnancy, antepartum Normal antenatal testing yesterday. IOL scheduled at 37 weeks, orders signed and held.  2. Chronic hypertension during pregnancy, antepartum Stable BP  3. Supervision of high risk pregnancy, antepartum Pelvic cultures done - Strep Gp B NAA - Cervicovaginal ancillary only  Preterm labor symptoms and general obstetric precautions including but not limited to vaginal bleeding, contractions, leaking of fluid and fetal movement were reviewed in detail with the patient. Please refer to After Visit Summary for other counseling recommendations.  Return in about 12 days (around 03/29/2018), or BP check, for Postpartum check in about 5 weeks.  Future Appointments  Date Time Provider Department Center  03/24/2018  8:55 AM Reva Bores, MD [facility]  03/24/2018 11:15 AM [facility]    Jaynie Collins, MD

## 2018-03-18 LAB — CERVICOVAGINAL ANCILLARY ONLY
Chlamydia: NEGATIVE
Neisseria Gonorrhea: NEGATIVE

## 2018-03-19 LAB — STREP GP B NAA: Strep Gp B NAA: NEGATIVE

## 2018-03-20 ENCOUNTER — Inpatient Hospital Stay (HOSPITAL_COMMUNITY): Payer: Medicaid Other | Admitting: Anesthesiology

## 2018-03-20 ENCOUNTER — Other Ambulatory Visit: Payer: Self-pay

## 2018-03-20 ENCOUNTER — Inpatient Hospital Stay (HOSPITAL_COMMUNITY)
Admission: RE | Admit: 2018-03-20 | Discharge: 2018-03-22 | DRG: 806 | Disposition: A | Discharge status: Home / Self Care | Payer: Medicaid Other | Attending: Obstetrics & Gynecology | Admitting: Obstetrics & Gynecology

## 2018-03-20 ENCOUNTER — Encounter (HOSPITAL_COMMUNITY): Payer: Self-pay

## 2018-03-20 DIAGNOSIS — O30033 Twin pregnancy, monochorionic/diamniotic, third trimester: Secondary | ICD-10-CM | POA: Diagnosis present

## 2018-03-20 DIAGNOSIS — O321XX0 Maternal care for breech presentation, not applicable or unspecified: Secondary | ICD-10-CM | POA: Diagnosis present

## 2018-03-20 DIAGNOSIS — O1002 Pre-existing essential hypertension complicating childbirth: Secondary | ICD-10-CM | POA: Diagnosis present

## 2018-03-20 DIAGNOSIS — Z3A37 37 weeks gestation of pregnancy: Secondary | ICD-10-CM

## 2018-03-20 LAB — COMPREHENSIVE METABOLIC PANEL
ALBUMIN: 3.3 g/dL — AB (ref 3.5–5.0)
ALK PHOS: 168 U/L — AB (ref 38–126)
ALT: 10 U/L — ABNORMAL LOW (ref 14–54)
AST: 23 U/L (ref 15–41)
Anion gap: 10 (ref 5–15)
BILIRUBIN TOTAL: 0.9 mg/dL (ref 0.3–1.2)
BUN: 7 mg/dL (ref 6–20)
CALCIUM: 9 mg/dL (ref 8.9–10.3)
CO2: 17 mmol/L — ABNORMAL LOW (ref 22–32)
Chloride: 107 mmol/L (ref 101–111)
Creatinine, Ser: 0.52 mg/dL (ref 0.44–1.00)
GFR calc Af Amer: >60 mL/min (ref >60)
Glucose, Bld: 87 mg/dL (ref 65–99)
Potassium: 4.1 mmol/L (ref 3.5–5.1)
Sodium: 134 mmol/L — ABNORMAL LOW (ref 135–145)
TOTAL PROTEIN: 7.1 g/dL (ref 6.5–8.1)

## 2018-03-20 LAB — CBC
HEMATOCRIT: 37.6 % (ref 36.0–46.0)
HEMOGLOBIN: 12.8 g/dL (ref 12.0–15.0)
MCH: 29.4 pg (ref 26.0–34.0)
MCHC: 34 g/dL (ref 30.0–36.0)
MCV: 86.2 fL (ref 78.0–100.0)
Platelets: 200 10*3/uL (ref 150–400)
RBC: 4.36 MIL/uL (ref 3.87–5.11)
RDW: 14 % (ref 11.5–15.5)
WBC: 7.1 10*3/uL (ref 4.0–10.5)

## 2018-03-20 LAB — TYPE AND SCREEN
ABO/RH(D): O POS
Antibody Screen: NEGATIVE

## 2018-03-20 LAB — POCT FERN TEST: POCT Fern Test: NEGATIVE

## 2018-03-20 MED ORDER — FLEET ENEMA 7-19 GM/118ML RE ENEM: 1.0000 | ENEMA | Freq: Every day | RECTAL | Status: DC | PRN

## 2018-03-20 MED ORDER — ACETAMINOPHEN 325 MG PO TABS: 650.0000 mg | ORAL_TABLET | ORAL | Status: DC | PRN

## 2018-03-20 MED ORDER — FENTANYL CITRATE (PF) 100 MCG/2ML IJ SOLN
50.0000 ug | INTRAMUSCULAR | Status: DC | PRN
Start: 2018-03-20 — End: 2018-03-20

## 2018-03-20 MED ORDER — DIPHENHYDRAMINE HCL 25 MG PO CAPS: 25.0000 mg | ORAL_CAPSULE | Freq: Four times a day (QID) | ORAL | Status: DC | PRN

## 2018-03-20 MED ORDER — LACTATED RINGERS IV SOLN
500.0000 mL | Freq: Once | INTRAVENOUS | Status: AC
  Administered 2018-03-20: 500 mL via INTRAVENOUS

## 2018-03-20 MED ORDER — EPHEDRINE 5 MG/ML INJ
10.0000 mg | INTRAVENOUS | Status: DC | PRN
  Filled 2018-03-20: qty 2

## 2018-03-20 MED ORDER — SIMETHICONE 80 MG PO CHEW: 80.0000 mg | CHEWABLE_TABLET | ORAL | Status: DC | PRN

## 2018-03-20 MED ORDER — DIPHENHYDRAMINE HCL 50 MG/ML IJ SOLN: 12.5000 mg | INTRAMUSCULAR | Status: DC | PRN

## 2018-03-20 MED ORDER — IBUPROFEN 600 MG PO TABS
600.0000 mg | ORAL_TABLET | Freq: Four times a day (QID) | ORAL | Status: DC
  Administered 2018-03-21 – 2018-03-22 (×7): 600 mg via ORAL
  Filled 2018-03-20 (×7): qty 1

## 2018-03-20 MED ORDER — TERBUTALINE SULFATE 1 MG/ML IJ SOLN
0.2500 mg | Freq: Once | INTRAMUSCULAR | Status: DC | PRN
  Filled 2018-03-20: qty 1

## 2018-03-20 MED ORDER — PRENATAL MULTIVITAMIN CH
1.0000 | ORAL_TABLET | Freq: Every day | ORAL | Status: DC
  Administered 2018-03-21 – 2018-03-22 (×2): 1 via ORAL
  Filled 2018-03-20 (×2): qty 1

## 2018-03-20 MED ORDER — PHENYLEPHRINE 40 MCG/ML (10ML) SYRINGE FOR IV PUSH (FOR BLOOD PRESSURE SUPPORT)
80.0000 ug | PREFILLED_SYRINGE | INTRAVENOUS | Status: DC | PRN
  Filled 2018-03-20: qty 5

## 2018-03-20 MED ORDER — OXYCODONE-ACETAMINOPHEN 5-325 MG PO TABS: 1.0000 | ORAL_TABLET | ORAL | Status: DC | PRN

## 2018-03-20 MED ORDER — TETANUS-DIPHTH-ACELL PERTUSSIS 5-2.5-18.5 LF-MCG/0.5 IM SUSP: 0.5000 mL | Freq: Once | INTRAMUSCULAR | Status: DC

## 2018-03-20 MED ORDER — OXYTOCIN 40 UNITS IN LACTATED RINGERS INFUSION - SIMPLE MED: 2.5000 [IU]/h | INTRAVENOUS | Status: DC

## 2018-03-20 MED ORDER — ONDANSETRON HCL 4 MG/2ML IJ SOLN: 4.0000 mg | Freq: Four times a day (QID) | INTRAMUSCULAR | Status: DC | PRN

## 2018-03-20 MED ORDER — ONDANSETRON HCL 4 MG/2ML IJ SOLN: 4.0000 mg | INTRAMUSCULAR | Status: DC | PRN

## 2018-03-20 MED ORDER — LIDOCAINE HCL (PF) 1 % IJ SOLN
INTRAMUSCULAR | Status: DC | PRN
  Administered 2018-03-20 (×2): 4 mL via EPIDURAL

## 2018-03-20 MED ORDER — FENTANYL 2.5 MCG/ML BUPIVACAINE 1/10 % EPIDURAL INFUSION (WH - ANES): 14.0000 mL/h | INTRAMUSCULAR | Status: DC | PRN

## 2018-03-20 MED ORDER — COCONUT OIL OIL: 1.0000 "application " | TOPICAL_OIL | Status: DC | PRN

## 2018-03-20 MED ORDER — SENNOSIDES-DOCUSATE SODIUM 8.6-50 MG PO TABS
2.0000 | ORAL_TABLET | ORAL | Status: DC
  Administered 2018-03-21 (×2): 2 via ORAL
  Filled 2018-03-20 (×2): qty 2

## 2018-03-20 MED ORDER — PHENYLEPHRINE 40 MCG/ML (10ML) SYRINGE FOR IV PUSH (FOR BLOOD PRESSURE SUPPORT)
80.0000 ug | PREFILLED_SYRINGE | INTRAVENOUS | Status: DC | PRN
  Filled 2018-03-20: qty 10
  Filled 2018-03-20: qty 5

## 2018-03-20 MED ORDER — OXYTOCIN 40 UNITS IN LACTATED RINGERS INFUSION - SIMPLE MED
1.0000 m[IU]/min | INTRAVENOUS | Status: DC
  Administered 2018-03-20: 2 m[IU]/min via INTRAVENOUS
  Filled 2018-03-20: qty 1000

## 2018-03-20 MED ORDER — LACTATED RINGERS IV SOLN
INTRAVENOUS | Status: DC
  Administered 2018-03-20 (×2): via INTRAVENOUS

## 2018-03-20 MED ORDER — HYDROXYZINE HCL 50 MG PO TABS
50.0000 mg | ORAL_TABLET | Freq: Four times a day (QID) | ORAL | Status: DC | PRN
  Filled 2018-03-20: qty 1

## 2018-03-20 MED ORDER — OXYCODONE-ACETAMINOPHEN 5-325 MG PO TABS: 2.0000 | ORAL_TABLET | ORAL | Status: DC | PRN

## 2018-03-20 MED ORDER — ZOLPIDEM TARTRATE 5 MG PO TABS: 5.0000 mg | ORAL_TABLET | Freq: Every evening | ORAL | Status: DC | PRN

## 2018-03-20 MED ORDER — OXYTOCIN BOLUS FROM INFUSION
500.0000 mL | Freq: Once | INTRAVENOUS | Status: AC
  Administered 2018-03-20: 500 mL via INTRAVENOUS

## 2018-03-20 MED ORDER — FENTANYL 2.5 MCG/ML BUPIVACAINE 1/10 % EPIDURAL INFUSION (WH - ANES)
14.0000 mL/h | INTRAMUSCULAR | Status: DC | PRN
  Administered 2018-03-20: 14 mL/h via EPIDURAL
  Filled 2018-03-20: qty 100

## 2018-03-20 MED ORDER — LACTATED RINGERS IV SOLN: 500.0000 mL | INTRAVENOUS | Status: DC | PRN

## 2018-03-20 MED ORDER — BENZOCAINE-MENTHOL 20-0.5 % EX AERO: 1.0000 "application " | INHALATION_SPRAY | CUTANEOUS | Status: DC | PRN

## 2018-03-20 MED ORDER — ONDANSETRON HCL 4 MG PO TABS: 4.0000 mg | ORAL_TABLET | ORAL | Status: DC | PRN

## 2018-03-20 MED ORDER — WITCH HAZEL-GLYCERIN EX PADS: 1.0000 "application " | MEDICATED_PAD | CUTANEOUS | Status: DC | PRN

## 2018-03-20 MED ORDER — LIDOCAINE HCL (PF) 1 % IJ SOLN
30.0000 mL | INTRAMUSCULAR | Status: DC | PRN
  Filled 2018-03-20: qty 30

## 2018-03-20 MED ORDER — DIBUCAINE 1 % RE OINT: 1.0000 "application " | TOPICAL_OINTMENT | RECTAL | Status: DC | PRN

## 2018-03-20 MED ORDER — SOD CITRATE-CITRIC ACID 500-334 MG/5ML PO SOLN: 30.0000 mL | ORAL | Status: DC | PRN

## 2018-03-20 NOTE — MAU Note (Addendum)
Mono/di twins.  Pt for induction.  Asked by L&D to start in MAU.  [facility], no bleeding or leaking. 4cm when last checked.

## 2018-03-20 NOTE — Anesthesia Procedure Notes (Signed)
Epidural Patient location during procedure: OB Start time: 03/20/2018 5:43 PM End time: 03/20/2018 5:53 PM  Staffing Anesthesiologist: Lewie LoronGermeroth, Daquavion Catala, MD Performed: anesthesiologist   Preanesthetic Checklist Completed: patient identified, pre-op evaluation, timeout performed, IV checked, risks and benefits discussed and monitors and equipment checked  Epidural Patient position: sitting Prep: site prepped and draped and DuraPrep Patient monitoring: heart rate, continuous pulse ox and blood pressure Approach: midline Location: L3-L4 Injection technique: LOR air and LOR saline  Needle:  Needle type: Tuohy  Needle gauge: 17 G Needle length: 9 cm Needle insertion depth: 5 cm Catheter type: closed end flexible Catheter size: 19 Gauge Catheter at skin depth: 10 cm Test dose: negative  Assessment Sensory level: T8 Events: blood not aspirated, injection not painful, no injection resistance, negative IV test and no paresthesia  Additional Notes Reason for block:procedure for pain

## 2018-03-20 NOTE — Anesthesia Preprocedure Evaluation (Signed)
Anesthesia Evaluation  Patient identified by MRN, date of birth, ID band Patient awake    Reviewed: Allergy & Precautions, H&P , Patient's Chart, lab work & pertinent test results  Airway Mallampati: II  TM Distance: >3 FB Neck ROM: full    Dental  (+) Teeth Intact   Pulmonary    breath sounds clear to auscultation       Cardiovascular hypertension,  Rhythm:regular Rate:Normal     Neuro/Psych    GI/Hepatic   Endo/Other    Renal/GU      Musculoskeletal   Abdominal   Peds  Hematology   Anesthesia Other Findings       Reproductive/Obstetrics (+) Pregnancy                             Anesthesia Physical  Anesthesia Plan  ASA: II  Anesthesia Plan: Epidural   Post-op Pain Management:    Induction:   PONV Risk Score and Plan:   Airway Management Planned:   Additional Equipment:   Intra-op Plan:   Post-operative Plan:   Informed Consent: I have reviewed the patients History and Physical, chart, labs and discussed the procedure including the risks, benefits and alternatives for the proposed anesthesia with the patient or authorized representative who has indicated his/her understanding and acceptance.     Dental Advisory Given  Plan Discussed with:   Anesthesia Plan Comments: (Labs checked- platelets confirmed with RN in room. Fetal heart tracing, per RN, reported to be stable enough for sitting procedure. Discussed epidural, and patient consents to the procedure:  included risk of possible headache,backache, failed block, allergic reaction, and nerve injury. This patient was asked if she had any questions or concerns before the procedure started.)        Anesthesia Quick Evaluation  

## 2018-03-20 NOTE — Anesthesia Pain Management Evaluation Note (Signed)
  [facility] Pain Management Visit Note  Patient: Courtney RoyaltyBrittany Guallpa, 27 y.o., female  "Hello [facility] We have an anesthesia team available at all times to provide care throughout the hospital, including epidural management and anesthesia for C-section. I don't know your plan for the delivery whether it a natural birth, water birth, IV sedation, nitrous supplementation, doula or epidural, but we want to meet your pain goals."   1.Was your pain managed to your expectations on prior hospitalizations?   Yes   2.What is your expectation for pain management during this hospitalization?     Epidural  3.How can we help you reach that goal? Epidural when desired  Record the patient's initial score and the patient's pain goal.   Pain: 3  Pain Goal: 5 [facility] wants you to be able to say your pain was always managed very well.  [name] 03/20/2018

## 2018-03-20 NOTE — Progress Notes (Signed)
Labor Progress Note  Courtney [name] is a 27 y.o. G3P1011 at 3759w0d  admitted for induction of labor due to mono/di twins.  S: Comfortable with epidural.   O:  BP (!) 141/91   Pulse (!) 121   Temp 97.9 F (36.6 C) (Oral)   Resp 16   Ht 5\' 3"  (1.6 m)   Wt 70 kg (154 lb 4 oz)   LMP 06/28/2017 (Exact Date)   SpO2 100%   BMI 27.32 kg/m   No intake/output data recorded.  FHT A:  FHR: 120 bpm, variability: moderate,  accelerations:  Present,  decelerations:  Absent  FHT B:  FHR: 130 bpm, variability: moderate,  accelerations:  Present,  decelerations:  Absent UC:   regular, every 1-3 minutes SVE:   Dilation: 4 Effacement (%): 80 Station: -1 Exam by:: Courtney OuchStephanie Russell, Courtney [name]   Pitocin @ 2 mu/min  Labs: Lab Results  Component Value Date   WBC 7.1 03/20/2018   HGB 12.8 03/20/2018   HCT 37.6 03/20/2018   MCV 86.2 03/20/2018   PLT 200 03/20/2018    Assessment / Plan: 27 y.o. G3P1011 2259w0d in early labor Induction of labor due to multifetal gestation,  progressing well on pitocin  Labor: Progressing on Pitocin, will continue to increase then AROM Fetal Wellbeing:  Category I Pain Control:  Epidural Anticipated MOD:  NSVD  Expectant management   Caryl AdaJazma Syrita Dovel, DO OB Fellow Center for [facility], [facility]

## 2018-03-20 NOTE — MAU Note (Signed)
Pt was dancing and felt something come out.

## 2018-03-20 NOTE — H&P (Signed)
Obstetric History and Physical  Courtney [name] is a 27 y.o. G3P1011 with IUP at 5838w0d presenting for IOL for mono/di twins. Patient also with prenatal course significant for cHTN; not on medications. Patient states she has been having  none contractions, none vaginal bleeding, intact membranes, with active fetal movement.  No blurry vision, headaches or peripheral edema, and RUQ pain.   Prenatal Course Source of Care: [facility] Dating: By Brigid Re8wk US --->  Estimated Date of Delivery: 04/10/18 Pregnancy complications or risks: Patient Active Problem List   Diagnosis Date Noted  . Chronic hypertension during pregnancy, antepartum 09/22/2017  . Supervision of high risk pregnancy, antepartum 09/22/2017  . History of prior pregnancy with SGA newborn 09/22/2017  . Monochorionic diamniotic twin pregnancy, antepartum 09/22/2017   She plans to breastfeed She desires oral progesterone-only contraceptive for postpartum contraception.   Sono:    Twin A: @[redacted]w[redacted]d , CWD, normal anatomy, cephalic presentation, posterior placenta, 2377g, 17% EFW Twin B:  @[redacted]w[redacted]d , CWD, normal anatomy, cephalic presentation, posterior placenta, 2432g, 20% EFW  2% discordancy  Prenatal labs and studies: ABO, Rh: O/Positive/-- (12/05 1352) Antibody: Negative (12/05 1352) Rubella: 13.40 (12/05 1352) RPR: Non Reactive (03/29 0915)  HBsAg: Negative (12/05 1352)  HIV: Non Reactive (03/29 0915)  ZOX:WRUEAVWUGBS:Negative (05/30 1122) 2 hr Glucola normal Genetic screening normal Anatomy US normal  Prenatal Transfer Tool  Maternal Diabetes: No Genetic Screening: Normal Maternal Ultrasounds/Referrals: Normal Fetal Ultrasounds or other Referrals:  Referred to Materal Fetal Medicine  Maternal Substance Abuse:  No Significant Maternal Medications:  None Significant Maternal Lab Results: Lab values include: Group B Strep negative  Past Medical History:  Diagnosis Date  . Anemia   . Hypertension    not currently on meds  . Migraine   .  Trichomonal vaginitis during pregnancy     Past Surgical History:  Procedure Laterality Date  . NO PAST SURGERIES    . WISDOM TOOTH EXTRACTION      OB History  Gravida Para Term Preterm AB Living  3 1 1   1 1   SAB TAB Ectopic Multiple Live Births  1     0 1    # Outcome Date GA Lbr Len/2nd Weight Sex Delivery Anes PTL Lv  3 Current           2 Term 02/14/15 6289w1d 02:47 / 00:09 2.575 kg (5 lb 10.8 oz) F Vag-Spont EPI  LIV  1 SAB             Social History   Socioeconomic History  . Marital status: Married    Spouse name: Not on file  . Number of children: Not on file  . Years of education: Not on file  . Highest education level: Not on file  Occupational History  . Not on file  Social Needs  . Financial resource strain: Not on file  . Food insecurity:    Worry: Not on file    Inability: Not on file  . Transportation needs:    Medical: Not on file    Non-medical: Not on file  Tobacco Use  . Smoking status: Never Smoker  . Smokeless tobacco: Never Used  Substance and Sexual Activity  . Alcohol use: No  . Drug use: No  . Sexual activity: Yes  Lifestyle  . Physical activity:    Days per week: Not on file    Minutes per session: Not on file  . Stress: Not on file  Relationships  . Social connections:    Talks  on phone: Not on file    Gets together: Not on file    Attends religious service: Not on file    Active member of club or organization: Not on file    Attends meetings of clubs or organizations: Not on file    Relationship status: Not on file  Other Topics Concern  . Not on file  Social History Narrative   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    Married     Family History  Problem Relation Age of Onset  . Diabetes Mother   . Multiple sclerosis Mother   . Fibromyalgia Mother   . Kidney disease Father   . Hypertension Father   . Diabetes Maternal Uncle   . Diabetes Maternal Grandmother   . Cancer Paternal Grandfather        lung  . Autism Paternal Uncle         half-uncle ( husbands brother)   . Hypertension Maternal Grandfather   . Heart attack Maternal Grandfather   . Stroke Maternal Grandfather   . Hypertension Paternal Grandmother   . Stroke Paternal Grandmother   . Heart disease Paternal Grandmother     Medications Prior to Admission  Medication Sig Dispense Refill Last Dose  . PRENATAL 27-1 MG TABS Take 1 tablet by mouth daily. 30 each 12 Taking    No Known Allergies  Review of Systems: Negative except for what is mentioned in HPI.  Physical Exam: BP 128/88 (BP Location: Left Arm)   Pulse (!) 130   Temp 98.4 F (36.9 C) (Oral)   Resp 18   Ht 5\' 3"  (1.6 m)   Wt 70 kg (154 lb 4 oz)   LMP 06/28/2017 (Exact Date)   SpO2 99%   BMI 27.32 kg/m  CONSTITUTIONAL: Well-developed, well-nourished female in no acute distress.  HENT:  Normocephalic, atraumatic, External right and left ear normal. Oropharynx is clear and moist EYES: Conjunctivae and EOM are normal. Pupils are equal, round, and reactive to light. No scleral icterus.  NECK: Normal range of motion, supple, no masses SKIN: Skin is warm and dry. No rash noted. Not diaphoretic. No erythema. No pallor. NEUROLOGIC: Alert and oriented to person, place, and time. Normal reflexes, muscle tone coordination. No cranial nerve deficit noted. PSYCHIATRIC: Normal mood and affect. Normal behavior. Normal judgment and thought content. CARDIOVASCULAR: Normal heart rate noted, regular rhythm RESPIRATORY: Effort and breath sounds normal, no problems with respiration noted ABDOMEN: Soft, nontender, nondistended, gravid. MUSCULOSKELETAL: Normal range of motion. No edema and no tenderness. 2+ distal pulses.  Cervical Exam: Dilation: 4.5 Effacement (%): 80 Station: -1 Exam by:: Darlene  Twin A: FHT:  Baseline rate 130 bpm   Variability moderate  Accelerations present   Decelerations none Twin B: FHT:  Baseline rate 120 bpm   Variability moderate  Accelerations present   Decelerations  none Contractions: Irregular   Pertinent Labs/Studies:   Results for orders placed or performed during the hospital encounter of 03/20/18 (from the past 24 hour(s))  Fern Test     Status: None   Collection Time: 03/20/18  1:40 PM  Result Value Ref Range   POCT Fern Test Negative = intact amniotic membranes     Assessment : Briseis Aguilera is a 27 y.o. G3P1011 at [redacted]w[redacted]d being admitted for induction of labor due to mono/di twin pregnancy. Prenatal course complicated  cHTN.  Plan: Labor: Induction with Pitocin due to favorable cervix.  CHTN: BPs wnl. Monitor. Analgesia as needed. FWB: Reassuring fetal  heart tracing.   GBS negative Delivery plan: Hopeful for vaginal delivery   Caryl Ada, DO OB Fellow Faculty Practice, [facility] -  03/20/2018, 10:41 AM

## 2018-03-21 NOTE — Anesthesia Postprocedure Evaluation (Signed)
Anesthesia Post Note  Patient: Courtney [name]  Procedure(s) Performed: AN AD HOC LABOR EPIDURAL     Patient location during evaluation: Mother Baby Anesthesia Type: Epidural Level of consciousness: awake and alert Pain management: pain level controlled Vital Signs Assessment: post-procedure vital signs reviewed and stable Respiratory status: spontaneous breathing, nonlabored ventilation and respiratory function stable Cardiovascular status: stable Postop Assessment: no headache, no backache and epidural receding Anesthetic complications: no    Last Vitals:  Vitals:   03/21/18 0004 03/21/18 0330  BP: 125/84 115/75  Pulse: (!) 114 94  Resp: 18 18  Temp: 36.9 C 36.8 C  SpO2:  99%    Last Pain:  Vitals:   03/21/18 0532  TempSrc:   PainSc: 0-No pain   Pain Goal:                 EchoStarMERRITT,Gwynevere Lizana

## 2018-03-21 NOTE — Progress Notes (Signed)
Post Partum Day 1 Subjective: no complaints  Objective: Blood pressure 115/75, pulse 94, temperature 98.3 F (36.8 C), temperature source Oral, resp. rate 18, height 5\' 3"  (1.6 m), weight 154 lb 4 oz (70 kg), last menstrual period 06/28/2017, SpO2 99 %, unknown if currently breastfeeding.  Physical Exam:  General: alert, cooperative and no distress Lochia: appropriate Uterine Fundus: firm DVT Evaluation: No evidence of DVT seen on physical exam.  Recent Labs    03/20/18 1433  HGB 12.8  HCT 37.6    Assessment/Plan: Plan for discharge tomorrow   LOS: 1 day   Garnette Gunneraron B Thompson 03/21/2018, 7:10 AM

## 2018-03-21 NOTE — Lactation Note (Signed)
This note was copied from a baby's chart. Lactation Consultation Note  Patient Name: Courtney CapraBoyA [name] ZOXWR'UToday's Date: 03/21/2018   Spoke with twin baby's nurse.  Both babies are <6 lbs and 37 weeks. Babies 26 hrs old.  Both babies are becoming sleepy at the breast.  Mom is to supplement babies per LPTI guidelines.  Baby A and B will receive 10-20 ml EBM+/formula by bottle after breastfeeding.  Mom is pumping with double electric pump.   Lactation to follow up in am.   Judee ClaraSmith, Seraphine Gudiel E 03/21/2018, 11:10 PM

## 2018-03-21 NOTE — Lactation Note (Signed)
This note was copied from a baby's chart. Lactation Consultation Note  Patient Name: Courtney [name] ZHYQM'VToday's Date: 03/21/2018 Reason for consult: Initial assessment;Early term 37-38.6wks;Infant < 6lbs  P3 mother whose twin boys are now 334 hours old.  Mother breastfed her 26 year old for about 2 months and then pumped and bottle fed for a total of 9 months.  She hopes to exclusively breastfeed the twins.  Mother is doing STS when I arrived.  Discussed with her the meaning of  "Early Term Infant" and acknowledged their weights as being < 6 lbs.    I talked with mother about the need to awaken the twins every 3 hours to feed and explained techniques to help keep the infants awake at the breast.  Continue doing STS while awake and breast massage and hand expression before and after feedings.  Colostrum container provided for any EBM she may obtain and to feed it back to the babies.   Initiated the DEBP with instructions on use, assembly and cleaning of parts.  Mother will finger feed any colostrum she obtains immediately after pumping.  Mother will not be returning to work and has a DEBP for home use.  She will call as needed for assistance.  Her parents are in the room and helping to care for the babies.     Maternal Data Formula Feeding for Exclusion: No Has patient been taught Hand Expression?: Yes Does the patient have breastfeeding experience prior to this delivery?: Yes  Feeding Feeding Type: Breast Fed Length of feed: 0 min  LATCH Score Latch: Too sleepy or reluctant, no latch achieved, no sucking elicited.  Audible Swallowing: None  Type of Nipple: Flat  Comfort (Breast/Nipple): Soft / non-tender  Hold (Positioning): Assistance needed to correctly position infant at breast and maintain latch.  LATCH Score: 4  Interventions Interventions: Hand express  Lactation Tools Discussed/Used     Consult Status Consult Status: Follow-up Date: 03/22/18 Follow-up type:  In-patient    Jaeshaun Riva R Raylea Adcox 03/21/2018, 1:03 AM

## 2018-03-21 NOTE — Lactation Note (Signed)
This note was copied from a baby's chart. Lactation Consultation Note  Patient Name: [name] [name] UJWJX'BToday's Date: 03/21/2018 Reason for consult: Follow-up assessment;Early term 6137-38.6wks   Twins 4217 hours old.  Mother states she has been breastfeeding but now twins are becoming sleepy and having difficulty latching. Mother states she pumped approx 5 ml earlier today and feels comfortable hand expressing also. Earlier today due to sleepiness she requested formula supplementation for twins. Reviewed LPI volume guidelines and it is mother's choice to supplement with slow flow nipple. Encouraged puming and using hands on pumping. Mom has my # to call for assist w/next feeding.    Maternal Data    Feeding    LATCH Score                   Interventions    Lactation Tools Discussed/Used     Consult Status Consult Status: Follow-up Date: 03/22/18 Follow-up type: In-patient    Dahlia ByesBerkelhammer, Ruth [facility] 03/21/2018, 1:37 PM

## 2018-03-22 LAB — RPR: RPR: NONREACTIVE

## 2018-03-22 NOTE — Discharge Summary (Signed)
OB Discharge Summary     Patient Name: Courtney [name] DOB: 11-11-1990 MRN: [id number]  Date of admission: 03/20/2018 Delivering MD:    Courtney Courtney [name] [name] [[id number]][[id number]]  Courtney [name]   Courtney [name] [name] [[id number]][[id number]]  Courtney Courtney [name] [name]   Date of discharge: 03/22/2018  Admitting diagnosis: INDUCTION Intrauterine pregnancy: 9230w0d     Secondary diagnosis:  Active Problems:   Labor and delivery indication for care or intervention   SVD (spontaneous vaginal delivery)  Additional problems: Chronic Hypertension      Discharge diagnosis: Term Pregnancy Delivered and CHTN                                                                                                Post partum procedures:none  Augmentation: AROM and Pitocin  Complications: None  Hospital course:  Induction of Labor With Vaginal Delivery   27 [name].o. yo [id number] at 8730w0d was admitted to the hospital 03/20/2018 for induction of labor.  Indication for induction: Mono/Di twins .  Patient had an uncomplicated labor course as follows: Membrane Rupture Time/Date:    Courtney Courtney [name] [name] [[id number]][[id number]]  7:30 PM   Courtney Courtney [name] [name] [[id number]][[id number]]  8:24 PM ,   Courtney Courtney [name] [name] [[id number]][[id number]]  03/20/2018   Courtney Courtney [name] [name] [[id number]][[id number]]  03/20/2018   Intrapartum Procedures: Episiotomy:    Courtney Courtney [name] [name] [[id number]][[id number]]  None [1]   Courtney Courtney [name] [name] [[id number]][[id number]]  None [1]                                         Lacerations:     Courtney Courtney [name] [name] [[id number]][[id number]]  None [1]   Courtney Courtney [name] [name] [[id number]][[id number]]  None [1]  Patient had delivery of a Viable infant.  Information for the patient'[name] newborn:  Courtney Courtney [name] [name] [[id number]][[id number]]  Delivery Method: Vag-Spont Information for the patient'[name] newborn:  Courtney Courtney [name] [name] [[id number]][[id number]]  Delivery Method: Vag-Spont     Courtney Courtney [name] [name] [[id number]][[id number]]  03/20/2018   Courtney Courtney [name] [name] [[id number]][[id number]]  03/20/2018  Details of delivery can be found in  separate delivery note.  Patient had a routine postpartum course. Patient is discharged home 03/22/18.  Physical exam  Vitals:   03/21/18 0004 03/21/18 0330 03/21/18 1903 03/22/18 0510  BP: 125/84 115/75 131/84 (!) 128/97  Pulse: (!) 114 94 97 83  Resp: 18 18 18 16   Temp: 98.5 F (36.9 C) 98.3 F (36.8 C) 98.6 F (37 C) 97.9 F (36.6 C)  TempSrc: Oral Oral Oral Oral  SpO2:  99% 99% 100%  Weight:      Height:       General: alert, cooperative and no distress Lochia: appropriate Uterine Fundus: firm Incision: N/A DVT Evaluation: No evidence of DVT seen on physical exam. No cords or calf tenderness. No significant calf/ankle edema. Labs: Lab Results  Component Value Date   WBC 7.1 03/20/2018   HGB 12.8 03/20/2018   HCT 37.6 03/20/2018   MCV 86.2 03/20/2018   PLT 200 03/20/2018   CMP Latest  Ref Rng & Units 03/20/2018  Glucose 65 - 99 mg/dL 87  BUN 6 - 20 mg/dL 7  Creatinine 1.61 - 0.96 mg/dL 0.45  Sodium 409 - 811 mmol/L 134(L)  Potassium 3.5 - 5.1 mmol/L 4.1  Chloride 101 - 111 mmol/L 107  CO2 22 - 32 mmol/L 17(L)  Calcium 8.9 - 10.3 mg/dL 9.0  Total Protein 6.5 - 8.1 g/dL 7.1  Total Bilirubin 0.3 - 1.2 mg/dL 0.9  Alkaline Phos 38 - 126 U/L 168(H)  AST 15 - 41 U/L 23  ALT 14 - 54 U/L 10(L)    Discharge instruction: per After Visit Summary and "Baby and Me Booklet".  After visit meds:  Allergies as of 03/22/2018   No Known Allergies     Medication List    TAKE these medications   prenatal multivitamin Tabs tablet Take 1 tablet by mouth at bedtime.       Diet: routine diet  Activity: Advance as tolerated. Pelvic rest for 6 weeks.   Outpatient follow up:4 weeks  Follow up Appt: Future Appointments  Date Time Provider Department Center  03/29/2018  2:00 PM [facility] NURSE [facility]   Follow up Visit:No follow-ups on file.  Postpartum contraception: Progesterone only pills  Newborn Data:   Courtney [name] [[id number]]  Live born female   Birth Weight: 5 lb 4 oz (2380 g) APGAR: 2, 8  Newborn Delivery   Birth date/time:  03/20/2018 20:21:00 Delivery type:  Vaginal, Spontaneous      Courtney [name] [[id number]]  Live born female  Birth Weight: 5 lb 4.7 oz (2400 g) APGAR: 6, 9  Newborn Delivery   Birth date/time:  03/20/2018 20:25:00 Delivery type:  Vaginal, Spontaneous     Baby Feeding: Breast Disposition:rooming in   03/22/2018 Courtney [name], CNM

## 2018-03-22 NOTE — Lactation Note (Addendum)
This note was copied from a baby's chart. Lactation Consultation Note  Patient Name: Courtney [name]'NToday's Date: 03/22/2018 Reason for consult: Follow-up assessment;Infant < 6lbs   Twins 2336 hours old.  37 weeks. Baby B has been doing better at the breast and mother is supplementing afterwards w/formula. Baby A has been sleepier at the breast and only sustained latch for a few minutes. Changed diaper to wake baby and then bottle fed formula while infant was on his side. Baby took approx 18 ml. Used slow flow nipple. Mother has DEBP at home and has been pumping when able between feedings.  Returned to room to help w/ latching.  Baby sucking weak at the breast. Reviewed LPI feeding plan.  Applied #20NS and prefilled w/ colostrum. Baby sustained latch for approx 7 min. Recommend mother supplement w/ slow flow nipple bottle after feeding following LPI volume guidelines.    Maternal Data    Feeding Feeding Type: Breast Fed Length of feed: 3 min  LATCH Score Latch: Repeated attempts needed to sustain latch, nipple held in mouth throughout feeding, stimulation needed to elicit sucking reflex.  Audible Swallowing: None  Type of Nipple: Everted at rest and after stimulation  Comfort (Breast/Nipple): Soft / non-tender  Hold (Positioning): Assistance needed to correctly position infant at breast and maintain latch.  LATCH Score: 6  Interventions    Lactation Tools Discussed/Used     Consult Status Consult Status: Follow-up Date: 03/23/18 Follow-up type: In-patient    Dahlia ByesBerkelhammer, Zaynab Chipman [facility] 03/22/2018, 8:55 AM

## 2018-03-24 ENCOUNTER — Other Ambulatory Visit: Payer: BLUE CROSS/BLUE SHIELD

## 2018-03-24 ENCOUNTER — Encounter: Payer: BLUE CROSS/BLUE SHIELD | Admitting: Family Medicine

## 2018-03-29 ENCOUNTER — Ambulatory Visit (INDEPENDENT_AMBULATORY_CARE_PROVIDER_SITE_OTHER): Payer: BLUE CROSS/BLUE SHIELD | Admitting: *Deleted

## 2018-03-29 VITALS — BP 125/88 | HR 85 | Ht 63.0 in | Wt 132.7 lb

## 2018-03-29 DIAGNOSIS — Z0130 Encounter for examination of blood pressure without abnormal findings: Secondary | ICD-10-CM

## 2018-03-29 NOTE — Progress Notes (Signed)
Pt denies H/A or visual disturbances. BP values reported to Venia CarbonJennifer Rasch, NP. Pt has PCP Denyse Amass(Corey) @ [facility] and will schedule appt for management of CHTN. Pt is breastfeeding and reports concerns regarding loss of milk supply. Marylynn PearsonCarrie Hillman RNC in to speak w/pt and provided hand-out information.  Pt was also advised to schedule appt w/Lactation Consultant. She voiced understanding of all information and instructions given.

## 2018-03-31 ENCOUNTER — Encounter: Payer: BLUE CROSS/BLUE SHIELD | Admitting: Obstetrics & Gynecology

## 2018-03-31 ENCOUNTER — Other Ambulatory Visit: Payer: BLUE CROSS/BLUE SHIELD

## 2018-03-31 NOTE — Progress Notes (Signed)
I agree with the nurses note and documentation of visit and plan of care. Patient to follow up with her PCP for further management.  Duane Lopeasch, Jennifer I, NP 03/31/2018 10:36 AM

## 2018-04-07 ENCOUNTER — Encounter: Payer: BLUE CROSS/BLUE SHIELD | Admitting: Family Medicine

## 2018-04-07 ENCOUNTER — Other Ambulatory Visit: Payer: BLUE CROSS/BLUE SHIELD

## 2018-04-14 ENCOUNTER — Other Ambulatory Visit: Payer: BLUE CROSS/BLUE SHIELD

## 2018-04-14 ENCOUNTER — Encounter: Payer: BLUE CROSS/BLUE SHIELD | Admitting: Obstetrics and Gynecology

## 2018-05-02 ENCOUNTER — Encounter: Payer: Self-pay | Admitting: Advanced Practice Midwife

## 2018-05-02 ENCOUNTER — Ambulatory Visit (INDEPENDENT_AMBULATORY_CARE_PROVIDER_SITE_OTHER): Payer: Medicaid Other | Admitting: Advanced Practice Midwife

## 2018-05-02 DIAGNOSIS — Z3202 Encounter for pregnancy test, result negative: Secondary | ICD-10-CM

## 2018-05-02 DIAGNOSIS — Z1389 Encounter for screening for other disorder: Secondary | ICD-10-CM

## 2018-05-02 LAB — POCT PREGNANCY, URINE: Preg Test, Ur: NEGATIVE

## 2018-05-02 MED ORDER — NORETHINDRONE 0.35 MG PO TABS: 1.0000 | ORAL_TABLET | Freq: Every day | ORAL | 11 refills | Status: DC

## 2018-05-02 NOTE — Progress Notes (Signed)
Subjective:     Courtney RoyaltyBrittany Dolbow is a 27 y.o. female who presents for a postpartum visit. She is 6 weeks postpartum following a spontaneous vaginal delivery. I have fully reviewed the prenatal and intrapartum course. The delivery was at 37 gestational weeks. Outcome: spontaneous vaginal delivery. Anesthesia: epidural. Postpartum course has been uncomplicated. Babies' course has been uncomplicated. Babies have been feeding by breast/ bottle. Bleeding no bleeding. Bowel function is abnormal: - some constipation; but going regularly. Bladder function is normal. Patient is not sexually active. Contraception method is none. Postpartum depression screening: negative.  The following portions of the patient's history were reviewed and updated as appropriate: allergies, current medications, past family history, past medical history, past social history, past surgical history and problem list.  Review of Systems Pertinent items are noted in HPI.   Objective:    There were no vitals taken for this visit.  General:  alert and cooperative   Breasts:  negative  Lungs: clear to auscultation bilaterally  Heart:  regular rate and rhythm, S1, S2 normal, no murmur, click, rub or gallop  Abdomen: soft, non-tender; bowel sounds normal; no masses,  no organomegaly   Vulva:  not evaluated  Vagina: not evaluated  Cervix:  not evaluated   Corpus: not examined  Adnexa:  not evaluated  Rectal Exam: Not performed.        Assessment:     Normal postpartum exam. Pap smear not done at today's visit.   Plan:    1. Contraception: oral progesterone-only contraceptive 2. Patient knows to call or send mychart message when she is ready to switch to a COC 3. Follow up in: 1 year or as needed.

## 2018-05-06 ENCOUNTER — Encounter: Payer: BLUE CROSS/BLUE SHIELD | Admitting: Adult Health

## 2018-05-06 ENCOUNTER — Encounter: Payer: Self-pay | Admitting: Adult Health

## 2018-05-06 NOTE — Progress Notes (Deleted)
Subjective:    Patient ID: Courtney [name], female    DOB: 1990/11/06, 27 y.o.   MRN: [id number]  HPI  Patient presents for yearly preventative medicine examination. She is a pleasant and healthy 27 year old female who  has a past medical history of Anemia, Migraine, and Trichomonal vaginitis during pregnancy.  She is happy to report that she recently gave birth to a new baby. She is approx 6 weeks postpartum. She reports doing well   All immunizations and health maintenance protocols were reviewed with the patient and needed orders were placed. UTD  Appropriate screening laboratory values were ordered for the patient including screening of hyperlipidemia, renal function and hepatic function.  Medication reconciliation,  past medical history, social history, problem list and allergies were reviewed in detail with the patient  Goals were established with regard to weight loss, exercise, and  diet in compliance with medications Wt Readings from Last 3 Encounters:  05/02/18 131 lb 1.6 oz (59.5 kg)  03/29/18 132 lb 11.2 oz (60.2 kg)  03/20/18 154 lb 4 oz (70 kg)    Review of Systems  Constitutional: Negative.   HENT: Negative.   Eyes: Negative.   Respiratory: Negative.   Cardiovascular: Negative.   Gastrointestinal: Negative.   Endocrine: Negative.   Genitourinary: Negative.   Musculoskeletal: Negative.   Skin: Negative.   Allergic/Immunologic: Negative.   Neurological: Negative.   Hematological: Negative.   Psychiatric/Behavioral: Negative.    Past Medical History:  Diagnosis Date  . Anemia   . Migraine   . Trichomonal vaginitis during pregnancy     Social History   Socioeconomic History  . Marital status: Married    Spouse name: Not on file  . Number of children: Not on file  . Years of education: Not on file  . Highest education level: Not on file  Occupational History  . Not on file  Social Needs  . Financial resource strain: Not on file  . Food insecurity:     Worry: Not on file    Inability: Not on file  . Transportation needs:    Medical: Not on file    Non-medical: Not on file  Tobacco Use  . Smoking status: Never Smoker  . Smokeless tobacco: Never Used  Substance and Sexual Activity  . Alcohol use: No  . Drug use: No  . Sexual activity: Not Currently    Birth control/protection: None  Lifestyle  . Physical activity:    Days per week: Not on file    Minutes per session: Not on file  . Stress: Not on file  Relationships  . Social connections:    Talks on phone: Not on file    Gets together: Not on file    Attends religious service: Not on file    Active member of club or organization: Not on file    Attends meetings of clubs or organizations: Not on file    Relationship status: Not on file  . Intimate partner violence:    Fear of current or ex partner: Not on file    Emotionally abused: Not on file    Physically abused: Not on file    Forced sexual activity: Not on file  Other Topics Concern  . Not on file  Social History Narrative   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    Married     Past Surgical History:  Procedure Laterality Date  . NO PAST SURGERIES    . WISDOM TOOTH EXTRACTION  Family History  Problem Relation Age of Onset  . Diabetes Mother   . Multiple sclerosis Mother   . Fibromyalgia Mother   . Kidney disease Father   . Hypertension Father   . Diabetes Maternal Uncle   . Diabetes Maternal Grandmother   . Cancer Paternal Grandfather        lung  . Autism Paternal Uncle        half-uncle ( husbands brother)   . Hypertension Maternal Grandfather   . Heart attack Maternal Grandfather   . Stroke Maternal Grandfather   . Hypertension Paternal Grandmother   . Stroke Paternal Grandmother   . Heart disease Paternal Grandmother     No Known Allergies  Current Outpatient Medications on File Prior to Visit  Medication Sig Dispense Refill  . norethindrone (MICRONOR,CAMILA,ERRIN) 0.35 MG tablet Take 1 tablet  (0.35 mg total) by mouth daily. 1 Package 11  . Prenatal Vit-Fe Fumarate-FA (PRENATAL MULTIVITAMIN) TABS tablet Take 1 tablet by mouth at bedtime.     No current facility-administered medications on file prior to visit.     There were no vitals taken for this visit.      Objective:   Physical Exam  Constitutional: She is oriented to person, place, and time. She appears well-developed and well-nourished. No distress.  HENT:  Head: Normocephalic and atraumatic.  Right Ear: External ear normal.  Left Ear: External ear normal.  Nose: Nose normal.  Mouth/Throat: Oropharynx is clear and moist. No oropharyngeal exudate.  Eyes: Pupils are equal, round, and reactive to light. Conjunctivae and EOM are normal. Right eye exhibits no discharge. Left eye exhibits no discharge. No scleral icterus.  Neck: Normal range of motion. Neck supple. No JVD present. No tracheal deviation present. No thyromegaly present.  Cardiovascular: Normal rate, regular rhythm, normal heart sounds and intact distal pulses. Exam reveals no gallop and no friction rub.  No murmur heard. Pulmonary/Chest: Effort normal and breath sounds normal. No stridor. No respiratory distress. She has no wheezes. She has no rales. She exhibits no tenderness.  Abdominal: Soft. Bowel sounds are normal. She exhibits no distension and no mass. There is no tenderness. There is no rebound and no guarding. No hernia.  Musculoskeletal: Normal range of motion. She exhibits no edema, tenderness or deformity.  Lymphadenopathy:    She has no cervical adenopathy.  Neurological: She is alert and oriented to person, place, and time. She displays normal reflexes. No cranial nerve deficit or sensory deficit. She exhibits normal muscle tone. Coordination normal.  Skin: Skin is warm and dry. No rash noted. She is not diaphoretic. No erythema. No pallor.  Psychiatric: She has a normal mood and affect. Her behavior is normal. Judgment and thought content normal.   Nursing note and vitals reviewed.      Assessment & Plan:

## 2018-05-11 ENCOUNTER — Emergency Department (HOSPITAL_COMMUNITY)
Admission: EM | Admit: 2018-05-11 | Discharge: 2018-05-12 | Disposition: A | Discharge status: Home / Self Care | Payer: Medicaid Other | Attending: Emergency Medicine | Admitting: Emergency Medicine

## 2018-05-11 ENCOUNTER — Encounter (HOSPITAL_COMMUNITY): Payer: Self-pay | Admitting: Emergency Medicine

## 2018-05-11 DIAGNOSIS — Z79899 Other long term (current) drug therapy: Secondary | ICD-10-CM | POA: Insufficient documentation

## 2018-05-11 DIAGNOSIS — N644 Mastodynia: Secondary | ICD-10-CM | POA: Diagnosis present

## 2018-05-11 DIAGNOSIS — N61 Mastitis without abscess: Secondary | ICD-10-CM | POA: Insufficient documentation

## 2018-05-11 LAB — CBC WITH DIFFERENTIAL/PLATELET
Abs Immature Granulocytes: 0 10*3/uL (ref 0.0–0.1)
BASOS ABS: 0 10*3/uL (ref 0.0–0.1)
BASOS PCT: 0 %
EOS PCT: 0 %
Eosinophils Absolute: 0 10*3/uL (ref 0.0–0.7)
HCT: 41.9 % (ref 36.0–46.0)
Hemoglobin: 13.6 g/dL (ref 12.0–15.0)
Immature Granulocytes: 1 %
Lymphocytes Relative: 13 %
Lymphs Abs: 0.8 10*3/uL (ref 0.7–4.0)
MCH: 27.8 pg (ref 26.0–34.0)
MCHC: 32.5 g/dL (ref 30.0–36.0)
MCV: 85.5 fL (ref 78.0–100.0)
Monocytes Absolute: 0.6 10*3/uL (ref 0.1–1.0)
Monocytes Relative: 10 %
NEUTROS ABS: 4.4 10*3/uL (ref 1.7–7.7)
Neutrophils Relative %: 76 %
Platelets: 219 10*3/uL (ref 150–400)
RBC: 4.9 MIL/uL (ref 3.87–5.11)
RDW: 12.9 % (ref 11.5–15.5)
WBC: 5.8 10*3/uL (ref 4.0–10.5)

## 2018-05-11 LAB — URINALYSIS, ROUTINE W REFLEX MICROSCOPIC
Bilirubin Urine: NEGATIVE
Glucose, UA: NEGATIVE mg/dL
HGB URINE DIPSTICK: NEGATIVE
Ketones, ur: NEGATIVE mg/dL
Leukocytes, UA: NEGATIVE
Nitrite: NEGATIVE
Protein, ur: NEGATIVE mg/dL
Specific Gravity, Urine: 1.011 (ref 1.005–1.030)
pH: 6 (ref 5.0–8.0)

## 2018-05-11 LAB — BASIC METABOLIC PANEL
Anion gap: 9 (ref 5–15)
BUN: 7 mg/dL (ref 6–20)
CALCIUM: 9.4 mg/dL (ref 8.9–10.3)
CO2: 26 mmol/L (ref 22–32)
Chloride: 104 mmol/L (ref 98–111)
Creatinine, Ser: 0.73 mg/dL (ref 0.44–1.00)
GFR calc Af Amer: >60 mL/min (ref >60)
GFR calc non Af Amer: >60 mL/min (ref >60)
Glucose, Bld: 99 mg/dL (ref 70–99)
Potassium: 3.8 mmol/L (ref 3.5–5.1)
Sodium: 139 mmol/L (ref 135–145)

## 2018-05-11 LAB — I-STAT CG4 LACTIC ACID, ED: Lactic Acid, Venous: 1.21 mmol/L (ref 0.5–1.9)

## 2018-05-11 MED ORDER — SODIUM CHLORIDE 0.9 % IV SOLN
1000.0000 mL | INTRAVENOUS | Status: DC
  Administered 2018-05-11: 1000 mL via INTRAVENOUS

## 2018-05-11 MED ORDER — SODIUM CHLORIDE 0.9 % IV BOLUS (SEPSIS)
1000.0000 mL | Freq: Once | INTRAVENOUS | Status: AC
  Administered 2018-05-11: 1000 mL via INTRAVENOUS

## 2018-05-11 MED ORDER — CEPHALEXIN 500 MG PO CAPS: 500.0000 mg | ORAL_CAPSULE | Freq: Two times a day (BID) | ORAL | 0 refills | Status: DC

## 2018-05-11 MED ORDER — ACETAMINOPHEN 500 MG PO TABS
1000.0000 mg | ORAL_TABLET | Freq: Once | ORAL | Status: AC
  Administered 2018-05-11: 1000 mg via ORAL
  Filled 2018-05-11: qty 2

## 2018-05-11 MED ORDER — SODIUM CHLORIDE 0.9 % IV SOLN
1.0000 g | Freq: Once | INTRAVENOUS | Status: AC
  Administered 2018-05-11: 1 g via INTRAVENOUS
  Filled 2018-05-11: qty 10

## 2018-05-11 NOTE — ED Provider Notes (Signed)
Patient signed out to me.   10:45 PM Reassessed.  Will give Tylenol for fever.  UA is clean.  Will give first dose of rocephin.   Agree with plan for treatment for early mastitis.  DC home with Keflex.   Encouraged to continue breast feeding and to take abx as directed.  Non-toxic appearing.  Agrees with discharge and f/u plan.   Roxy HorsemanBrowning, Magdala Brahmbhatt, PA-C 05/11/18 2308    Linwood DibblesKnapp, Jon, MD 05/12/18 [phone]

## 2018-05-11 NOTE — ED Provider Notes (Signed)
[facility] EMERGENCY DEPARTMENT Provider Note   CSN: [id number] Arrival date & time: 05/11/18  2043     History   Chief Complaint Chief Complaint  Patient presents with  . Breast Pain    HPI Courtney [name] is a 27 y.o. female.  HPI Patient presents to the emergency room for evaluation of body aches and low-grade fever.  Patient states when she woke up today she started having some generalized malaise and myalgias.  She felt as if she was developing the flu.  She also developed a bit of a headache but denies any neck pain or stiffness.  She called the nurse up recommended she come to the emergency room to get evaluated.  Patient denies any sore throat or cough.  No vomiting or diarrhea.  No dysuria.  She has noticed some breast soreness and tenderness.  Somewhat more than usual but not too different than when her 69-week-old twins were first born.  She states it is uncomfortable when she breast-feeds Past Medical History:  Diagnosis Date  . Anemia   . Migraine   . Trichomonal vaginitis during pregnancy     Patient Active Problem List   Diagnosis Date Noted  . Labor and delivery indication for care or intervention 03/20/2018  . SVD (spontaneous vaginal delivery) 03/20/2018  . Chronic hypertension during pregnancy, antepartum 09/22/2017  . Supervision of high risk pregnancy, antepartum 09/22/2017  . History of prior pregnancy with SGA newborn 09/22/2017  . Monochorionic diamniotic twin pregnancy, antepartum 09/22/2017    Past Surgical History:  Procedure Laterality Date  . NO PAST SURGERIES    . WISDOM TOOTH EXTRACTION       OB History    Gravida  3   Para  2   Term  2   Preterm      AB  1   Living  3     SAB  1   TAB      Ectopic      Multiple  1   Live Births  3            Home Medications    Prior to Admission medications   Medication Sig Start Date End Date Taking? Authorizing Provider  norethindrone  (MICRONOR,CAMILA,ERRIN) 0.35 MG tablet Take 1 tablet (0.35 mg total) by mouth daily. 05/02/18   Armando Reichert, CNM  Prenatal Vit-Fe Fumarate-FA (PRENATAL MULTIVITAMIN) TABS tablet Take 1 tablet by mouth at bedtime.    [provider]    Family History Family History  Problem Relation Age of Onset  . Diabetes Mother   . Multiple sclerosis Mother   . Fibromyalgia Mother   . Kidney disease Father   . Hypertension Father   . Diabetes Maternal Uncle   . Diabetes Maternal Grandmother   . Cancer Paternal Grandfather        lung  . Autism Paternal Uncle        half-uncle ( husbands brother)   . Hypertension Maternal Grandfather   . Heart attack Maternal Grandfather   . Stroke Maternal Grandfather   . Hypertension Paternal Grandmother   . Stroke Paternal Grandmother   . Heart disease Paternal Grandmother     Social History Social History   Tobacco Use  . Smoking status: Never Smoker  . Smokeless tobacco: Never Used  Substance Use Topics  . Alcohol use: No  . Drug use: No     Allergies   Patient has no known allergies.   Review of  Systems Review of Systems  All other systems reviewed and are negative.    Physical Exam Updated Vital Signs BP (!) 148/92 (BP Location: Right Arm)   Pulse 98   Temp (!) 100.5 F (38.1 C) (Oral)   Resp 16   Ht 1.575 m (5\' 2" )   Wt 59.4 kg (131 lb)   SpO2 100%   BMI 23.96 kg/m   Physical Exam  Constitutional: She appears well-developed and well-nourished. No distress.  HENT:  Head: Normocephalic and atraumatic.  Right Ear: External ear normal.  Left Ear: External ear normal.  Mouth/Throat: No oropharyngeal exudate.  Eyes: Conjunctivae are normal. Right eye exhibits no discharge. Left eye exhibits no discharge. No scleral icterus.  Neck: Neck supple. No tracheal deviation present.  Cardiovascular: Normal rate, regular rhythm and intact distal pulses.  Pulmonary/Chest: Effort normal and breath sounds normal. No stridor.  No respiratory distress. She has no wheezes. She has no rales.  Mild erythema of the bilateral breasts, increased warmth and tenderness to palpation  Abdominal: Soft. Bowel sounds are normal. She exhibits no distension. There is no tenderness. There is no rebound and no guarding.  Musculoskeletal: She exhibits no edema, tenderness or deformity.  Neurological: She is alert. She has normal strength. No cranial nerve deficit (no facial droop, extraocular movements intact, no slurred speech) or sensory deficit. She exhibits normal muscle tone. She displays no seizure activity. Coordination normal.  Skin: Skin is warm and dry. No rash noted.  Psychiatric: She has a normal mood and affect.  Nursing note and vitals reviewed.    ED Treatments / Results  Labs (all labs ordered are listed, but only abnormal results are displayed) Labs Reviewed  CBC WITH DIFFERENTIAL/PLATELET  BASIC METABOLIC PANEL  URINALYSIS, ROUTINE W REFLEX MICROSCOPIC  I-STAT CG4 LACTIC ACID, ED     Procedures Procedures (including critical care time)  Medications Ordered in ED Medications  sodium chloride 0.9 % bolus 1,000 mL (has no administration in time range)    Followed by  0.9 %  sodium chloride infusion (has no administration in time range)     Initial Impression / Assessment and Plan / ED Course  I have reviewed the triage vital signs and the nursing notes.  Pertinent labs & imaging results that were available during my care of the patient were reviewed by me and considered in my medical decision making (see chart for details).    Pt presents with myalgias and feveer.  She does have some mild breast tenderness but no severe signs of mastitis.  Will check labs, ua.  If negative will treat for mastitis.  PA Dahlia ClientBrowning will follow up on the results.  Final Clinical Impressions(s) / ED Diagnoses  pending   Linwood DibblesKnapp, Larya Charpentier, MD 05/11/18 2222

## 2018-05-11 NOTE — ED Triage Notes (Signed)
Patient reports bilateral breasts tenderness/sore onset this morning , she is breast feeding , denies injury .

## 2018-06-02 ENCOUNTER — Ambulatory Visit: Payer: Self-pay

## 2018-06-02 NOTE — Lactation Note (Signed)
This note was copied from a baby's chart. Infants present today with mom and dad as infants have thrush, mom would like to rule out yeast on her breasts.   Mom and dad were feeding infant's bottles when LC arrived. Mom did not want to latch infants to the breast.   Courtney [name] weighs 11 pounds 1.6 ounces (5036 grams) with diaper and onesie.   Courtney [name] weighs 10 pounds 13.1 ounces (4906 grams) with diaper and onesie.   Infant were BF until a few weeks ago. Mom reports she got Mastitis and was given 7 days of Antibiotics for treatment. Infants have oral thrush and have been treated for 14 days. Courtney [name] is still present in both infant's mouths. Infants have had a diaper rash that has recently gone away per mom. Advised mom to call Ped to let them know that both infant still have Thrush in their mouths.   Infants are feeding breast milk and formula in a bottle with about 1/2 of their feeding by formula and 1/2 by breast milk. Mom is pumping about every 4 hours. She did decrease her pumping to every 5-6 hours and noted a decrease in supply so upped pumping, reviewed supply and demand.   Mom is not sure if she will continue to BF or pump once she returns to work in September. Gave mom informational handout from [email or web address] weaning pumping so as to avoid mastitis and plugged ducts. Reviewed with mom how she may be creative with her pumping and BF infants once she returns to work 1-2 days a week.   Mom is pumping and reports pain to nipples with pumping causing her to have to pause pumping to rest. Her nipples are pink in appearance. She denies pain in the breasts. Reviewed how to treat Thrush in the infants and in mom. Mom to get Lotrimin AF or Monistat for treatment and if not improving she is to call OB to ask for APNO. Mom reports she has vaginal yeast symptoms but was afraid to use Monistat as she was afraid it would effect her milk, enc mom to treat herself to rid herself of yeast also.   Mom is sterilizing  bottles daily. She is not sterilizing pump parts. Reviewed that heat kills yeast and would recommend boiling all that come into contact with infant's mouths including toys. Mom reports infants do not use pacifiers. Enc mom to wash all clothing and bras that come into contact with the breast, milk or infant's mouths in hot water and hot dryer daily. Mom has frozen breast milk in the freezer, discussed that freezing does not kill yeast and may reinfect infants if used.   Mom to call Ped to inform of continues thrush and to follow Yeast Protocol for treatment for her and the infants. Mom reports all questions/concerns have been answered, mom to call with any questions/concerns as needed.   Plan:  Follow yeast protocol Handout as provided  1. After each nursing or pumping rinse breasts with water or vinegar wash  (1 Tablespoon Vinegar to 1 cup water) 2. Apply Antifungal cream to nipples after washing. Can try Lotrimin AF and if does not work after 5-6 days may call OB and request All Purpose Nipple Ointment 3. Air Dry Nipples 4. If wearing breast pads, change frequently 5. Wash bra in hot water every day and dry in hot dryer 6. Continue to pump to maintain milk supply 7. Avoid sweets, yeast products and dairy products. Increase dietary Acidophylis tablets, garlic, zinc, and B  vitamins per instructions on the package 8. Do not freeze milk while being treated as freezing does not kill yeast 9. Rinse infant mouth after feeding with plain water 10. Apply Antifungal prescribed by Pediatrician, call Pediatrician to let them know that infants still have thrust currently 11. Ask doctor about diaper rash treatment 12. Careful handwashing 13. Boil all items that come into contact with the breast and the infant's mouths daily for 20 minutes 14. Can go ahead and treat vaginal yeast infection if needed 15. Continue treatment for mom and babies for 1-2 weeks after all symptoms are gone 16. Call OB if you develop  shooting pains throughout your breasts to discuss Diflucan Treatment 17. Keep up the good work 18. Call with any questions/concerns as needed [phone] 19. Follow up with Lactation as needed Courtney [name] 06/02/2018, 2:37 PM

## 2018-06-02 NOTE — Lactation Note (Signed)
This note was copied from a baby's chart. Infant present today with mom and dad as infants have thrush, mom would like to rule out yeast on her breasts.   Mom and dad were feeding infant's bottles when LC arrived. Mom did not want to latch infants to the breast.   Courtney [name] weighs 11 pounds 1.6 ounces (5036 grams) with diaper and onesie.   Courtney [name] weighs 10 pounds 13.1 ounces (4906 grams) with diaper and onesie.   Infant were BF until a few weeks ago. Mom reports she got Mastitis and was given 7 days of Antibiotics for treatment. Infants have oral thrush and have been treated for 14 days. Ginette Pitmanhrush is still present in both infant's mouths. Infants have had a diaper rash that has recently gone away per mom. Advised mom to call Ped to let them know that both infant still have Thrush in their mouths.   Infants are feeding breast milk and formula in a bottle with about 1/2 of their feeding by formula and 1/2 by breast milk. Mom is pumping about every 4 hours. She did decrease her pumping to every 5-6 hours and noted a decrease in supply so upped pumping, reviewed supply and demand.   Mom is not sure if she will continue to BF or pump once she returns to work in September. Gave mom informational handout from [email or web address] weaning pumping so as to avoid mastitis and plugged ducts. Reviewed with mom how she may be creative with her pumping and BF infants once she returns to work 1-2 days a week.   Mom is pumping and reports pain to nipples with pumping causing her to have to pause pumping to rest. Her nipples are pink in appearance. She denies pain in the breasts. Reviewed how to treat Thrush in the infants and in mom. Mom to get Lotrimin AF or Monistat for treatment and if not improving she is to call OB to ask for APNO. Mom reports she has vaginal yeast symptoms but was afraid to use Monistat as she was afraid it would effect her milk, enc mom to treat herself to rid herself of yeast also.   Mom is sterilizing  bottles daily. She is not sterilizing pump parts. Reviewed that heat kills yeast and would recommend boiling all that come into contact with infant's mouths including toys. Mom reports infants do not use pacifiers. Enc mom to wash all clothing and bras that come into contact with the breast, milk or infant's mouths in hot water and hot dryer daily. Mom has frozen breast milk in the freezer, discussed that freezing does not kill yeast and may reinfect infants if used.   Mom to call Ped to inform of continues thrush and to follow Yeast Protocol for treatment for her and the infants. Mom reports all questions/concerns have been answered, mom to call with any questions/concerns as needed.   Plan:  Follow yeast protocol Handout as provided  1. After each nursing or pumping rinse breasts with water or vinegar wash  (1 Tablespoon Vinegar to 1 cup water) 2. Apply Antifungal cream to nipples after washing. Can try Lotrimin AF and if does not work after 5-6 days may call OB and request All Purpose Nipple Ointment 3. Air Dry Nipples 4. If wearing breast pads, change frequently 5. Wash bra in hot water every day and dry in hot dryer 6. Continue to pump to maintain milk supply 7. Avoid sweets, yeast products and dairy products. Increase dietary Acidophylis tablets, garlic, zinc, and B  vitamins per instructions on the package 8. Do not freeze milk while being treated as freezing does not kill yeast 9. Rinse infant mouth after feeding with plain water 10. Apply Antifungal prescribed by Pediatrician, call Pediatrician to let them know that infants still have thrust currently 11. Ask doctor about diaper rash treatment 12. Careful handwashing 13. Boil all items that come into contact with the breast and the infant's mouths daily for 20 minutes 14. Can go ahead and treat vaginal yeast infection if needed 15. Continue treatment for mom and babies for 1-2 weeks after all symptoms are gone 16. Call OB if you develop  shooting pains throughout your breasts to discuss Diflucan Treatment 17. Keep up the good work 18. Call with any questions/concerns as needed [phone] 19. Follow up with Lactation as needed  Ed BlalockSharon S Hardin Hardenbrook 06/02/2018, 2:54 PM

## 2018-06-09 ENCOUNTER — Ambulatory Visit (INDEPENDENT_AMBULATORY_CARE_PROVIDER_SITE_OTHER): Payer: Medicaid Other | Admitting: Adult Health

## 2018-06-09 ENCOUNTER — Encounter: Payer: Self-pay | Admitting: Adult Health

## 2018-06-09 VITALS — BP 120/78 | HR 81 | Temp 98.2°F | Ht 63.0 in | Wt 128.0 lb

## 2018-06-09 DIAGNOSIS — Z3009 Encounter for other general counseling and advice on contraception: Secondary | ICD-10-CM | POA: Diagnosis not present

## 2018-06-09 DIAGNOSIS — Z0000 Encounter for general adult medical examination without abnormal findings: Secondary | ICD-10-CM | POA: Diagnosis not present

## 2018-06-09 LAB — HEPATIC FUNCTION PANEL
ALT: 16 U/L (ref 0–35)
AST: 15 U/L (ref 0–37)
Albumin: 4.6 g/dL (ref 3.5–5.2)
Alkaline Phosphatase: 111 U/L (ref 39–117)
Bilirubin, Direct: 0.1 mg/dL (ref 0.0–0.3)
Total Bilirubin: 0.6 mg/dL (ref 0.2–1.2)
Total Protein: 8.2 g/dL (ref 6.0–8.3)

## 2018-06-09 LAB — TSH: TSH: 1.4 u[IU]/mL (ref 0.35–4.50)

## 2018-06-09 LAB — LIPID PANEL
CHOL/HDL RATIO: 4
CHOLESTEROL: 236 mg/dL — AB (ref 0–200)
HDL: 64.4 mg/dL (ref >39.00)
LDL CALC: 144 mg/dL — AB (ref 0–99)
NonHDL: 171.18
TRIGLYCERIDES: 135 mg/dL (ref 0.0–149.0)
VLDL: 27 mg/dL (ref 0.0–40.0)

## 2018-06-09 LAB — POCT URINE PREGNANCY: PREG TEST UR: NEGATIVE

## 2018-06-09 NOTE — Progress Notes (Signed)
Subjective:    Patient ID: Courtney [name], female    DOB: Apr 08, 1991, 27 y.o.   MRN: [id number]  HPI Patient presents for yearly preventative medicine examination. She is a pleasant 27 year old female who  has a past medical history of Anemia, Migraine, and Trichomonal vaginitis during pregnancy.  Since the last time I have seen [name] she is given birth to twin boys at 88 gestational weeks.  Reports that she and her babies are doing well.  She denies any vaginal bleeding, chest pain, shortness of breath or abdominal pain  All immunizations and health maintenance protocols were reviewed with the patient and needed orders were placed.  Appropriate screening laboratory values were ordered for the patient including screening of hyperlipidemia, renal function and hepatic function.  Medication reconciliation,  past medical history, social history, problem list and allergies were reviewed in detail with the patient  Goals were established with regard to weight loss, exercise, and  diet in compliance with medications  Wt Readings from Last 3 Encounters:  06/09/18 128 lb (58.1 kg)  05/11/18 131 lb (59.4 kg)  05/02/18 131 lb 1.6 oz (59.5 kg)   She denies any acute complaints.   Review of Systems  Constitutional: Negative.   HENT: Negative.   Eyes: Negative.   Respiratory: Negative.   Cardiovascular: Negative.   Gastrointestinal: Negative.   Endocrine: Negative.   Genitourinary: Negative.   Musculoskeletal: Negative.   Skin: Negative.   Allergic/Immunologic: Negative.   Neurological: Negative.   Hematological: Negative.   Psychiatric/Behavioral: Negative.    Past Medical History:  Diagnosis Date  . Anemia   . Migraine   . Trichomonal vaginitis during pregnancy     Social History   Socioeconomic History  . Marital status: Married    Spouse name: Not on file  . Number of children: Not on file  . Years of education: Not on file  . Highest education level: Not on file    Occupational History  . Not on file  Social Needs  . Financial resource strain: Not on file  . Food insecurity:    Worry: Not on file    Inability: Not on file  . Transportation needs:    Medical: Not on file    Non-medical: Not on file  Tobacco Use  . Smoking status: Never Smoker  . Smokeless tobacco: Never Used  Substance and Sexual Activity  . Alcohol use: No  . Drug use: No  . Sexual activity: Not Currently    Birth control/protection: None  Lifestyle  . Physical activity:    Days per week: Not on file    Minutes per session: Not on file  . Stress: Not on file  Relationships  . Social connections:    Talks on phone: Not on file    Gets together: Not on file    Attends religious service: Not on file    Active member of club or organization: Not on file    Attends meetings of clubs or organizations: Not on file    Relationship status: Not on file  . Intimate partner violence:    Fear of current or ex partner: Not on file    Emotionally abused: Not on file    Physically abused: Not on file    Forced sexual activity: Not on file  Other Topics Concern  . Not on file  Social History Narrative   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    Married     Past  Surgical History:  Procedure Laterality Date  . NO PAST SURGERIES    . WISDOM TOOTH EXTRACTION      Family History  Problem Relation Age of Onset  . Diabetes Mother   . Multiple sclerosis Mother   . Fibromyalgia Mother   . Kidney disease Father   . Hypertension Father   . Diabetes Maternal Uncle   . Diabetes Maternal Grandmother   . Cancer Paternal Grandfather        lung  . Autism Paternal Uncle        half-uncle ( husbands brother)   . Hypertension Maternal Grandfather   . Heart attack Maternal Grandfather   . Stroke Maternal Grandfather   . Hypertension Paternal Grandmother   . Stroke Paternal Grandmother   . Heart disease Paternal Grandmother     No Known Allergies  Current Outpatient Medications on File  Prior to Visit  Medication Sig Dispense Refill  . norethindrone (MICRONOR,CAMILA,ERRIN) 0.35 MG tablet Take 1 tablet (0.35 mg total) by mouth daily. (Patient not taking: Reported on 05/11/2018) 1 Package 11   No current facility-administered medications on file prior to visit.     BP 120/78 (BP Location: Left Arm, Patient Position: Sitting, Cuff Size: Normal)   Pulse 81   Temp 98.2 F (36.8 C) (Oral)   Ht 5\' 3"  (1.6 m)   Wt 128 lb (58.1 kg)   SpO2 98%   BMI 22.67 kg/m       Objective:   Physical Exam  Constitutional: She is oriented to person, place, and time. She appears well-developed and well-nourished. No distress.  HENT:  Head: Normocephalic and atraumatic.  Right Ear: External ear normal.  Left Ear: External ear normal.  Nose: Nose normal.  Mouth/Throat: Oropharynx is clear and moist. No oropharyngeal exudate.  Eyes: Pupils are equal, round, and reactive to light. Conjunctivae and EOM are normal. Right eye exhibits no discharge. Left eye exhibits no discharge. No scleral icterus.  Neck: Normal range of motion. Neck supple. No JVD present. No tracheal deviation present. No thyromegaly present.  Cardiovascular: Normal rate, regular rhythm, normal heart sounds and intact distal pulses. Exam reveals no gallop and no friction rub.  No murmur heard. Pulmonary/Chest: Effort normal and breath sounds normal. No stridor. No respiratory distress. She has no wheezes. She has no rales. She exhibits no tenderness.  Abdominal: Soft. Bowel sounds are normal. She exhibits no distension and no mass. There is no tenderness. There is no rebound and no guarding. No hernia.  Musculoskeletal: Normal range of motion. She exhibits no edema, tenderness or deformity.  Lymphadenopathy:    She has no cervical adenopathy.  Neurological: She is alert and oriented to person, place, and time. She displays normal reflexes. No cranial nerve deficit or sensory deficit. She exhibits normal muscle tone.  Coordination normal.  Skin: Skin is warm and dry. Capillary refill takes less than 2 seconds. No rash noted. She is not diaphoretic. No erythema. No pallor.  Psychiatric: She has a normal mood and affect. Her behavior is normal. Judgment and thought content normal.  Nursing note and vitals reviewed.      Assessment & Plan:  1. Routine general medical examination at a health care facility - BMP and CBC were done recently in ER - were normal. Will not repeat today  - Encouraged heart healthy diet and exercise  - Follow up in one year or sooner if needed - Lipid panel - TSH - Hepatic function panel - POCT urine pregnancy  Dorothyann Peng, NP

## 2018-06-10 ENCOUNTER — Encounter: Payer: Self-pay | Admitting: *Deleted

## 2018-10-19 NOTE — L&D Delivery Note (Signed)
Delivery Note At 1:35 PM a viable female was delivered via Vaginal, Spontaneous (Presentation: Right Occiput Anterior).  APGAR: 9, 9; weight  .   Placenta status: Spontaneous;Pathology, Intact.  Cord: 3 vessels with the following complications: None.  Cord pH: not sent.  Placenta sent to pathology to rule out abruption.  None seen and pt and baby did well  Anesthesia: Epidural Episiotomy: None Lacerations: None Suture Repair: na Est. Blood Loss (mL): 217  Mom to postpartum.  Baby to Couplet care / Skin to Skin.  Courtney [name] 10/11/2019, 2:20 PM

## 2018-11-25 ENCOUNTER — Ambulatory Visit (INDEPENDENT_AMBULATORY_CARE_PROVIDER_SITE_OTHER): Payer: Self-pay | Admitting: Internal Medicine

## 2018-11-25 ENCOUNTER — Ambulatory Visit: Payer: Self-pay | Admitting: *Deleted

## 2018-11-25 ENCOUNTER — Encounter: Payer: Self-pay | Admitting: Internal Medicine

## 2018-11-25 VITALS — BP 110/90 | HR 90 | Temp 98.7°F | Wt 130.9 lb

## 2018-11-25 DIAGNOSIS — R0789 Other chest pain: Secondary | ICD-10-CM

## 2018-11-25 NOTE — Telephone Encounter (Signed)
noted 

## 2018-11-25 NOTE — Patient Instructions (Addendum)
Great meeting you.  Instructions: -cont taking motrin as needed for pain -use ice in area that aches -call us back if no improvement in 10-14 days  Chest Wall Pain Chest wall pain is pain in or around the bones and muscles of your chest. Chest wall pain may be caused by:  An injury.  Coughing a lot.  Using your chest and arm muscles too much. Sometimes, the cause may not be known. This pain may take a few weeks or longer to get better. Follow these instructions at home: Managing pain, stiffness, and swelling If told, put ice on the painful area:  Put ice in a plastic bag.  Place a towel between your skin and the bag.  Leave the ice on for 20 minutes, 2-3 times a day.  Activity  Rest as told by your doctor.  Avoid doing things that cause pain. This includes lifting heavy items.  Ask your doctor what activities are safe for you. General instructions   Take over-the-counter and prescription medicines only as told by your doctor.  Do not use any products that contain nicotine or tobacco, such as cigarettes, e-cigarettes, and chewing tobacco. If you need help quitting, ask your doctor.  Keep all follow-up visits as told by your doctor. This is important. Contact a doctor if:  You have a fever.  Your chest pain gets worse.  You have new symptoms. Get help right away if:  You feel sick to your stomach (nauseous) or you throw up (vomit).  You feel sweaty or light-headed.  You have a cough with mucus from your lungs (sputum) or you cough up blood.  You are short of breath. These symptoms may be an emergency. Do not wait to see if the symptoms will go away. Get medical help right away. Call your local emergency services (911 in the [place] Do not drive yourself to the hospital. Summary  Chest wall pain is pain in or around the bones and muscles of your chest.  It may be treated with ice, rest, and medicines. Your condition may also get better if you avoid doing  things that cause pain.  Contact a doctor if you have a fever, chest pain that gets worse, or new symptoms.  Get help right away if you feel light-headed or you get short of breath. These symptoms may be an emergency. This information is not intended to replace advice given to you by your health care provider. Make sure you discuss any questions you have with your health care provider. Document Released: 03/23/2008 Document Revised: 04/07/2018 Document Reviewed: 04/07/2018 Elsevier Interactive Patient Education  2019 [employer].

## 2018-11-25 NOTE — Progress Notes (Signed)
Established Patient Office Visit     CC/Reason for Visit: left sided chest pain  HPI: Courtney [name] is a 28 y.o. female who is coming in today for the above mentioned reason.  Patient c/o left sided chest pain that started under her breast and move in towards her abdominal area, this started almost a week ago.  She says that the sensation comes and goes, that it doesn't hurt, but just bothersome.  Denies injury, going to the gym or fever.  Denies N/V/D and shortness of breath.  Denies cold symptoms lately.  She has twin infants and a 27 year old and she stays at home with all of them, she confirms she's very anxious.  She stopped breast feeding 5 months ago.  Patient sts she averages 4-5 hours of sleep at night.     Past Medical/Surgical History: Past Medical History:  Diagnosis Date  . Anemia   . Migraine   . Trichomonal vaginitis during pregnancy     Past Surgical History:  Procedure Laterality Date  . NO PAST SURGERIES    . WISDOM TOOTH EXTRACTION      Social History:  reports that she has never smoked. She has never used smokeless tobacco. She reports that she does not drink alcohol or use drugs.  Allergies: No Known Allergies  Family History:  Family History  Problem Relation Age of Onset  . Diabetes Mother   . Multiple sclerosis Mother   . Fibromyalgia Mother   . Kidney disease Father   . Hypertension Father   . Diabetes Maternal Uncle   . Diabetes Maternal Grandmother   . Cancer Paternal Grandfather        lung  . Autism Paternal Uncle        half-uncle ( husbands brother)   . Hypertension Maternal Grandfather   . Heart attack Maternal Grandfather   . Stroke Maternal Grandfather   . Hypertension Paternal Grandmother   . Stroke Paternal Grandmother   . Heart disease Paternal Grandmother     No current outpatient medications on file.  Review of Systems:  Constitutional: Denies fever, chills, diaphoresis, appetite change and fatigue.  HEENT: Denies  photophobia, eye pain, redness, hearing loss, ear pain, congestion, sore throat, rhinorrhea, sneezing, mouth sores, trouble swallowing, neck pain, neck stiffness and tinnitus.   Respiratory: Denies SOB, DOE, cough, chest tightness, and wheezing.   Cardiovascular: Denies chest pain, palpitations and leg swelling.  C/o being uncomfortable under left breast that radiates to mid chest area, comes and goes Gastrointestinal: Denies nausea, vomiting, abdominal pain, diarrhea, constipation, blood in stool and abdominal distention.  Genitourinary: Denies dysuria, urgency, frequency, hematuria, flank pain and difficulty urinating.  Endocrine: Denies: hot or cold intolerance, sweats, changes in hair or nails, polyuria, polydipsia. Musculoskeletal: Denies myalgias, back pain, joint swelling, arthralgias and gait problem.  Skin: Denies pallor, rash and wound.  Neurological: Denies dizziness, seizures, syncope, weakness, light-headedness, numbness and headaches.  Hematological: Denies adenopathy. Easy bruising, personal or family bleeding history  Psychiatric/Behavioral: Denies suicidal ideation, mood changes, confusion, nervousness, sleep disturbance and agitation   Physical Exam: Vitals:   11/25/18 1100  BP: 110/90  Pulse: 90  Temp: 98.7 F (37.1 C)  TempSrc: Oral  SpO2: 98%  Weight: 130 lb 14.4 oz (59.4 kg)    Body mass index is 23.19 kg/m.   Constitutional: NAD, calm, comfortable Eyes: PERRL, lids and conjunctivae normal ENMT: Mucous membranes are moist.  Neck: normal, supple, no masses, no thyromegaly Respiratory: clear to  auscultation bilaterally, no wheezing, no crackles. Normal respiratory effort. No accessory muscle use.  Cardiovascular: Regular rate and rhythm, no murmurs / rubs / gallops. No extremity edema. 2+ pedal pulses.  Breast:  Normal breast exam, no nodules or tenderness Abdomen: no tenderness, no masses palpated. No hepatosplenomegaly. Bowel sounds positive.    Musculoskeletal: no clubbing / cyanosis. No joint deformity upper and lower extremities. Good ROM, no contractures. Normal muscle tone.  Skin: no rashes, lesions, ulcers. No induration Neurologic: CN 2-12 grossly intact. Sensation intact, DTR normal. Strength 5/5 in all 4.  Psychiatric: Normal judgment and insight. Alert and oriented x 3. Normal mood.    Impression and Plan:  Left-sided chest wall pain -doubt cardiac in this young woman. Advised on warning signs to seek immediate medical attention. -Likely chest wall pain from carrying infant sons all the time. -use ice pack as needed  -cont taking motrin as needed  -f/u in 2 weeks if no improvement    Patient Instructions  Great meeting you.  Instructions: -cont taking motrin as needed for pain -use ice in area that aches -call us back if no improvement in 10-14 days  Chest Wall Pain Chest wall pain is pain in or around the bones and muscles of your chest. Chest wall pain may be caused by:  An injury.  Coughing a lot.  Using your chest and arm muscles too much. Sometimes, the cause may not be known. This pain may take a few weeks or longer to get better. Follow these instructions at home: Managing pain, stiffness, and swelling If told, put ice on the painful area:  Put ice in a plastic bag.  Place a towel between your skin and the bag.  Leave the ice on for 20 minutes, 2-3 times a day.  Activity  Rest as told by your doctor.  Avoid doing things that cause pain. This includes lifting heavy items.  Ask your doctor what activities are safe for you. General instructions   Take over-the-counter and prescription medicines only as told by your doctor.  Do not use any products that contain nicotine or tobacco, such as cigarettes, e-cigarettes, and chewing tobacco. If you need help quitting, ask your doctor.  Keep all follow-up visits as told by your doctor. This is important. Contact a doctor if:  You have a  fever.  Your chest pain gets worse.  You have new symptoms. Get help right away if:  You feel sick to your stomach (nauseous) or you throw up (vomit).  You feel sweaty or light-headed.  You have a cough with mucus from your lungs (sputum) or you cough up blood.  You are short of breath. These symptoms may be an emergency. Do not wait to see if the symptoms will go away. Get medical help right away. Call your local emergency services (911 in the [place] Do not drive yourself to the hospital. Summary  Chest wall pain is pain in or around the bones and muscles of your chest.  It may be treated with ice, rest, and medicines. Your condition may also get better if you avoid doing things that cause pain.  Contact a doctor if you have a fever, chest pain that gets worse, or new symptoms.  Get help right away if you feel light-headed or you get short of breath. These symptoms may be an emergency. This information is not intended to replace advice given to you by your health care provider. Make sure you discuss any questions you have with  your health care provider. Document Released: 03/23/2008 Document Revised: 04/07/2018 Document Reviewed: 04/07/2018 Elsevier Interactive Patient Education  2019 Elsevier Inc.      Murlean Iba, RN DNP student [facility] at [employer]

## 2018-11-25 NOTE — Telephone Encounter (Signed)
Pt calling with complaints of left shoulder pain that occurred today while leaving work. Pt was seen in the office today with Dr. Philip AspenHernandez Acosta for left sided chest pain. Pt states she did not mention shoulder pain during visit today. Pt states that the pain does not get worse with exertion and denies any numbness or tingling in the left arm. Pt states she feels like it is muscle pain but is unsure due to her history of anxiety. Pt given home care advice to help with shoulder pain.Pt advised to seek treatment in the ED if symptoms worsen. Pt verbalized understanding.  Reason for Disposition . Shoulder pain  Answer Assessment - Initial Assessment Questions 1. ONSET: "When did the pain start?"     Last night on the right side and was very brief and an hour ago on left side during drive 2. LOCATION: "Where is the pain located?"     In the left shoulder 3. PAIN: "How bad is the pain?" (Scale 1-10; or mild, moderate, severe)   - MILD (1-3): doesn't interfere with normal activities   - MODERATE (4-7): interferes with normal activities (e.g., work or school) or awakens from sleep   - SEVERE (8-10): excruciating pain, unable to do any normal activities, unable to move arm at all due to pain     4-5 when it occurs 4. 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 CAUSE: "What do you think is causing the shoulder pain?"     unknkown 6. OTHER SYMPTOMS: "Do you have any other symptoms?" (e.g., neck pain, swelling, rash, fever, numbness, weakness)     Was going into neck but has stopped currently  Protocols used: SHOULDER PAIN-A-AH

## 2018-11-25 NOTE — Telephone Encounter (Signed)
Pt called with complaints of intermittent chest pain on the left; the pain started 11/19/2018 and is becoming more frequent; she rates her pain at 1-2 out of 10; the pt says that the pain comes at anytime; nurse triage initiated; recommendations made per protocol; pt offered and accepted appointment with Shirline Frees, [facility], 11/25/2018; she verbalized understanding; will route to office for notification.   Reason for Disposition . [1] Chest pain lasting <= 5 minutes AND [2] NO chest pain or cardiac symptoms now(Exceptions: pains lasting a few seconds)  Answer Assessment - Initial Assessment Questions 1. LOCATION: "Where does it hurt?"       Left chest under breast near ribs 2. RADIATION: "Does the pain go anywhere else?" (e.g., into neck, jaw, arms, back)     no 3. ONSET: "When did the chest pain begin?" (Minutes, hours or days)      11/19/2018 4. PATTERN "Does the pain come and go, or has it been constant since it started?"  "Does it get worse with exertion?"      Intermittent; not associated with exertion 5. DURATION: "How long does it last" (e.g., seconds, minutes, hours)     seconds 6. SEVERITY: "How bad is the pain?"  (e.g., Scale 1-10; mild, moderate, or severe)    - MILD (1-3): doesn't interfere with normal activities     - MODERATE (4-7): interferes with normal activities or awakens from sleep    - SEVERE (8-10): excruciating pain, unable to do any normal activities       1-2 out of 10  7. CARDIAC RISK FACTORS: "Do you have any history of heart problems or risk factors for heart disease?" (e.g., prior heart attack, angina; high blood pressure, diabetes, being overweight, high cholesterol, smoking, or strong family history of heart disease)     High blood pressure and Family history of high blood pressure 8. PULMONARY RISK FACTORS: "Do you have any history of lung disease?"  (e.g., blood clots in lung, asthma, emphysema, birth control pills)     no 9. CAUSE: "What do you think is  causing the chest pain?"     stress 10. OTHER SYMPTOMS: "Do you have any other symptoms?" (e.g., dizziness, nausea, vomiting, sweating, fever, difficulty breathing, cough)       no 11. PREGNANCY: "Is there any chance you are pregnant?" "When was your last menstrual period?"       Yes LMP 10/30/2018  Protocols used: CHEST PAIN-A-AH

## 2018-12-01 ENCOUNTER — Encounter: Payer: Self-pay | Admitting: Adult Health

## 2018-12-01 ENCOUNTER — Ambulatory Visit (INDEPENDENT_AMBULATORY_CARE_PROVIDER_SITE_OTHER): Payer: Self-pay | Admitting: Adult Health

## 2018-12-01 VITALS — BP 128/84 | Temp 98.7°F | Wt 131.0 lb

## 2018-12-01 DIAGNOSIS — F419 Anxiety disorder, unspecified: Secondary | ICD-10-CM

## 2018-12-01 DIAGNOSIS — R030 Elevated blood-pressure reading, without diagnosis of hypertension: Secondary | ICD-10-CM

## 2018-12-01 NOTE — Progress Notes (Signed)
Subjective:    Patient ID: Courtney [name], female    DOB: 1991/01/24, 28 y.o.   MRN: [id number]  HPI 28 year old female who  has a past medical history of Anemia, Migraine, and Trichomonal vaginitis during pregnancy.  She presents to the office today for concern of elevated blood pressure reading at home. She has been using her home blood pressure monitor and has been getting readings between 110's-130's/80-100. She denies headaches, blurred vision, light headedness, or syncope.   She has three young children at home and feels as though the anxiety of this may be a contributing cause to her slightly elevated blood pressure readings.   She is trying to eat healthy but finds it hard to do so and is also not exercising but has ordered a treadmill.   BP Readings from Last 3 Encounters:  12/01/18 128/84  11/25/18 110/90  06/09/18 120/78    Review of Systems See HPI   Past Medical History:  Diagnosis Date  . Anemia   . Migraine   . Trichomonal vaginitis during pregnancy     Social History   Socioeconomic History  . Marital status: Married    Spouse name: Not on file  . Number of children: Not on file  . Years of education: Not on file  . Highest education level: Not on file  Occupational History  . Not on file  Social Needs  . Financial resource strain: Not on file  . Food insecurity:    Worry: Not on file    Inability: Not on file  . Transportation needs:    Medical: Not on file    Non-medical: Not on file  Tobacco Use  . Smoking status: Never Smoker  . Smokeless tobacco: Never Used  Substance and Sexual Activity  . Alcohol use: No  . Drug use: No  . Sexual activity: Not Currently    Birth control/protection: None  Lifestyle  . Physical activity:    Days per week: Not on file    Minutes per session: Not on file  . Stress: Not on file  Relationships  . Social connections:    Talks on phone: Not on file    Gets together: Not on file    Attends religious  service: Not on file    Active member of club or organization: Not on file    Attends meetings of clubs or organizations: Not on file    Relationship status: Not on file  . Intimate partner violence:    Fear of current or ex partner: Not on file    Emotionally abused: Not on file    Physically abused: Not on file    Forced sexual activity: Not on file  Other Topics Concern  . Not on file  Social History Narrative   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    Married     Past Surgical History:  Procedure Laterality Date  . NO PAST SURGERIES    . WISDOM TOOTH EXTRACTION      Family History  Problem Relation Age of Onset  . Diabetes Mother   . Multiple sclerosis Mother   . Fibromyalgia Mother   . Kidney disease Father   . Hypertension Father   . Diabetes Maternal Uncle   . Diabetes Maternal Grandmother   . Cancer Paternal Grandfather        lung  . Autism Paternal Uncle        half-uncle ( husbands brother)   . Hypertension Maternal  Grandfather   . Heart attack Maternal Grandfather   . Stroke Maternal Grandfather   . Hypertension Paternal Grandmother   . Stroke Paternal Grandmother   . Heart disease Paternal Grandmother     No Known Allergies  No current outpatient medications on file prior to visit.   No current facility-administered medications on file prior to visit.     BP 128/84   Temp 98.7 F (37.1 C)   Wt 131 lb (59.4 kg)   BMI 23.21 kg/m       Objective:   Physical Exam Vitals signs and nursing note reviewed.  Constitutional:      Appearance: Normal appearance.  Cardiovascular:     Rate and Rhythm: Normal rate and regular rhythm.     Pulses: Normal pulses.     Heart sounds: Normal heart sounds.  Pulmonary:     Effort: Pulmonary effort is normal.     Breath sounds: Normal breath sounds.  Skin:    General: Skin is warm and dry.     Capillary Refill: Capillary refill takes less than 2 seconds.  Neurological:     General: No focal deficit present.      Mental Status: She is alert and oriented to person, place, and time.  Psychiatric:        Mood and Affect: Mood normal.        Behavior: Behavior normal.        Thought Content: Thought content normal.        Judgment: Judgment normal.        Assessment & Plan:  We talked about blood pressure and anxiety. I think partially her machine is old and is not getting accurate blood pressure readings. We discussed anxiety medication but I would like her to try exercising more often first, she is ok with this plan   Advised follow up if no improvement   Shirline Frees, NP

## 2019-03-08 ENCOUNTER — Encounter (HOSPITAL_COMMUNITY): Payer: Self-pay

## 2019-03-08 LAB — OB RESULTS CONSOLE ANTIBODY SCREEN: Antibody Screen: NEGATIVE

## 2019-03-08 LAB — OB RESULTS CONSOLE HEPATITIS B SURFACE ANTIGEN: Hepatitis B Surface Ag: NEGATIVE

## 2019-03-08 LAB — OB RESULTS CONSOLE GC/CHLAMYDIA
Chlamydia: NEGATIVE
Gonorrhea: NEGATIVE

## 2019-03-08 LAB — OB RESULTS CONSOLE RPR: RPR: NONREACTIVE

## 2019-03-08 LAB — OB RESULTS CONSOLE ABO/RH: RH Type: POSITIVE

## 2019-03-08 LAB — OB RESULTS CONSOLE RUBELLA ANTIBODY, IGM: Rubella: IMMUNE

## 2019-03-08 LAB — OB RESULTS CONSOLE HIV ANTIBODY (ROUTINE TESTING): HIV: NONREACTIVE

## 2019-07-26 IMAGING — US US OB COMP LESS 14 WK
1 series · 15 of 28 positions shown · non-contrast
Comparison: None.

CLINICAL DATA: Spotting

EXAM:
TWIN OBSTETRIC <14WK US AND TRANSVAGINAL OB US

[Series 1: us ob comp less 14 wk · 15 of 73 slices shown]
[im 1/73]
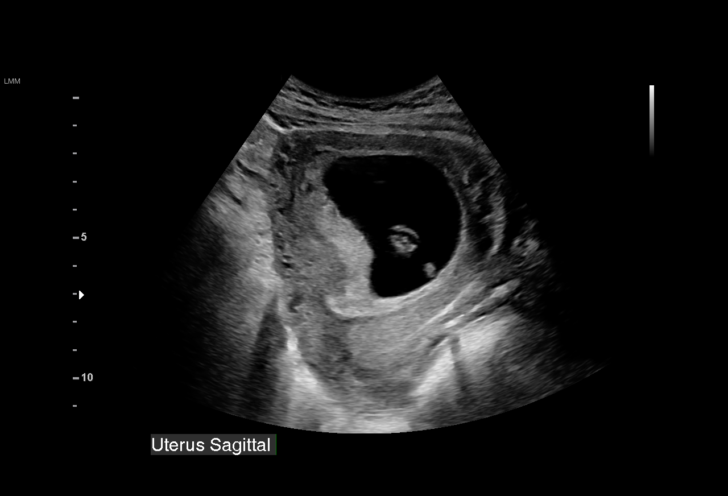
[im 6/73]
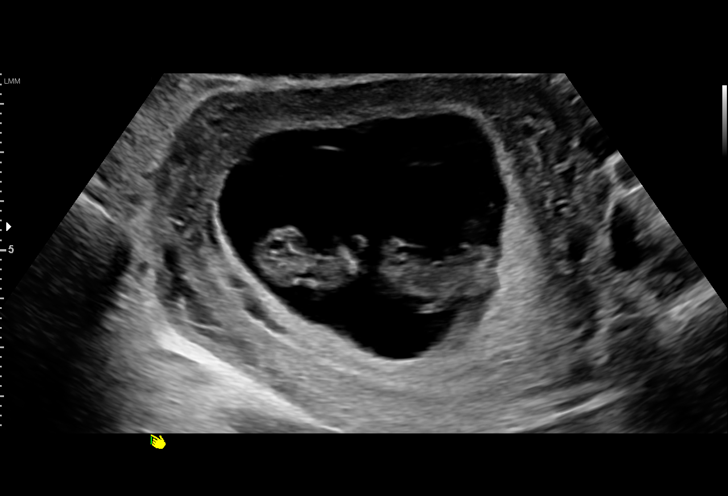
[im 11/73]
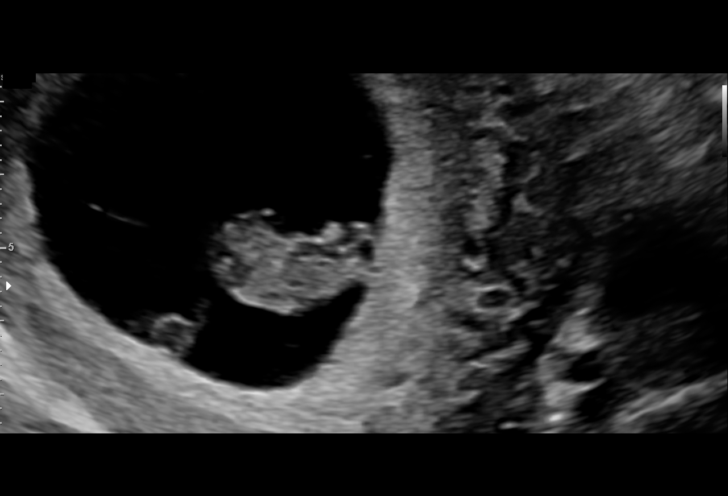
[im 17/73]
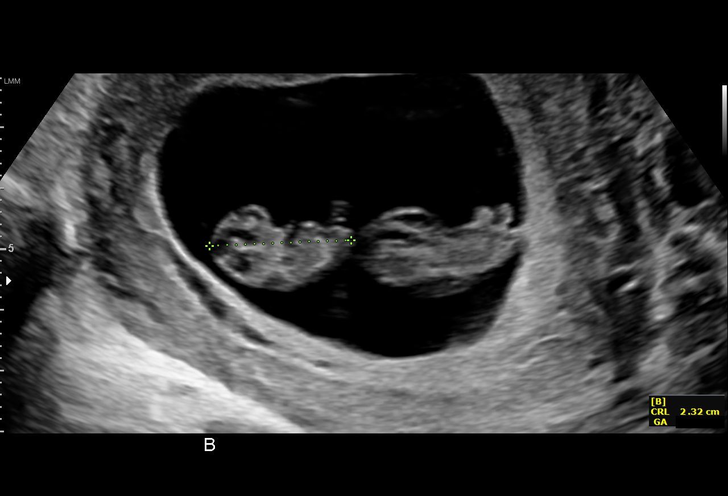
[im 22/73]
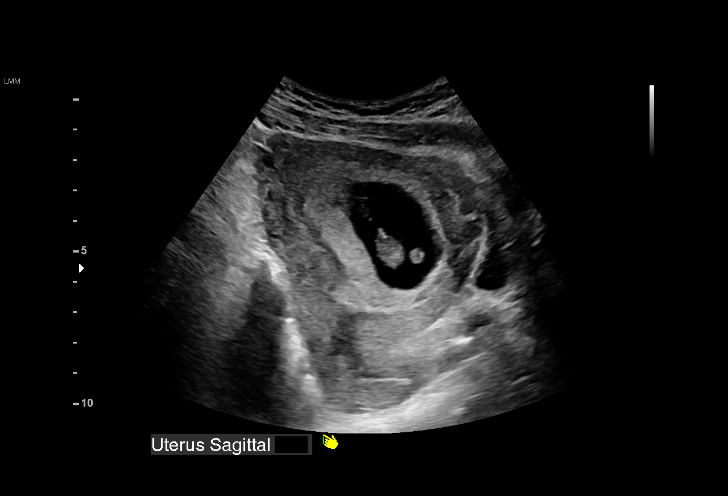
[im 27/73]
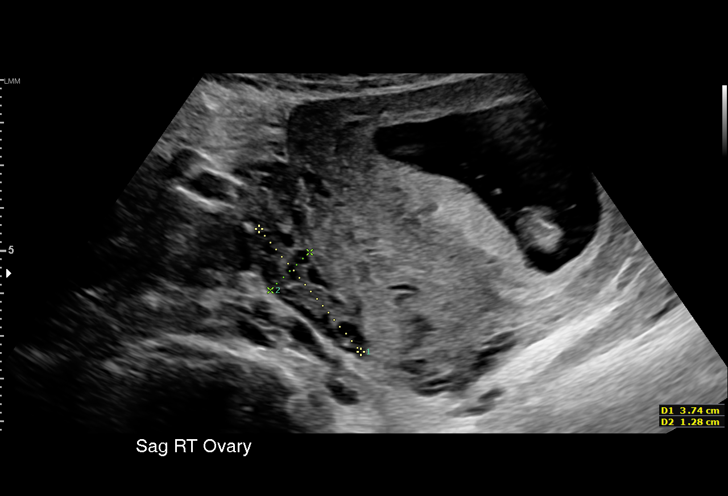
[im 33/73]
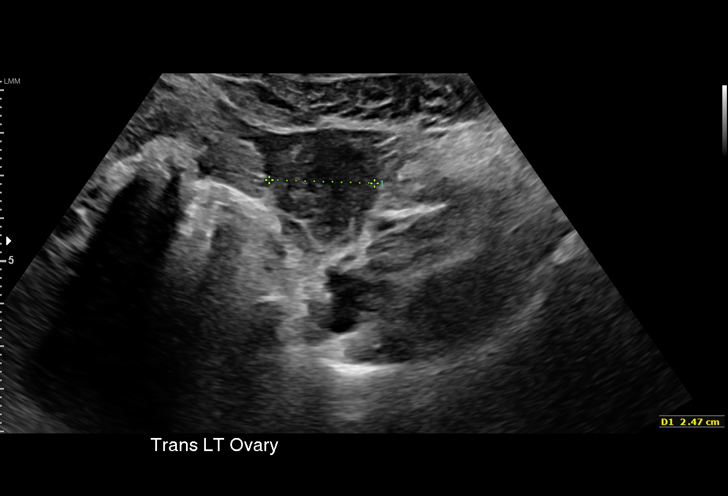
[im 38/73]
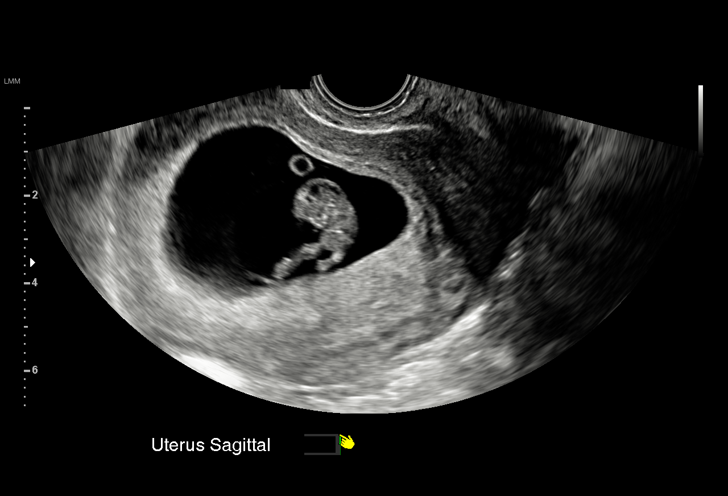
[im 41/73]
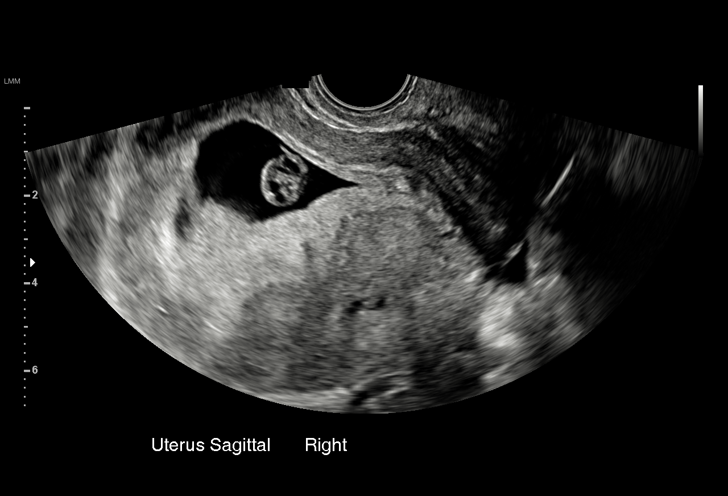
[im 46/73]
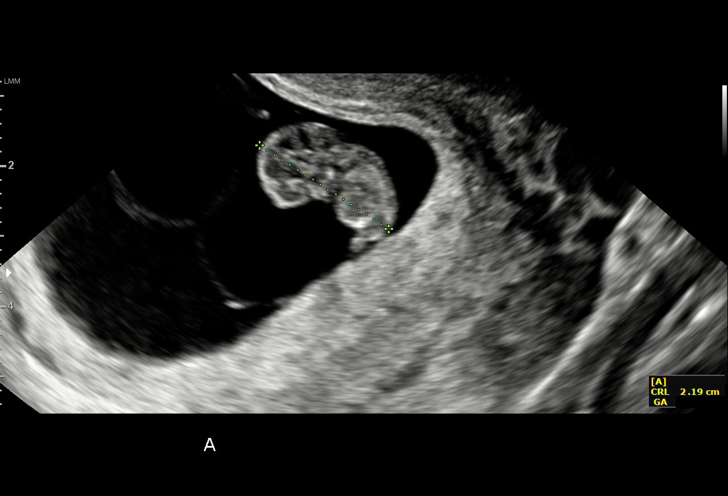
[im 51/73]
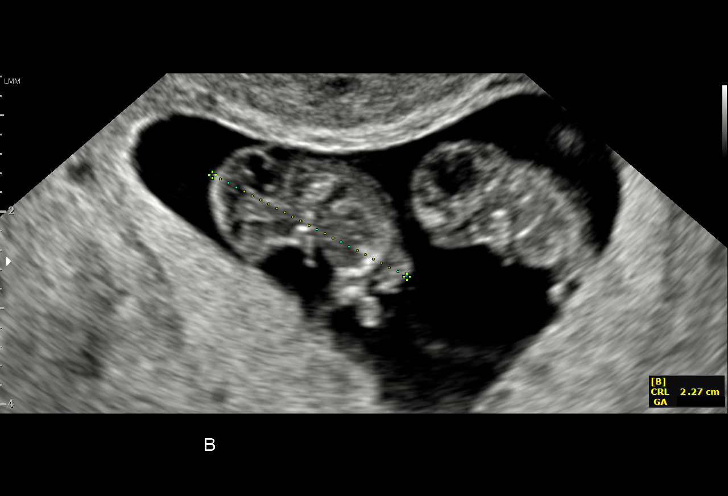
[im 57/73]
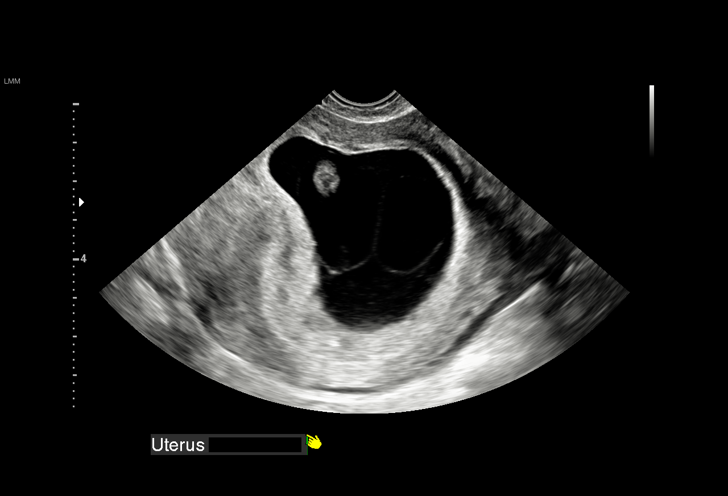
[im 62/73]
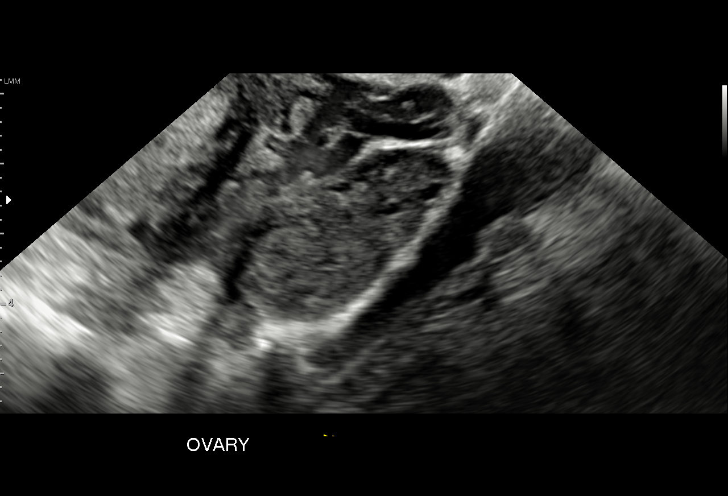
[im 67/73]
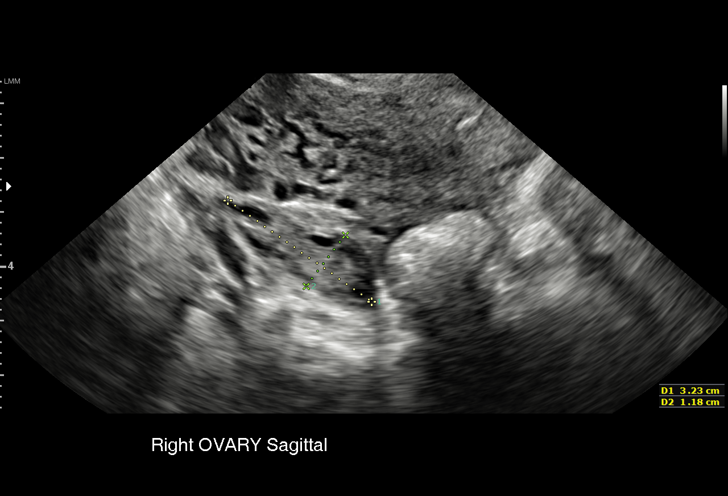
[im 73/73]
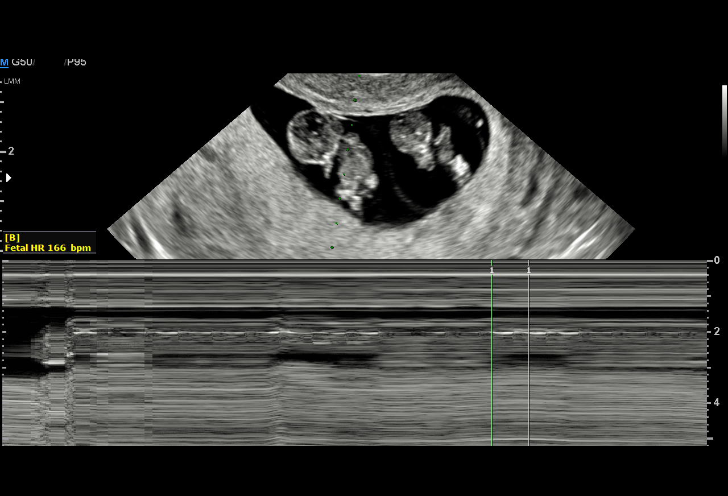

[15 of 28 positions shown; findings below may reference images not displayed]

FINDINGS: Number of IUPs:  2

Chorionicity/Amnionicity:  Monochorionic diamniotic

TWIN 1

Yolk sac:  Visualized

Embryo:  Visualized

Cardiac Activity: Visualized

Heart Rate: 183 bpm

MSD:   mm    w     d

CRL:  22.0  mm   8 w 5 d                  US EDC: 04/11/2018

TWIN 2

Yolk sac:  Visualized

Embryo:  Visualized

Cardiac Activity: Visualized

Heart Rate: 166 bpm

MSD:   mm    w     d

CRL:  22.9  mm   8 w 6 d                  US EDC: 04/10/2018

Subchorionic hemorrhage:  None visualized.

Maternal uterus/adnexae: No adnexal masses or free fluid
IMPRESSION: Monochorionic diamniotic intrauterine twin gestation. No acute
maternal findings.

## 2019-08-31 ENCOUNTER — Other Ambulatory Visit (HOSPITAL_COMMUNITY): Payer: Self-pay | Admitting: Obstetrics and Gynecology

## 2019-08-31 DIAGNOSIS — Z363 Encounter for antenatal screening for malformations: Secondary | ICD-10-CM

## 2019-08-31 DIAGNOSIS — O365931 Maternal care for other known or suspected poor fetal growth, third trimester, fetus 1: Secondary | ICD-10-CM

## 2019-08-31 DIAGNOSIS — Z3A35 35 weeks gestation of pregnancy: Secondary | ICD-10-CM

## 2019-09-08 ENCOUNTER — Other Ambulatory Visit: Payer: Self-pay

## 2019-09-08 ENCOUNTER — Encounter (HOSPITAL_COMMUNITY): Payer: Self-pay | Admitting: *Deleted

## 2019-09-08 ENCOUNTER — Inpatient Hospital Stay (HOSPITAL_COMMUNITY)
Admission: AD | Admit: 2019-09-08 | Discharge: 2019-09-08 | Disposition: A | Discharge status: Home / Self Care | Payer: Medicaid Other | Attending: Obstetrics and Gynecology | Admitting: Obstetrics and Gynecology

## 2019-09-08 DIAGNOSIS — Z3A33 33 weeks gestation of pregnancy: Secondary | ICD-10-CM | POA: Diagnosis not present

## 2019-09-08 DIAGNOSIS — Z3689 Encounter for other specified antenatal screening: Secondary | ICD-10-CM | POA: Diagnosis not present

## 2019-09-08 DIAGNOSIS — O10013 Pre-existing essential hypertension complicating pregnancy, third trimester: Secondary | ICD-10-CM | POA: Diagnosis not present

## 2019-09-08 LAB — URINALYSIS, ROUTINE W REFLEX MICROSCOPIC
Bilirubin Urine: NEGATIVE
Glucose, UA: NEGATIVE mg/dL
Hgb urine dipstick: NEGATIVE
Ketones, ur: NEGATIVE mg/dL
Leukocytes,Ua: NEGATIVE
Nitrite: NEGATIVE
Protein, ur: NEGATIVE mg/dL
Specific Gravity, Urine: 1.005 (ref 1.005–1.030)
pH: 7 (ref 5.0–8.0)

## 2019-09-08 NOTE — MAU Note (Signed)
Pt was at prenatal visit today and was sent for NST d/t non reassuring fetal monitoring at the office. Denies LOF and VB. +FM

## 2019-09-08 NOTE — MAU Provider Note (Signed)
First Provider Initiated Contact with Patient 09/08/19 1111     S [name] is a 28 y.o. [phone] at [redacted]w[redacted]d who presents to MAU from clinic due to non-reactive NST.  Patient states she was told by clinic staff that they were "having a hard time" distinguishing between maternal and fetal heart rates.   She denies contraction pain, vaginal bleeding, leaking of fluid, decreased fetal movement, fever, falls, or recent illness.   Pregnancy is complicated by Chronic Hypertension, not on medication. Patient denies headache, visual disturbances, RUQ/epigastric pain, new onset swelling or weight gain.  She receives [facility] with [facility].  O BP 128/82   Pulse (!) 109   Temp 98.2 F (36.8 C) (Oral)   Resp 18   Ht 5\' 2"  (1.575 m)   Wt 65.3 kg   LMP 01/18/2019   SpO2 100% Comment: room air  BMI 26.32 kg/m    Physical Exam  Nursing note and vitals reviewed. Constitutional: She is oriented to person, place, and time. She appears well-developed and well-nourished.  Cardiovascular: Normal rate.  Respiratory: Effort normal.  GI:  Gravid  Musculoskeletal: Normal range of motion.  Neurological: She is alert and oriented to person, place, and time.  Skin: Skin is warm and dry.  Psychiatric: She has a normal mood and affect. Her behavior is normal. Judgment and thought content normal.   A Medical screening exam complete Multiple accelerations in first few minutes of tracing Reactive NST  P Discharge from MAU in stable condition Patient may return to MAU as needed for pregnancy related complaints  Mallie Snooks, CNM  09/08/2019 12:00 PM

## 2019-09-08 NOTE — Discharge Instructions (Signed)

## 2019-09-21 ENCOUNTER — Other Ambulatory Visit: Payer: Self-pay

## 2019-09-21 ENCOUNTER — Encounter (HOSPITAL_COMMUNITY): Payer: Self-pay

## 2019-09-21 ENCOUNTER — Other Ambulatory Visit (HOSPITAL_COMMUNITY): Payer: Self-pay | Admitting: Obstetrics and Gynecology

## 2019-09-21 ENCOUNTER — Ambulatory Visit (HOSPITAL_BASED_OUTPATIENT_CLINIC_OR_DEPARTMENT_OTHER): Payer: Medicaid Other | Admitting: Obstetrics

## 2019-09-21 ENCOUNTER — Ambulatory Visit (HOSPITAL_COMMUNITY): Payer: Medicaid Other | Admitting: *Deleted

## 2019-09-21 ENCOUNTER — Ambulatory Visit (HOSPITAL_COMMUNITY)
Admission: RE | Admit: 2019-09-21 | Discharge: 2019-09-21 | Disposition: A | Discharge status: Home / Self Care | Payer: Medicaid Other | Source: Ambulatory Visit | Attending: Obstetrics and Gynecology | Admitting: Obstetrics and Gynecology

## 2019-09-21 ENCOUNTER — Other Ambulatory Visit (HOSPITAL_COMMUNITY): Payer: Self-pay | Admitting: *Deleted

## 2019-09-21 VITALS — BP 132/79 | HR 99 | Temp 97.3°F

## 2019-09-21 DIAGNOSIS — O365931 Maternal care for other known or suspected poor fetal growth, third trimester, fetus 1: Secondary | ICD-10-CM

## 2019-09-21 DIAGNOSIS — O365130 Maternal care for known or suspected placental insufficiency, third trimester, not applicable or unspecified: Secondary | ICD-10-CM

## 2019-09-21 DIAGNOSIS — O365930 Maternal care for other known or suspected poor fetal growth, third trimester, not applicable or unspecified: Secondary | ICD-10-CM | POA: Diagnosis present

## 2019-09-21 DIAGNOSIS — Z3A35 35 weeks gestation of pregnancy: Secondary | ICD-10-CM

## 2019-09-21 DIAGNOSIS — Z363 Encounter for antenatal screening for malformations: Secondary | ICD-10-CM

## 2019-09-21 DIAGNOSIS — O09293 Supervision of pregnancy with other poor reproductive or obstetric history, third trimester: Secondary | ICD-10-CM

## 2019-09-21 DIAGNOSIS — O365990 Maternal care for other known or suspected poor fetal growth, unspecified trimester, not applicable or unspecified: Secondary | ICD-10-CM

## 2019-09-21 NOTE — Progress Notes (Signed)
This patient was seen in consultation due to fetal growth restriction noted on a recent ultrasound performed in your office.  The patient denies any significant past medical history and denies any problems in her current pregnancy.  She does not have any history of high blood pressure or diabetes.  The patient has 3 other children who were all delivered at between 37 to 39 weeks weighing 5 pounds 4 ounces to 5 pounds 10 ounces.  On today's exam, the overall EFW is 4 pounds 11 ounces which is at the 5th percentile for her gestational age, indicating fetal growth restriction.  There was normal amniotic fluid noted today.  Doppler studies of the umbilical arteries performed today due to fetal growth restriction showed a normal S/D ratio of 2.02.  There were no signs of absent or reversed end-diastolic flow.    A biophysical profile performed today was 8 out of 8.  The patient was reassured that as she tends to have smaller children, that this baby is most likely constitutionally small.  Due to possible fetal growth restriction, she should continue twice weekly fetal testing in your office until delivery.  A follow-up umbilical artery Doppler study was scheduled in our office in 1 week.  Due to fetal growth restriction, delivery should probably occur at between 37 to 38 weeks.  The patient was reassured that based on the EFW obtained today, that this baby will most likely weigh similar to her 3 other children.    A total of 30 minutes was spent counseling and coordinating the care for this patient.  Greater than 50% of the time was spent in direct face-to-face contact.

## 2019-09-26 LAB — OB RESULTS CONSOLE GBS: GBS: NEGATIVE

## 2019-09-29 ENCOUNTER — Other Ambulatory Visit: Payer: Self-pay

## 2019-09-29 ENCOUNTER — Encounter (HOSPITAL_COMMUNITY): Payer: Self-pay

## 2019-09-29 ENCOUNTER — Ambulatory Visit (HOSPITAL_COMMUNITY)
Admission: RE | Admit: 2019-09-29 | Discharge: 2019-09-29 | Disposition: A | Discharge status: Home / Self Care | Payer: Medicaid Other | Source: Ambulatory Visit | Attending: Obstetrics and Gynecology | Admitting: Obstetrics and Gynecology

## 2019-09-29 ENCOUNTER — Ambulatory Visit (HOSPITAL_COMMUNITY): Payer: Medicaid Other | Admitting: *Deleted

## 2019-09-29 VITALS — BP 138/85 | HR 99 | Temp 98.0°F

## 2019-09-29 DIAGNOSIS — Z3A36 36 weeks gestation of pregnancy: Secondary | ICD-10-CM

## 2019-09-29 DIAGNOSIS — O365931 Maternal care for other known or suspected poor fetal growth, third trimester, fetus 1: Secondary | ICD-10-CM | POA: Diagnosis not present

## 2019-09-29 DIAGNOSIS — O365930 Maternal care for other known or suspected poor fetal growth, third trimester, not applicable or unspecified: Secondary | ICD-10-CM

## 2019-09-29 DIAGNOSIS — O365990 Maternal care for other known or suspected poor fetal growth, unspecified trimester, not applicable or unspecified: Secondary | ICD-10-CM

## 2019-09-29 DIAGNOSIS — O09293 Supervision of pregnancy with other poor reproductive or obstetric history, third trimester: Secondary | ICD-10-CM | POA: Diagnosis not present

## 2019-10-04 ENCOUNTER — Other Ambulatory Visit: Payer: Self-pay | Admitting: Obstetrics and Gynecology

## 2019-10-04 ENCOUNTER — Encounter (HOSPITAL_COMMUNITY): Payer: Self-pay | Admitting: *Deleted

## 2019-10-04 ENCOUNTER — Telehealth (HOSPITAL_COMMUNITY): Payer: Self-pay | Admitting: *Deleted

## 2019-10-04 NOTE — Telephone Encounter (Signed)
Preadmission screen  

## 2019-10-09 ENCOUNTER — Other Ambulatory Visit (HOSPITAL_COMMUNITY)
Admission: RE | Admit: 2019-10-09 | Discharge: 2019-10-09 | Disposition: A | Discharge status: Home / Self Care | Payer: Medicaid Other | Source: Ambulatory Visit | Attending: Obstetrics & Gynecology | Admitting: Obstetrics & Gynecology

## 2019-10-09 LAB — SARS CORONAVIRUS 2 (TAT 6-24 HRS): SARS Coronavirus 2: NEGATIVE

## 2019-10-11 ENCOUNTER — Inpatient Hospital Stay (HOSPITAL_COMMUNITY): Payer: Medicaid Other | Admitting: Anesthesiology

## 2019-10-11 ENCOUNTER — Inpatient Hospital Stay (HOSPITAL_COMMUNITY)
Admission: AD | Admit: 2019-10-11 | Discharge: 2019-10-12 | DRG: 807 | Disposition: A | Discharge status: Home / Self Care | Payer: Medicaid Other | Attending: Obstetrics and Gynecology | Admitting: Obstetrics and Gynecology

## 2019-10-11 ENCOUNTER — Other Ambulatory Visit: Payer: Self-pay

## 2019-10-11 ENCOUNTER — Inpatient Hospital Stay (HOSPITAL_COMMUNITY): Payer: Medicaid Other

## 2019-10-11 ENCOUNTER — Encounter (HOSPITAL_COMMUNITY): Payer: Self-pay | Admitting: Obstetrics & Gynecology

## 2019-10-11 DIAGNOSIS — O365930 Maternal care for other known or suspected poor fetal growth, third trimester, not applicable or unspecified: Secondary | ICD-10-CM | POA: Diagnosis present

## 2019-10-11 DIAGNOSIS — Z3A38 38 weeks gestation of pregnancy: Secondary | ICD-10-CM | POA: Diagnosis not present

## 2019-10-11 DIAGNOSIS — Z20828 Contact with and (suspected) exposure to other viral communicable diseases: Secondary | ICD-10-CM | POA: Diagnosis present

## 2019-10-11 DIAGNOSIS — O99344 Other mental disorders complicating childbirth: Secondary | ICD-10-CM | POA: Diagnosis present

## 2019-10-11 DIAGNOSIS — O365990 Maternal care for other known or suspected poor fetal growth, unspecified trimester, not applicable or unspecified: Secondary | ICD-10-CM | POA: Diagnosis present

## 2019-10-11 DIAGNOSIS — G43909 Migraine, unspecified, not intractable, without status migrainosus: Secondary | ICD-10-CM | POA: Diagnosis present

## 2019-10-11 DIAGNOSIS — Z3490 Encounter for supervision of normal pregnancy, unspecified, unspecified trimester: Secondary | ICD-10-CM | POA: Diagnosis present

## 2019-10-11 DIAGNOSIS — F419 Anxiety disorder, unspecified: Secondary | ICD-10-CM | POA: Diagnosis present

## 2019-10-11 DIAGNOSIS — Z8759 Personal history of other complications of pregnancy, childbirth and the puerperium: Secondary | ICD-10-CM | POA: Diagnosis present

## 2019-10-11 DIAGNOSIS — O134 Gestational [pregnancy-induced] hypertension without significant proteinuria, complicating childbirth: Secondary | ICD-10-CM | POA: Diagnosis present

## 2019-10-11 HISTORY — DX: Anxiety disorder, unspecified: F41.9

## 2019-10-11 HISTORY — DX: Migraine, unspecified, not intractable, without status migrainosus: G43.909

## 2019-10-11 LAB — COMPREHENSIVE METABOLIC PANEL
ALT: 11 U/L (ref 0–44)
AST: 17 U/L (ref 15–41)
Albumin: 3 g/dL — ABNORMAL LOW (ref 3.5–5.0)
Alkaline Phosphatase: 143 U/L — ABNORMAL HIGH (ref 38–126)
Anion gap: 9 (ref 5–15)
BUN: 5 mg/dL — ABNORMAL LOW (ref 6–20)
CO2: 20 mmol/L — ABNORMAL LOW (ref 22–32)
Calcium: 8.6 mg/dL — ABNORMAL LOW (ref 8.9–10.3)
Chloride: 104 mmol/L (ref 98–111)
Creatinine, Ser: 0.55 mg/dL (ref 0.44–1.00)
GFR calc Af Amer: >60 mL/min (ref >60)
GFR calc non Af Amer: >60 mL/min (ref >60)
Glucose, Bld: 86 mg/dL (ref 70–99)
Potassium: 3.2 mmol/L — ABNORMAL LOW (ref 3.5–5.1)
Sodium: 133 mmol/L — ABNORMAL LOW (ref 135–145)
Total Bilirubin: 0.4 mg/dL (ref 0.3–1.2)
Total Protein: 6.8 g/dL (ref 6.5–8.1)

## 2019-10-11 LAB — CBC
HCT: 34.8 % — ABNORMAL LOW (ref 36.0–46.0)
Hemoglobin: 11.7 g/dL — ABNORMAL LOW (ref 12.0–15.0)
MCH: 29.2 pg (ref 26.0–34.0)
MCHC: 33.6 g/dL (ref 30.0–36.0)
MCV: 86.8 fL (ref 80.0–100.0)
Platelets: 210 10*3/uL (ref 150–400)
RBC: 4.01 MIL/uL (ref 3.87–5.11)
RDW: 13 % (ref 11.5–15.5)
WBC: 8.9 10*3/uL (ref 4.0–10.5)
nRBC: 0 % (ref 0.0–0.2)

## 2019-10-11 LAB — TYPE AND SCREEN
ABO/RH(D): O POS
Antibody Screen: NEGATIVE

## 2019-10-11 LAB — PROTEIN / CREATININE RATIO, URINE
Creatinine, Urine: 83.36 mg/dL
Protein Creatinine Ratio: 0.08 mg/mg{Cre} (ref 0.00–0.15)
Total Protein, Urine: 7 mg/dL

## 2019-10-11 LAB — RPR: RPR Ser Ql: NONREACTIVE

## 2019-10-11 MED ORDER — TETANUS-DIPHTH-ACELL PERTUSSIS 5-2.5-18.5 LF-MCG/0.5 IM SUSP: 0.5000 mL | Freq: Once | INTRAMUSCULAR | Status: DC

## 2019-10-11 MED ORDER — TERBUTALINE SULFATE 1 MG/ML IJ SOLN: 0.2500 mg | Freq: Once | INTRAMUSCULAR | Status: DC | PRN

## 2019-10-11 MED ORDER — HYDROXYZINE HCL 50 MG PO TABS: 50.0000 mg | ORAL_TABLET | Freq: Four times a day (QID) | ORAL | Status: DC | PRN

## 2019-10-11 MED ORDER — ACETAMINOPHEN 325 MG PO TABS: 650.0000 mg | ORAL_TABLET | ORAL | Status: DC | PRN

## 2019-10-11 MED ORDER — OXYTOCIN 40 UNITS IN NORMAL SALINE INFUSION - SIMPLE MED: 2.5000 [IU]/h | INTRAVENOUS | Status: DC

## 2019-10-11 MED ORDER — OXYTOCIN 40 UNITS IN NORMAL SALINE INFUSION - SIMPLE MED
1.0000 m[IU]/min | INTRAVENOUS | Status: DC
  Administered 2019-10-11: 2 m[IU]/min via INTRAVENOUS
  Filled 2019-10-11: qty 1000

## 2019-10-11 MED ORDER — FENTANYL-BUPIVACAINE-NACL 0.5-0.125-0.9 MG/250ML-% EP SOLN
12.0000 mL/h | EPIDURAL | Status: DC | PRN
  Filled 2019-10-11: qty 250

## 2019-10-11 MED ORDER — LACTATED RINGERS IV SOLN
500.0000 mL | Freq: Once | INTRAVENOUS | Status: AC
  Administered 2019-10-11: 500 mL via INTRAVENOUS

## 2019-10-11 MED ORDER — MEASLES, MUMPS & RUBELLA VAC IJ SOLR: 0.5000 mL | Freq: Once | INTRAMUSCULAR | Status: DC

## 2019-10-11 MED ORDER — PRENATAL MULTIVITAMIN CH
1.0000 | ORAL_TABLET | Freq: Every day | ORAL | Status: DC
  Administered 2019-10-12: 1 via ORAL
  Filled 2019-10-11: qty 1

## 2019-10-11 MED ORDER — SOD CITRATE-CITRIC ACID 500-334 MG/5ML PO SOLN: 30.0000 mL | ORAL | Status: DC | PRN

## 2019-10-11 MED ORDER — LIDOCAINE-EPINEPHRINE (PF) 2 %-1:200000 IJ SOLN
INTRAMUSCULAR | Status: DC | PRN
  Administered 2019-10-11 (×2): 2 mL via EPIDURAL

## 2019-10-11 MED ORDER — ONDANSETRON HCL 4 MG/2ML IJ SOLN: 4.0000 mg | INTRAMUSCULAR | Status: DC | PRN

## 2019-10-11 MED ORDER — PHENYLEPHRINE 40 MCG/ML (10ML) SYRINGE FOR IV PUSH (FOR BLOOD PRESSURE SUPPORT)
80.0000 ug | PREFILLED_SYRINGE | INTRAVENOUS | Status: AC | PRN
  Administered 2019-10-11: 120 ug via INTRAVENOUS
  Administered 2019-10-11 (×2): 80 ug via INTRAVENOUS

## 2019-10-11 MED ORDER — DIBUCAINE (PERIANAL) 1 % EX OINT: 1.0000 "application " | TOPICAL_OINTMENT | CUTANEOUS | Status: DC | PRN

## 2019-10-11 MED ORDER — SODIUM CHLORIDE (PF) 0.9 % IJ SOLN
INTRAMUSCULAR | Status: DC | PRN
  Administered 2019-10-11: 12 mL/h via EPIDURAL

## 2019-10-11 MED ORDER — ONDANSETRON HCL 4 MG PO TABS: 4.0000 mg | ORAL_TABLET | ORAL | Status: DC | PRN

## 2019-10-11 MED ORDER — EPHEDRINE 5 MG/ML INJ
10.0000 mg | INTRAVENOUS | Status: DC | PRN
  Filled 2019-10-11: qty 10

## 2019-10-11 MED ORDER — ZOLPIDEM TARTRATE 5 MG PO TABS: 5.0000 mg | ORAL_TABLET | Freq: Every evening | ORAL | Status: DC | PRN

## 2019-10-11 MED ORDER — DIPHENHYDRAMINE HCL 25 MG PO CAPS: 25.0000 mg | ORAL_CAPSULE | Freq: Four times a day (QID) | ORAL | Status: DC | PRN

## 2019-10-11 MED ORDER — PHENYLEPHRINE 40 MCG/ML (10ML) SYRINGE FOR IV PUSH (FOR BLOOD PRESSURE SUPPORT)
80.0000 ug | PREFILLED_SYRINGE | INTRAVENOUS | Status: DC | PRN
  Administered 2019-10-11: 80 ug via INTRAVENOUS
  Filled 2019-10-11 (×2): qty 10

## 2019-10-11 MED ORDER — OXYTOCIN BOLUS FROM INFUSION
500.0000 mL | Freq: Once | INTRAVENOUS | Status: AC
  Administered 2019-10-11: 500 mL via INTRAVENOUS

## 2019-10-11 MED ORDER — IBUPROFEN 600 MG PO TABS
600.0000 mg | ORAL_TABLET | Freq: Four times a day (QID) | ORAL | Status: DC
  Administered 2019-10-11 – 2019-10-12 (×4): 600 mg via ORAL
  Filled 2019-10-11 (×4): qty 1

## 2019-10-11 MED ORDER — LIDOCAINE HCL (PF) 1 % IJ SOLN: 30.0000 mL | INTRAMUSCULAR | Status: DC | PRN

## 2019-10-11 MED ORDER — WITCH HAZEL-GLYCERIN EX PADS: 1.0000 "application " | MEDICATED_PAD | CUTANEOUS | Status: DC | PRN

## 2019-10-11 MED ORDER — ONDANSETRON HCL 4 MG/2ML IJ SOLN: 4.0000 mg | Freq: Four times a day (QID) | INTRAMUSCULAR | Status: DC | PRN

## 2019-10-11 MED ORDER — BENZOCAINE-MENTHOL 20-0.5 % EX AERO: 1.0000 "application " | INHALATION_SPRAY | CUTANEOUS | Status: DC | PRN

## 2019-10-11 MED ORDER — DIPHENHYDRAMINE HCL 50 MG/ML IJ SOLN: 12.5000 mg | INTRAMUSCULAR | Status: DC | PRN

## 2019-10-11 MED ORDER — LACTATED RINGERS IV SOLN: INTRAVENOUS | Status: DC

## 2019-10-11 MED ORDER — COCONUT OIL OIL: 1.0000 "application " | TOPICAL_OIL | Status: DC | PRN

## 2019-10-11 MED ORDER — LACTATED RINGERS IV SOLN
500.0000 mL | INTRAVENOUS | Status: DC | PRN
  Administered 2019-10-11: 500 mL via INTRAVENOUS

## 2019-10-11 MED ORDER — SENNOSIDES-DOCUSATE SODIUM 8.6-50 MG PO TABS
2.0000 | ORAL_TABLET | ORAL | Status: DC
  Administered 2019-10-12: 2 via ORAL
  Filled 2019-10-11: qty 2

## 2019-10-11 MED ORDER — SIMETHICONE 80 MG PO CHEW: 80.0000 mg | CHEWABLE_TABLET | ORAL | Status: DC | PRN

## 2019-10-11 MED ORDER — EPHEDRINE 5 MG/ML INJ
10.0000 mg | INTRAVENOUS | Status: DC | PRN
  Administered 2019-10-11: 10 mg via INTRAVENOUS

## 2019-10-11 NOTE — Lactation Note (Signed)
This note was copied from a baby's chart. Lactation Consultation Note  Patient Name: Courtney [name] Date: 10/11/2019 Reason for consult: Initial assessment;Early term 37-38.6wks;Infant < 6lbs  3 hours old ETI < 6 lbs who is being exclusively BF by his mother, she's a P3. Mom BF her first child for 8 months, and her twins for 3 months, she experienced some BF difficulties with her twins when it was time to keep up with BF and taking turns (who ate last), she ended up with mastitis. She's already familiar with hand expression and able to get colostrum when doing so. Mom has a Medela DEBP at home.  Baby's first serum glucose was 28 g./dl; RN Peter Congo has been very proactive and already set mom up with a DEBP, instructions, cleaning and storage were reviewed as well as milk storage guidelines. Baby's temperatures were dropping also, but noticed that dad had baby swaddled instead of doing STS. Explained to parents the importance of STS care when babies have hypothermia and hypoglycemia; they voiced understanding but kept baby swaddled.   Mom was pumping when entering the room and baby asleep on dad's arms. Asked mom to call for assistance when needed, but so far she has pumped at least 5 ml (enough to complete a feeding) of EBM. Reviewed LPI policy due to baby's birth weight, normal newborn behavior and feeding cues. Mom open to supplement with a spoon on day 1 or with a bottle and a slow flow nipple as an alternative.   Parents understand that if mom is unable to meet volumes required for supplementation baby may have to get some formula (RN Peter Congo offered donor milk, but they refused, mom came as breast/formula). Parents didn't participated in the [facility] program for this pregnancy, Similac 22 calorie will be the formula of choice if needed.  Feeding plan:  1. Encouraged mom to feed baby STS 8-12 times/24 hours or sooner if feeding cues are present 2. Mom will pump every 3 hours for 15 minutes,  at least 8 pumping sessions/24 hours 3. Parents will supplement baby with EBM/formula every 3 hours starting with 5 ml today according to LPI policy. Parents aware that amounts will increase every 24 hours  BF brochure, BF resources, feeding diary and LPI handout were reviewed. Parents reported all questions and concerns were answered, they're both aware of [facility] [place] OP services and will call PRN.   Maternal Data Formula Feeding for Exclusion: Yes Reason for exclusion: Mother's choice to formula and breast feed on admission Has patient been taught Hand Expression?: Yes Does the patient have breastfeeding experience prior to this delivery?: Yes  Feeding Feeding Type: Breast Fed  LATCH Score Latch: Grasps breast easily, tongue down, lips flanged, rhythmical sucking.  Audible Swallowing: Spontaneous and intermittent  Type of Nipple: Everted at rest and after stimulation  Comfort (Breast/Nipple): Soft / non-tender  Hold (Positioning): No assistance needed to correctly position infant at breast.  LATCH Score: 10  Interventions Interventions: Breast feeding basics reviewed;DEBP  Lactation Tools Discussed/Used Tools: Pump Breast pump type: Double-Electric Breast Pump WIC Program: No Pump Review: Setup, frequency, and cleaning;Milk Storage Initiated by:: RN Peter Congo Date initiated:: 10/11/19   Consult Status Consult Status: Follow-up Date: 10/12/19 Follow-up type: In-patient    Courtney [name] 10/11/2019, 5:32 PM

## 2019-10-11 NOTE — Progress Notes (Signed)
Patient ID: Courtney [name], female   DOB: Jul 04, 1991, 28 y.o.   MRN: [id number]  Late entry Cat 1 toco q 2 min cx 5/100/-1 AROM bloody  IUPC placed decrease pitocin if able Suspect abruption will monitor closely

## 2019-10-11 NOTE — Anesthesia Preprocedure Evaluation (Signed)
Anesthesia Evaluation  Patient identified by MRN, date of birth, ID band Patient awake    Reviewed: Allergy & Precautions, NPO status , Patient's Chart, lab work & pertinent test results  Airway Mallampati: II  TM Distance: >3 FB Neck ROM: Full    Dental no notable dental hx.    Pulmonary neg pulmonary ROS,    Pulmonary exam normal breath sounds clear to auscultation       Cardiovascular hypertension, Normal cardiovascular exam Rhythm:Regular Rate:Normal     Neuro/Psych  Headaches, negative psych ROS   GI/Hepatic negative GI ROS, Neg liver ROS,   Endo/Other  negative endocrine ROS  Renal/GU negative Renal ROS  negative genitourinary   Musculoskeletal negative musculoskeletal ROS (+)   Abdominal   Peds  Hematology negative hematology ROS (+)   Anesthesia Other Findings IOL for IUGR  Reproductive/Obstetrics (+) Pregnancy                             Anesthesia Physical Anesthesia Plan  ASA: III  Anesthesia Plan: Epidural   Post-op Pain Management:    Induction:   PONV Risk Score and Plan: Treatment may vary due to age or medical condition  Airway Management Planned: Natural Airway  Additional Equipment:   Intra-op Plan:   Post-operative Plan:   Informed Consent: I have reviewed the patients History and Physical, chart, labs and discussed the procedure including the risks, benefits and alternatives for the proposed anesthesia with the patient or authorized representative who has indicated his/her understanding and acceptance.       Plan Discussed with: Anesthesiologist  Anesthesia Plan Comments: (Patient identified. Risks, benefits, options discussed with patient including but not limited to bleeding, infection, nerve damage, paralysis, failed block, incomplete pain control, headache, blood pressure changes, nausea, vomiting, reactions to medication, itching, and post partum  back pain. Confirmed with bedside nurse the patient's most recent platelet count. Confirmed with the patient that they are not taking any anticoagulation, have any bleeding history or any family history of bleeding disorders. Patient expressed understanding and wishes to proceed. All questions were answered. )        Anesthesia Quick Evaluation

## 2019-10-11 NOTE — Anesthesia Procedure Notes (Signed)
Epidural Patient location during procedure: OB Start time: 10/11/2019 4:30 AM End time: 10/11/2019 4:45 AM  Staffing Anesthesiologist: Freddrick March, MD Performed: anesthesiologist   Preanesthetic Checklist Completed: patient identified, IV checked, risks and benefits discussed, monitors and equipment checked, pre-op evaluation and timeout performed  Epidural Patient position: sitting Prep: DuraPrep and site prepped and draped Patient monitoring: continuous pulse ox, blood pressure, heart rate and cardiac monitor Approach: midline Location: L3-L4 Injection technique: LOR air  Needle:  Needle type: Tuohy  Needle gauge: 17 G Needle length: 9 cm Needle insertion depth: 5 cm Catheter type: closed end flexible Catheter size: 19 Gauge Catheter at skin depth: 11 cm Test dose: negative  Assessment Sensory level: T8 Events: blood not aspirated, injection not painful, no injection resistance, no paresthesia and negative IV test  Additional Notes Patient identified. Risks/Benefits/Options discussed with patient including but not limited to bleeding, infection, nerve damage, paralysis, failed block, incomplete pain control, headache, blood pressure changes, nausea, vomiting, reactions to medication both or allergic, itching and postpartum back pain. Confirmed with bedside nurse the patient's most recent platelet count. Confirmed with patient that they are not currently taking any anticoagulation, have any bleeding history or any family history of bleeding disorders. Patient expressed understanding and wished to proceed. All questions were answered. Sterile technique was used throughout the entire procedure. Please see nursing notes for vital signs. Test dose was given through epidural catheter and negative prior to continuing to dose epidural or start infusion. Warning signs of high block given to the patient including shortness of breath, tingling/numbness in hands, complete motor block,  or any concerning symptoms with instructions to call for help. Patient was given instructions on fall risk and not to get out of bed. All questions and concerns addressed with instructions to call with any issues or inadequate analgesia.  Reason for block:procedure for pain

## 2019-10-11 NOTE — H&P (Signed)
OB ADMISSION/ HISTORY & PHYSICAL:  Admission Date: 10/11/2019 12:12 AM  Admit Diagnosis: IOL for IUGR @ 38+3  Courtney [name] is a 28 y.o. female [phone] [redacted]w[redacted]d presenting for IOL for IUGR. Endorses active FM, denies LOF and vaginal bleeding. Pregnancy complicated by Hx of SGA infant, GHTN w/o meds, and anxiety. IOL plan reviewed w/ pt. Pt is contracting too frequently for Cytotec and pt declines balloon placement D/T trauma w/ cervical balloon w/ first birth. Pt request early epidural to manage pain if cervical balloon placement is needed. Pt would prefer Pitocin instead of balloon.   History of current pregnancy: [id number]   Patient entered care with [facility] at 12 weeks.   EDC of 10/25/19 was established by 7wk U/S.   Anatomy scan:  19+6 weeks, with normal findings and anterior placenta.   Antenatal testing: IUGR started at 32 weeks, growth [place] every 4 wks starting @ 27+5 Last evaluation: EFW 13% @ 34w 6d @ 35w 6d, vertex AFI 19.1, BPP 8/8, umbilical artery dopplers negative for absent or reversed flow. MFM rec IOL for IUGR between 37-39 wks  Patient Active Problem List   Diagnosis Date Noted  . Chronic hypertension during pregnancy, antepartum 09/22/2017  . Supervision of high risk pregnancy, antepartum 09/22/2017  . History of prior pregnancy with SGA newborn 09/22/2017    Prenatal Labs: ABO, Rh: O/Positive/-- (05/20 0000) Antibody: Negative (05/20 0000) Rubella: Immune (05/20 0000)  RPR: Nonreactive (05/20 0000)  HBsAg: Negative (05/20 0000)  HIV: Non-reactive (05/20 0000)  GBS: Negative/-- (12/08 0000)  GC/CHL: negative/negative Genetics: low-risk female    OB History  Gravida Para Term Preterm AB Living  4 2 2   1 3   SAB TAB Ectopic Multiple Live Births  1     1 3     # Outcome Date GA Lbr Len/2nd Weight Sex Delivery Anes PTL Lv  4 Current           3A Term 03/20/18 [redacted]w[redacted]d / 00:11 2380 g M Vag-Spont EPI  LIV  3B Term 03/20/18 [redacted]w[redacted]d / 00:15 2400 g M Vag-Spont EPI  LIV  2 Term  02/14/15 [redacted]w[redacted]d 02:47 / 00:09 2575 g F Vag-Spont EPI  LIV  1 SAB             Medical / Surgical History: Past medical history:  Past Medical History:  Diagnosis Date  . Anemia   . Migraine   . Trichomonal vaginitis during pregnancy     Past surgical history:  Past Surgical History:  Procedure Laterality Date  . NO PAST SURGERIES    . WISDOM TOOTH EXTRACTION     Family History:  Family History  Problem Relation Age of Onset  . Diabetes Mother   . Multiple sclerosis Mother   . Fibromyalgia Mother   . Kidney disease Father   . Hypertension Father   . Diabetes Maternal Uncle   . Diabetes Maternal Grandmother   . Cancer Paternal Grandfather        lung  . Autism Paternal Uncle        half-uncle ( husbands brother)   . Hypertension Maternal Grandfather   . Heart attack Maternal Grandfather   . Stroke Maternal Grandfather   . Hypertension Paternal Grandmother   . Stroke Paternal Grandmother   . Heart disease Paternal Grandmother     Social History:  reports that she has never smoked. She has never used smokeless tobacco. She reports that she does not drink alcohol or use drugs.  Allergies: Patient has  no known allergies.   Current Medications at time of admission:  Prior to Admission medications   Medication Sig Start Date End Date Taking? Authorizing Provider  aspirin 81 MG chewable tablet Chew by mouth daily.    [provider]  Prenatal Vit-Fe Fumarate-FA (PRENATAL MULTIVITAMIN) TABS tablet Take 1 tablet by mouth daily at 12 noon.    [provider]    Review of Systems: Constitutional: Negative   HENT: Negative   Eyes: Negative   Respiratory: Negative   Cardiovascular: Negative   Gastrointestinal: Negative  Genitourinary: negative for bloody show, negative for LOF   Musculoskeletal: Negative   Skin: Negative   Neurological: Negative   Endo/Heme/Allergies: Negative   Psychiatric/Behavioral: Negative    Physical Exam: VS: Blood pressure  (!) 141/85, pulse (!) 103, temperature 99.2 F (37.3 C), temperature source Oral, resp. rate 18, height 5\' 2"  (1.575 m), weight 65.3 kg, last menstrual period 01/15/2019, not currently breastfeeding. AAO x3, no signs of distress Cardiovascular: RRR Respiratory: Lung fields clear to ausculation GU/GI: Abdomen gravid, non-tender, non-distended, active FM, vertex and EFW 5# per Leopold's Extremities: no edema, negative for pain, tenderness, and cords  Cervical exam:  Dilation: 2 Effacement (%): 50 Cervical Position: Posterior Station: -3 Presentation: Vertex Exam by:: J Williams RN  FHR: baseline rate 140 / variability moderate / accelerations present / absent decelerations TOCO: 2-4 min   Prenatal Transfer Tool  Maternal Diabetes: No Genetic Screening: Normal Maternal Ultrasounds/Referrals: IUGR Fetal Ultrasounds or other Referrals:  None Maternal Substance Abuse:  No Significant Maternal Medications:  Meds include: Other: Baby aspirin  Significant Maternal Lab Results: Group B Strep negative    Assessment: 28 y.o. 34 [redacted]w[redacted]d IOL for IUGR  FHR category 1 GBS negative Pain management: Desires early epidural   Plan:  Admit to L&D Routine admission orders Pitocin per orders now Epidural PRN  Dr [redacted]w[redacted]d notified of admission and plan of care  Su Hilt CNM, MSN 10/11/2019 1:27 AM

## 2019-10-12 LAB — CBC
HCT: 31.8 % — ABNORMAL LOW (ref 36.0–46.0)
Hemoglobin: 10.5 g/dL — ABNORMAL LOW (ref 12.0–15.0)
MCH: 29.4 pg (ref 26.0–34.0)
MCHC: 33 g/dL (ref 30.0–36.0)
MCV: 89.1 fL (ref 80.0–100.0)
Platelets: 196 10*3/uL (ref 150–400)
RBC: 3.57 MIL/uL — ABNORMAL LOW (ref 3.87–5.11)
RDW: 13.3 % (ref 11.5–15.5)
WBC: 9.2 10*3/uL (ref 4.0–10.5)
nRBC: 0 % (ref 0.0–0.2)

## 2019-10-12 MED ORDER — IBUPROFEN 600 MG PO TABS: 600.0000 mg | ORAL_TABLET | Freq: Four times a day (QID) | ORAL | 0 refills | Status: DC

## 2019-10-12 MED ORDER — ACETAMINOPHEN 325 MG PO TABS: 650.0000 mg | ORAL_TABLET | ORAL | Status: DC | PRN

## 2019-10-12 NOTE — Anesthesia Postprocedure Evaluation (Signed)
Anesthesia Post Note  Patient: Courtney [name]  Procedure(s) Performed: AN AD HOC LABOR EPIDURAL     Patient location during evaluation: Mother Baby Anesthesia Type: Epidural Level of consciousness: awake and alert, oriented and patient cooperative Pain management: pain level controlled Vital Signs Assessment: post-procedure vital signs reviewed and stable Respiratory status: spontaneous breathing Cardiovascular status: stable Postop Assessment: no headache, epidural receding, patient able to bend at knees and no signs of nausea or vomiting Anesthetic complications: no Comments: Pt. States she is walking.  Pain score 2.     Last Vitals:  Vitals:   10/12/19 0055 10/12/19 0633  BP: 117/86 104/68  Pulse: 84 82  Resp: 16 16  Temp: 36.7 C 36.7 C  SpO2: 99% 100%    Last Pain:  Vitals:   10/12/19 1139  TempSrc:   PainSc: 0-No pain   Pain Goal: Patients Stated Pain Goal: 4 (10/12/19 0809)                 Rico Sheehan

## 2019-10-12 NOTE — Lactation Note (Signed)
This note was copied from a baby's chart. Lactation Consultation Note  Patient Name: Courtney [name] [id number] Date: 10/12/2019   [name] now 66 hours old. Early term and less than 6 pounds.   Parents ready to be discharged home to be with other children.  Mom reports she feels he is breastfeeding well.  Mom reports she is pumping with DEBP past around the three hour breastfeed and feeding him back whatever she gets.  Mom reports she usually gets 5 ml with pumping.  Mom reports she has a Medela DEBP for home use.  Urged her to continue to pump at home past around the 2-3 hour breastfeedings  as much as possible id did not get at least 5-7 ml to offer infant past breastfeedings to add some hand expression to feeding and pumping.  Mom reports she knows how to hand express.   Parents have [facility] breastfeeding Consultation Services handouts.  Urged mom to call lactation as needed. Maternal Data    Feeding    LATCH Score                   Interventions    Lactation Tools Discussed/Used     Consult Status      Aryav Wimberly Thompson Caul 10/12/2019, 2:04 PM

## 2019-10-12 NOTE — Progress Notes (Signed)
MOB was referred for history of depression/anxiety. * Referral screened out by Clinical Social Worker because none of the following criteria appear to apply: ~ History of anxiety/depression during this pregnancy, or of post-partum depression following prior delivery. ~ Diagnosis of anxiety and/or depression within last 3 years; no concerns noted in [facility] record. OR * MOB's symptoms currently being treated with medication and/or therapy.  Please contact the Clinical Social Worker if needs arise, by [facility] request, or if MOB scores greater than 9/yes to question 10 on Edinburgh Postpartum Depression Screen.  Laurey Arrow, MSW, LCSW Clinical Social Work [phone]

## 2019-10-12 NOTE — Discharge Summary (Signed)
OB Discharge Summary     Patient Name: Courtney [name] DOB: 11-13-1990 MRN: [id number]  Date of admission: 10/11/2019 Delivering MD: Crawford Givens   Date of discharge: 10/12/2019  Admitting diagnosis: Encounter for planned induction of labor [Z34.90] Intrauterine pregnancy: [redacted]w[redacted]d     Secondary diagnosis:  Active Problems:   Anxiety   Migraine hx   Encounter for induction of labor   IUGR   Encounter for planned induction of labor   SVD (12/23)  Additional problems:  Patient Active Problem List   Diagnosis Date Noted  . Anxiety 10/11/2019  . Migraine hx 10/11/2019  . Encounter for induction of labor 10/11/2019  . IUGR 10/11/2019  . Encounter for planned induction of labor 10/11/2019  . History of prior pregnancy with SGA newborn 09/22/2017        Discharge diagnosis: Term Pregnancy Delivered and Gestational Hypertension                                                                                                Post partum procedures:none  Augmentation: AROM and Pitocin  Complications: None  Hospital course:  Induction of Labor With Vaginal Delivery   28 y.o. yo [id number] at [redacted]w[redacted]d was admitted to the hospital 10/11/2019 for induction of labor.  Indication for induction: Gestational hypertension and IUGR.  Patient had an uncomplicated labor course as follows: Membrane Rupture Time/Date: 12:52 PM ,10/11/2019   Intrapartum Procedures: Episiotomy: None [1]                                         Lacerations:  None [1]  Patient had delivery of a Viable infant.  Information for the patient's newborn:  Jalysa, Swopes [[id number]]  Delivery Method: Vag-Spont    10/11/2019  Details of delivery can be found in separate delivery note.  Patient had a routine postpartum course. Patient is discharged home 10/12/19.  Physical exam  Vitals:   10/11/19 1532 10/11/19 1925 10/12/19 0055 10/12/19 0633  BP: 123/89 110/73 117/86 104/68  Pulse: 95 92 84 82  Resp: 16 16 16 16    Temp: 98.4 F (36.9 C) 98.4 F (36.9 C) 98 F (36.7 C) 98 F (36.7 C)  TempSrc: Oral Oral Oral Oral  SpO2:  98% 99% 100%  Weight:      Height:       General: alert, cooperative and no distress Lochia: appropriate Uterine Fundus: firm Incision: N/A DVT Evaluation: No evidence of DVT seen on physical exam. No cords or calf tenderness. No significant calf/ankle edema. Labs: Lab Results  Component Value Date   WBC 8.9 10/11/2019   HGB 11.7 (L) 10/11/2019   HCT 34.8 (L) 10/11/2019   MCV 86.8 10/11/2019   PLT 210 10/11/2019   CMP Latest Ref Rng & Units 10/11/2019  Glucose 70 - 99 mg/dL 86  BUN 6 - 20 mg/dL 5(L)  Creatinine 0.44 - 1.00 mg/dL 0.55  Sodium 135 - 145 mmol/L 133(L)  Potassium 3.5 - 5.1 mmol/L 3.2(L)  Chloride 98 -  111 mmol/L 104  CO2 22 - 32 mmol/L 20(L)  Calcium 8.9 - 10.3 mg/dL 7.8(G)  Total Protein 6.5 - 8.1 g/dL 6.8  Total Bilirubin 0.3 - 1.2 mg/dL 0.4  Alkaline Phos 38 - 126 U/L 143(H)  AST 15 - 41 U/L 17  ALT 0 - 44 U/L 11    Discharge instruction: per After Visit Summary and "Baby and Me Booklet".  After visit meds:  Allergies as of 10/12/2019   No Known Allergies     Medication List    STOP taking these medications   aspirin 81 MG chewable tablet     TAKE these medications   acetaminophen 325 MG tablet Commonly known as: Tylenol Take 2 tablets (650 mg total) by mouth every 4 (four) hours as needed (for pain scale < 4).   ibuprofen 600 MG tablet Commonly known as: ADVIL Take 1 tablet (600 mg total) by mouth every 6 (six) hours.   prenatal multivitamin Tabs tablet Take 1 tablet by mouth daily at 12 noon.       Diet: routine diet  Activity: Advance as tolerated. Pelvic rest for 6 weeks.   Outpatient follow NF:[facility] office to make appointment for BP check in 1 week and then return to office in 6 weeks for routine postpartum visit.  Follow up Appt:No future appointments. Follow up Visit:No follow-ups on file.  Postpartum  contraception: Progesterone only pills and IUD considering IUD after breastfeeding. Wants mini-pill at 6 weeks  Newborn Data: Live born female  Birth Weight: 5 lb 11.9 oz (2605 g) APGAR: 9, 9  Newborn Delivery   Birth date/time: 10/11/2019 13:35:00 Delivery type: Vaginal, Spontaneous      Baby Feeding: Bottle and Breast Disposition:home with mother   10/12/2019 Roma Schanz, CNM

## 2019-10-16 LAB — SURGICAL PATHOLOGY

## 2019-10-19 ENCOUNTER — Telehealth (HOSPITAL_COMMUNITY): Payer: Self-pay | Admitting: Lactation Services

## 2019-10-19 NOTE — Telephone Encounter (Signed)
Outgoing message - Lactation phone call.  Mom had called with breast feeding questions due to sore nipples and prolonged engorgement. Baby ET ( 38 weeks and Birth weight 5-11 oz , last weight 5-9 oz.  Per mom the right nipple sorer compared to the left nipple.  LC recommended giving the right nipple a break for several feedings today and feed on the left breast.  If the breast are to full to start and areola edema release enough off so the nipple areola complex compressible for a deeper latch.  Feed for 15 - 20 mins making sure to work with the baby to open wide and latch and then PACE feed the baby at least 30 ml of the EBM . Post pump both breast with a dab of coconut oil to decrease the friction and help the soreness decrease.  Once the right nipple has healed - start to re-latch .  Per mom has a DEBP at home, ( Spivey with #24 F and #27 F )  LC recommended buying the Ameda comfort gels and use them after pumping with bra.  Engorgement prevention and tx reviewed  Reviewed and stressed to mom the breast need to be released down to soften to know the engorgement is under control.  LC recommended calling on next Monday for LC O/P appt to have sore nipples assessed , weight check on baby and also for reassurance.  Mom receptive to [facility] recommendations.  LC encouraged mom to call back of needed and also provided the [facility] O/P #.

## 2019-11-15 ENCOUNTER — Telehealth: Payer: Self-pay

## 2019-11-15 NOTE — Telephone Encounter (Signed)
Pt called requesting Lactation services.  Message routed to [place], [facility]

## 2019-11-16 NOTE — Telephone Encounter (Signed)
Called pt in regards to questions about Lactation. Pt reports she and infant has Thrush.   Pt reports infant has thrush and that she has thrush in her breasts. She reports she was given an Oral Medication and she reports she did not feel good on the medicine so she stopped taking it after 1 day.   She is having pain to the right nipple and is noting pain with pumping. She is having stabbing pain in  both breasts at times but not consistently it is after pumping. She reports it is 2 times a day. She was given Diflucan for 10 days. Enc her to take the Diflucan to treat the intraductal yeast.   She is having pain to her right nipple mainly. She was using Monistat on her nipples, she is now using Coconut oil and did the Monistat yesterday some.   She has not been latching infant on for the last few weeks. He is being bottle fed breast milk. Infant did latch a few days ago. Mom is pumping about every 3 hours. She has enough milk to feed infant. She is concerned with latching infant due to thrush. Enc her to latch if she would like as both are being treated.    Infant is on Nystatin and Thrush is clearing. She reports infant had a diaper rash that is cleared. She used Desitin and Vaseline.   She has been boiling bottles, pump parts, pacifiers etc.   Reviewed Patient Instructions for Care of Thrush in the Mother and Baby handout.

## 2019-12-10 IMAGING — US US MFM OB FOLLOW-UP EACH ADDL GEST (MODIFY)
1 series · 12 of 28 positions shown · non-contrast
Comparison: none

[Series 1: us mfm ob follow-up each addl gest (modify) · 43 acquisitions, 12 frames shown]
[im 2/43]
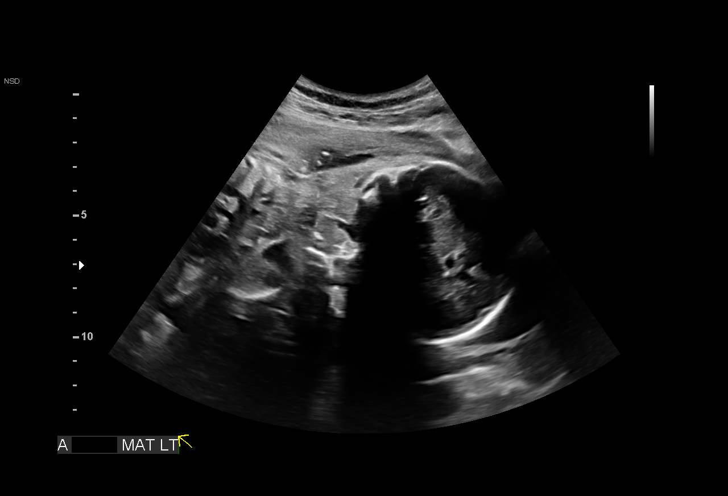
[im 5/43]
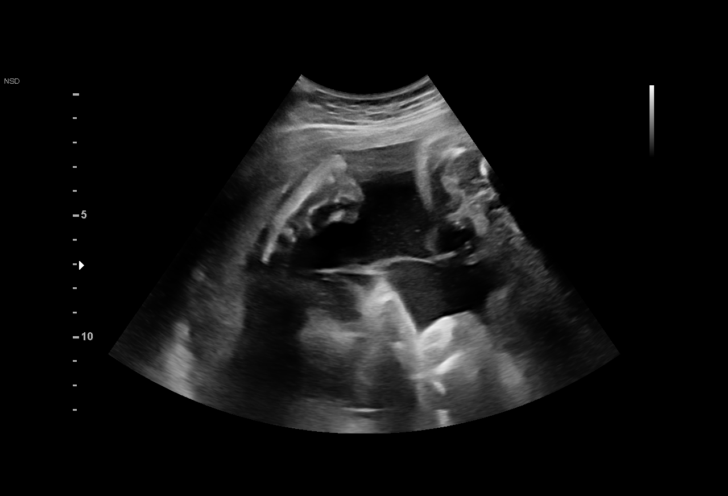
[im 8/43]
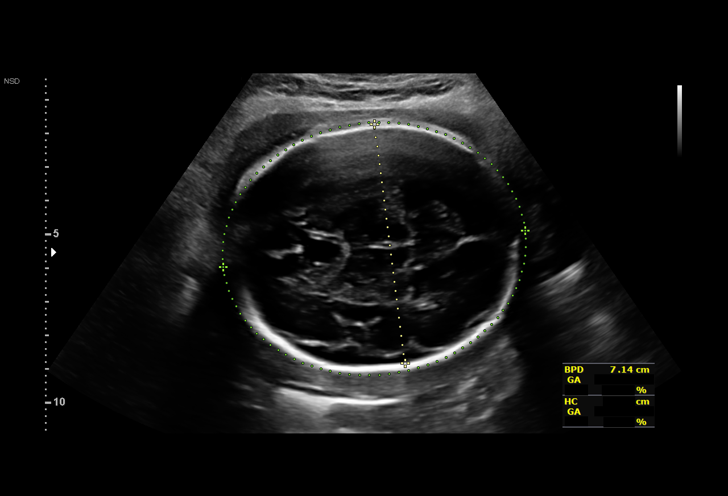
[im 13/43]
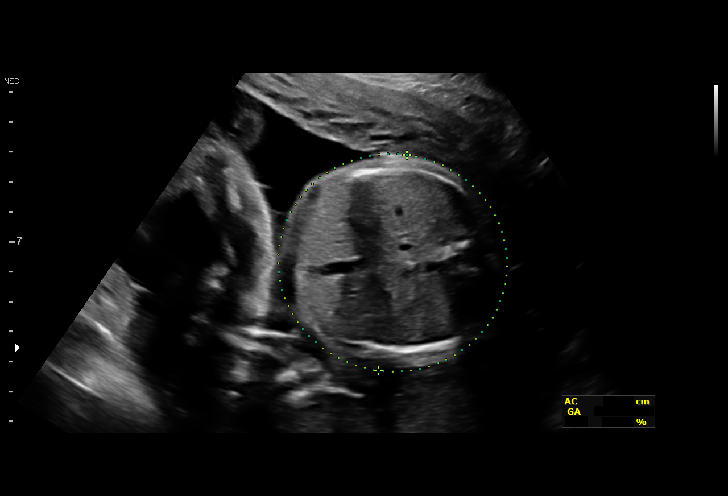
[im 16/43]
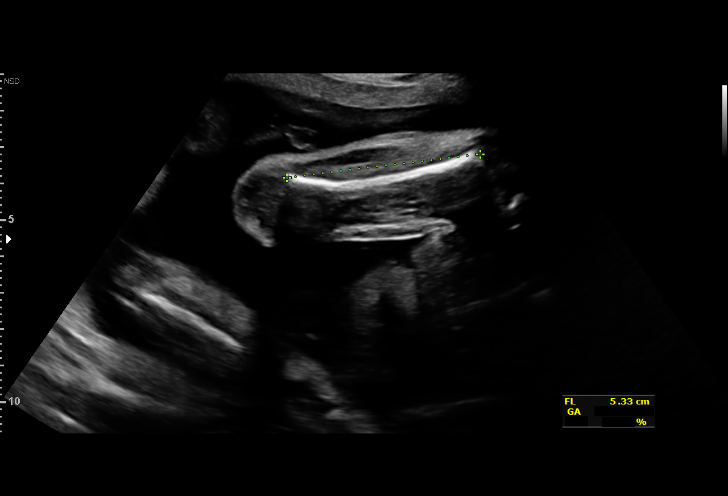
[im 19/43]
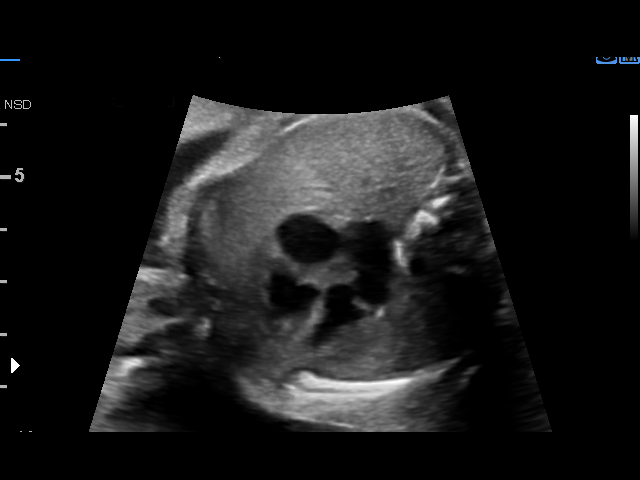
[im 24/43]
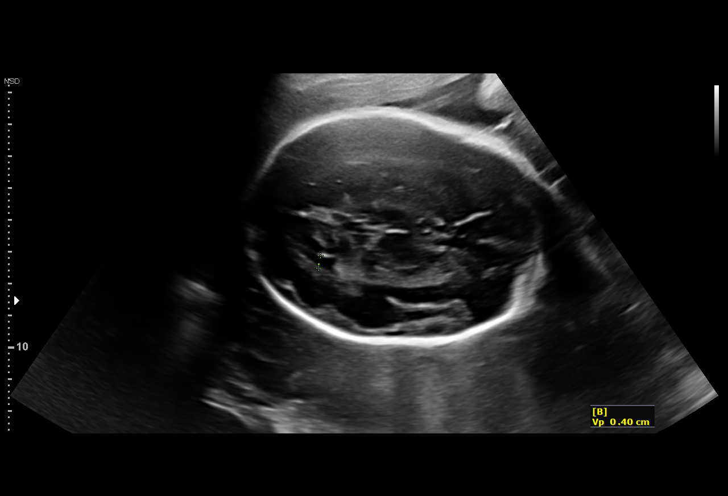
[im 27/43]
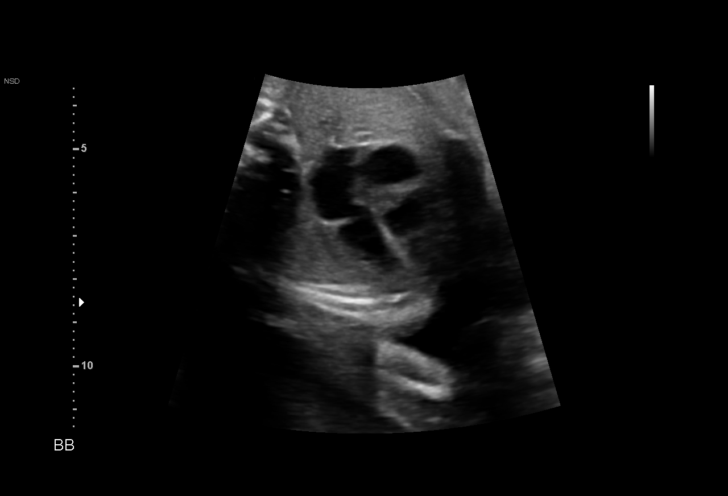
[im 30/43]
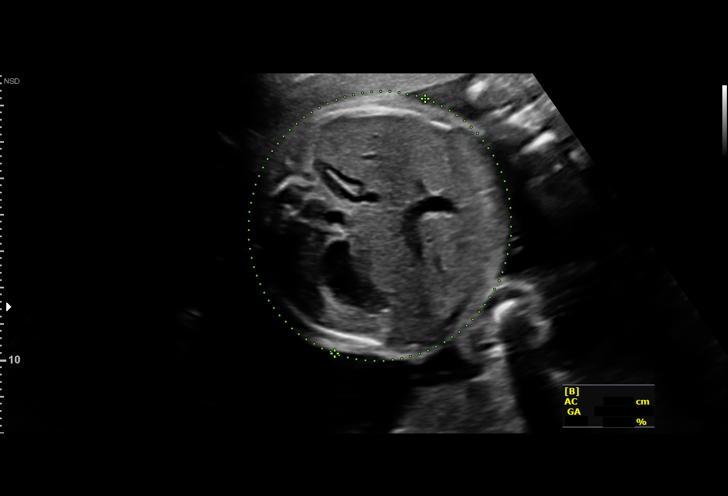
[im 35/43]
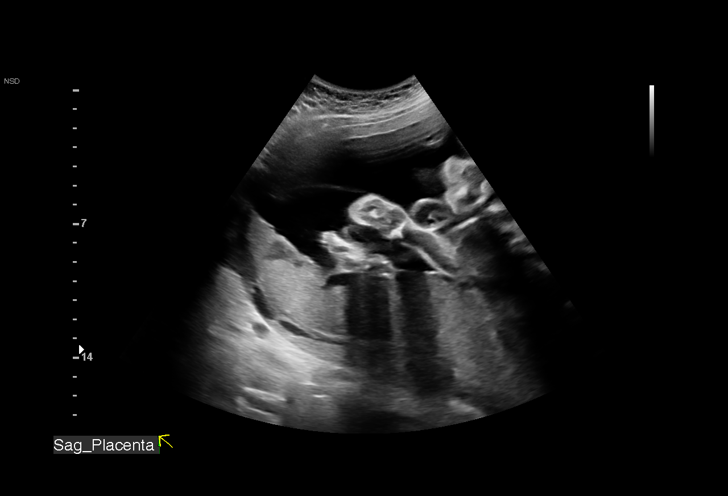
[im 38/43]
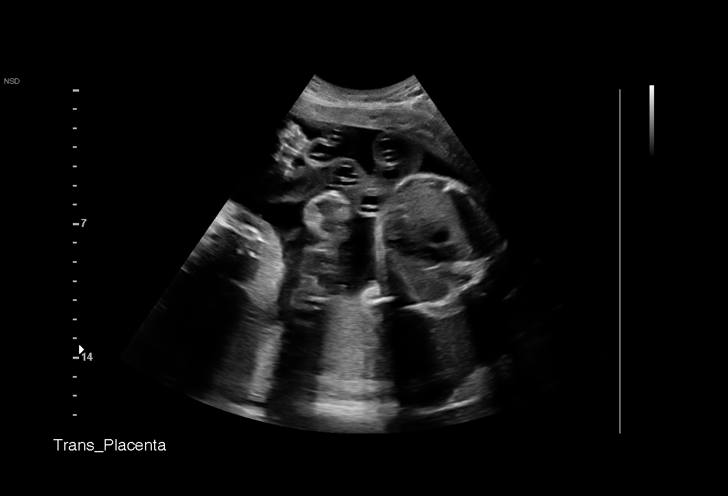
[im 41/43]
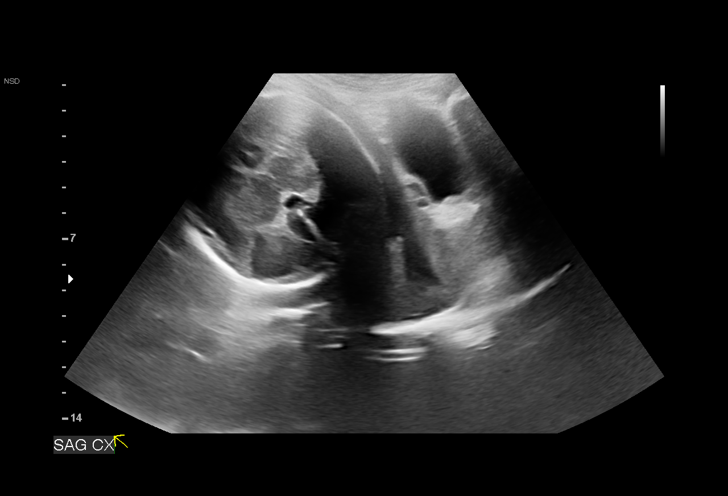

[12 of 28 positions shown; findings below may reference images not displayed]

OB/Gyn Clinic

1  KHATIRI MANS              [id number]      [id number]     [id number]
2  KHATIRI MANS              [id number]      [id number]     [id number]
Indications

28 weeks gestation of pregnancy
Twin pregnancy, Erxleben/Zachoval, second trimester
Poor obstetric history: Previous fetal growth
restriction (FGR)
Hypertension - Chronic/Pre-existing
OB History

Gravidity:    3         Term:   1        Prem:   0        SAB:   1
TOP:          0       Ectopic:  0        Living: 1
Fetal Evaluation (Fetus A)

Num Of Fetuses:     2
Fetal Heart         145
Rate(bpm):
Cardiac Activity:   Observed
Fetal Lie:          Maternal left side
Presentation:       Cephalic
Placenta:           Posterior, above cervical os
P. Cord Insertion:  Previously Visualized
Membrane Desc:      Dividing Membrane seen - Monochorionic

Amniotic Fluid
AFI FV:      Subjectively within normal limits
Largest Pocket(cm)
4.7
Biometry (Fetus A)

BPD:      71.9  mm     G. Age:  28w 6d         57  %    CI:        75.75   %    70 - 86
FL/HC:      20.1   %    18.8 -
HC:      261.9  mm     G. Age:  28w 3d         25  %    HC/AC:      1.12        1.05 -
AC:      233.9  mm     G. Age:  27w 5d         26  %    FL/BPD:     73.3   %    71 - 87
FL:       52.7  mm     G. Age:  28w 0d         28  %    FL/AC:      22.5   %    20 - 24

Est. FW:    5555  gm      2 lb 9 oz     44  %     FW Discordancy      0 \ 1 %
Gestational Age (Fetus A)

LMP:           29w 2d        Date:  06/28/17                 EDD:   04/04/18
U/S Today:     28w 2d                                        EDD:   04/11/18
Best:          28w 2d     Det. By:  Early Ultrasound         EDD:   04/11/18
(09/04/17)
Anatomy (Fetus A)

Cranium:               Appears normal         Aortic Arch:            Previously seen
Cavum:                 Previously seen        Ductal Arch:            Previously seen
Ventricles:            Appears normal         Diaphragm:              Previously seen
Choroid Plexus:        Previously seen        Stomach:                Appears normal, left
sided
Cerebellum:            Previously seen        Abdomen:                Appears normal
Posterior Fossa:       Previously seen        Abdominal Wall:         Previously seen
Nuchal Fold:           Previously seen        Cord Vessels:           Previously seen
Face:                  Orbits and profile     Kidneys:                Appear normal
previously seen
Lips:                  Previously seen        Bladder:                Appears normal
Thoracic:              Appears normal         Spine:                  Previously seen
Heart:                 Appears normal         Upper Extremities:      Previously seen
(4CH, axis, and situs
RVOT:                  Previously seen        Lower Extremities:      Previously seen
LVOT:                  Previously seen

Other:  Male gender previously seen.

Fetal Evaluation (Fetus B)

Num Of Fetuses:     2
Fetal Heart         135
Rate(bpm):
Cardiac Activity:   Observed
Fetal Lie:          Maternal right side
Presentation:       Breech
Placenta:           Posterior, above cervical os
P. Cord Insertion:  Previously Visualized
Membrane Desc:      Dividing Membrane seen - Monochorionic

Amniotic Fluid
AFI FV:      Subjectively within normal limits

Largest Pocket(cm)
7
Biometry (Fetus B)
BPD:      71.2  mm     G. Age:  28w 4d         48  %    CI:        70.16   %    70 - 86
FL/HC:      19.3   %    18.8 -
HC:      271.1  mm     G. Age:  29w 4d         59  %    HC/AC:      1.17        1.05 -
AC:      232.3  mm     G. Age:  27w 4d         23  %    FL/BPD:     73.5   %    71 - 87
FL:       52.3  mm     G. Age:  27w 6d         24  %    FL/AC:      22.5   %    20 - 24

Est. FW:    5516  gm      2 lb 8 oz     43  %     FW Discordancy         1  %
Gestational Age (Fetus B)

LMP:           29w 2d        Date:  06/28/17                 EDD:   04/04/18
U/S Today:     28w 3d                                        EDD:   04/10/18
Best:          28w 2d     Det. By:  Early Ultrasound         EDD:   04/11/18
(09/04/17)
Anatomy (Fetus B)

Cranium:               Appears normal         Aortic Arch:            Previously seen
Cavum:                 Previously seen        Ductal Arch:            Previously seen
Ventricles:            Appears normal         Diaphragm:              Appears normal
Choroid Plexus:        Previously seen        Stomach:                Appears normal, left
sided
Cerebellum:            Previously seen        Abdomen:                Appears normal
Posterior Fossa:       Previously seen        Abdominal Wall:         Previously seen
Nuchal Fold:           Previously seen        Cord Vessels:           Previously seen
Face:                  Orbits and profile     Kidneys:                Previously seen
previously seen
Lips:                  Previously seen        Bladder:                Appears normal
Thoracic:              Appears normal         Spine:                  Previously seen
Heart:                 Appears normal         Upper Extremities:      Previously seen
(4CH, axis, and situs
RVOT:                  Not well visualized    Lower Extremities:      Previously seen
LVOT:                  Previously seen

Other:  Male gender previously seen.
Cervix Uterus Adnexa

Cervix
Length:            2.9  cm.
Normal appearance by transabdominal scan.

Left Ovary
Previously seen.

Right Ovary
Previously seen
Impression

Monochorionic/diamniotic twin pregnancy at 28+2 weeks with
CHTN
Twin A:
Presentation is cephalic
Interval review of fetal anatomy was normal
Normal amniotic fluid volume
Appropriate interval growth with EFW at the 44th percentile
Twin B:
Presentation is breech
Interval review of fetal anatomy was normal
Normal amniotic fluid volume
Appropriate interval growth with EFW at the 43rd percentile
Dicordance is 1%
There is no evidence of TTTS
Recommendations

Continued scheduled TTTS and growth surveillance

## 2019-12-24 IMAGING — US US MFM OB LIMITED
1 series · 14 of 14 positions shown · non-contrast
Comparison: none

[Series 1: us mfm ob limited · 14 of 14 slices shown]
[im 1/14]
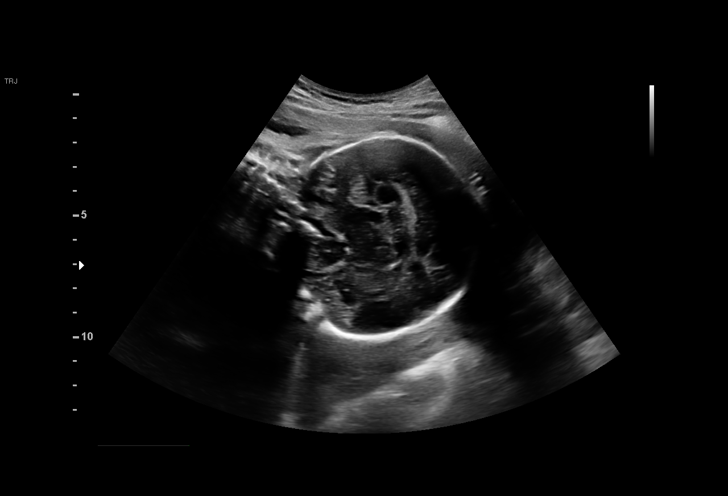
[im 2/14]
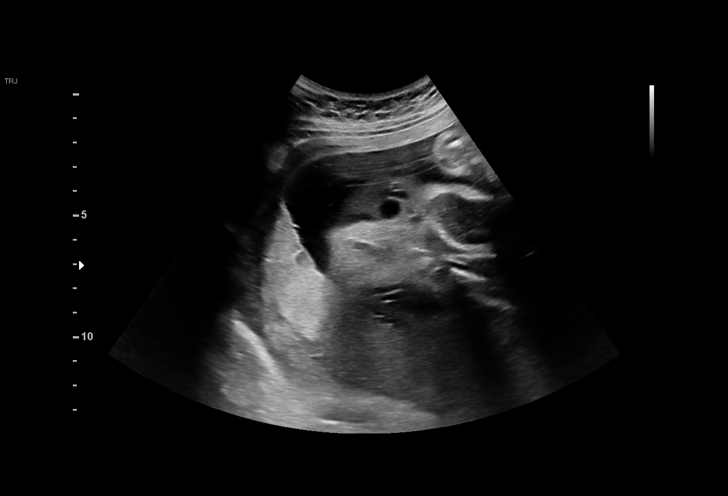
[im 3/14]
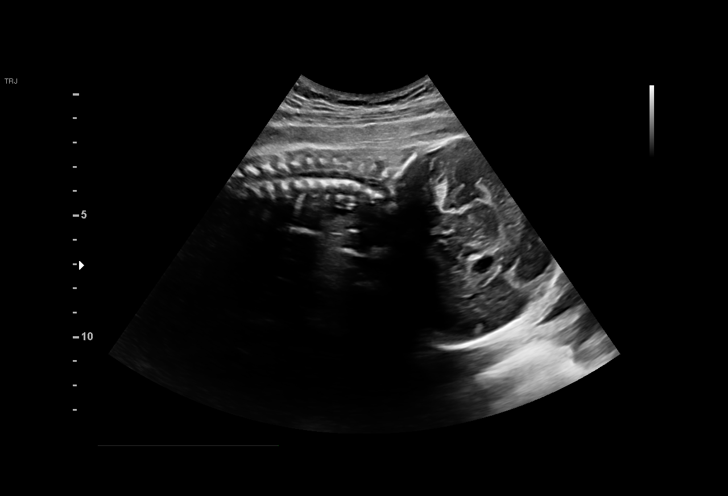
[im 4/14]
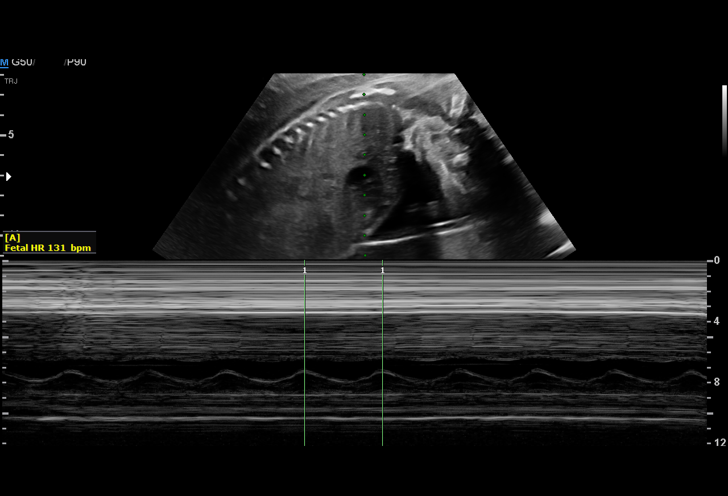
[im 5/14]
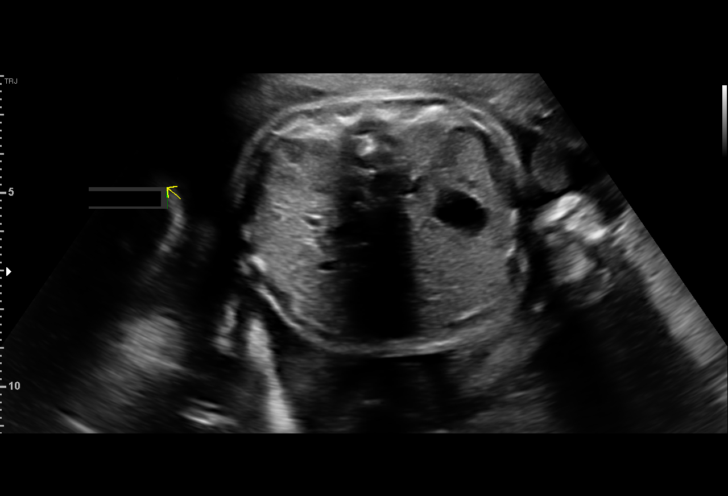
[im 6/14]
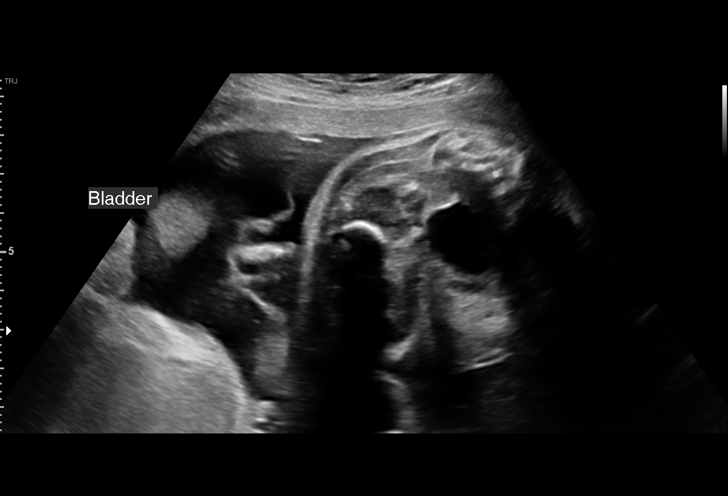
[im 7/14]
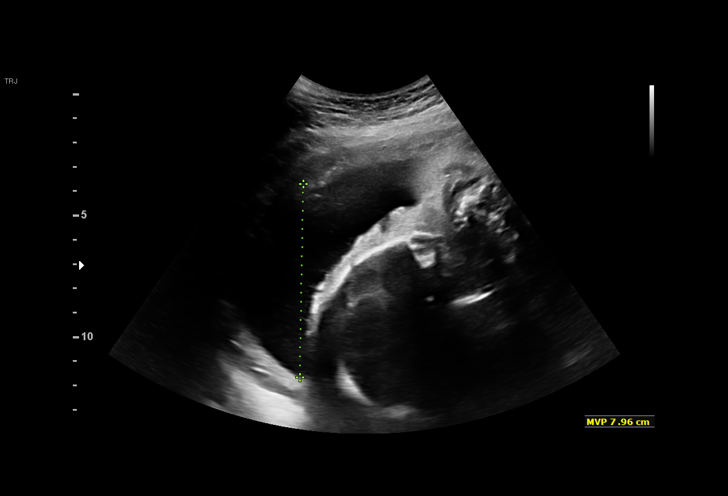
[im 8/14]
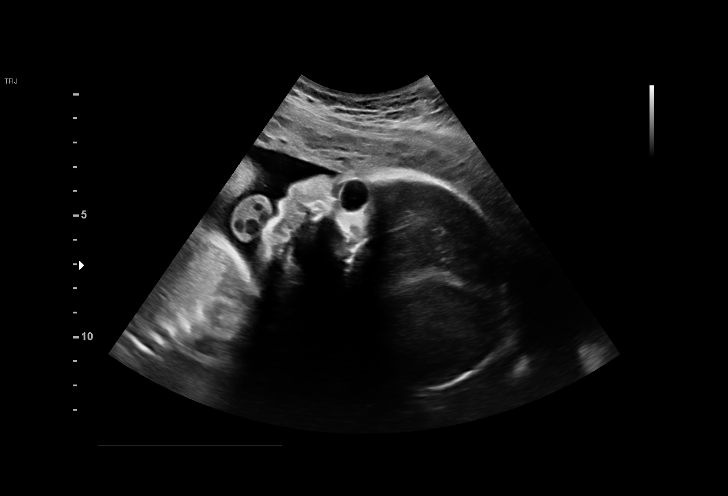
[im 9/14]
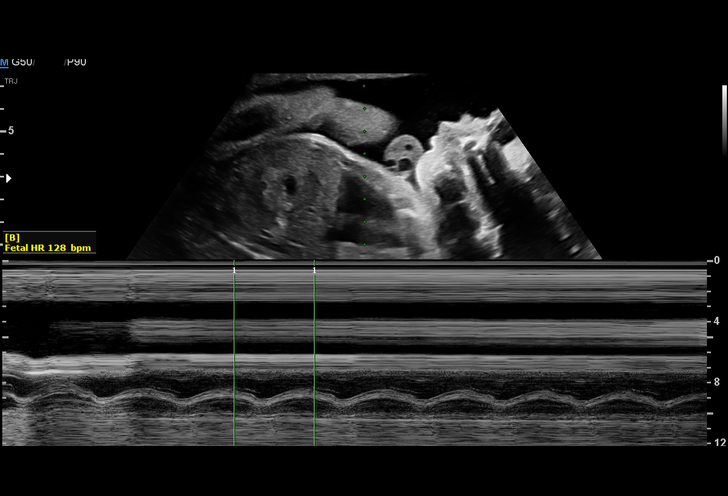
[im 10/14]
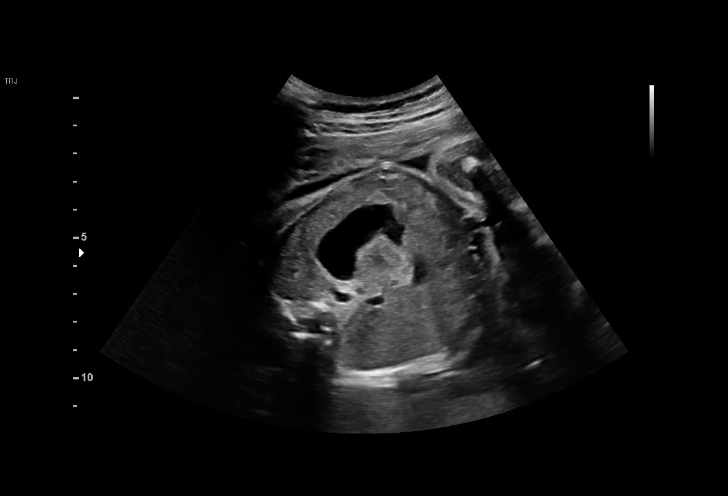
[im 11/14]
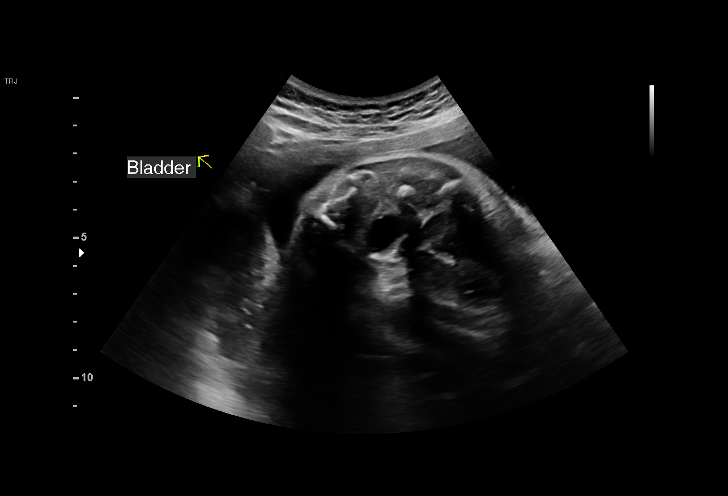
[im 12/14]
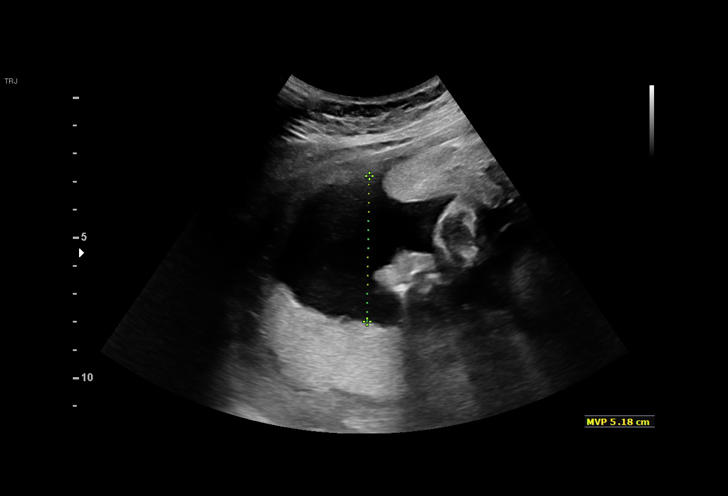
[im 13/14]
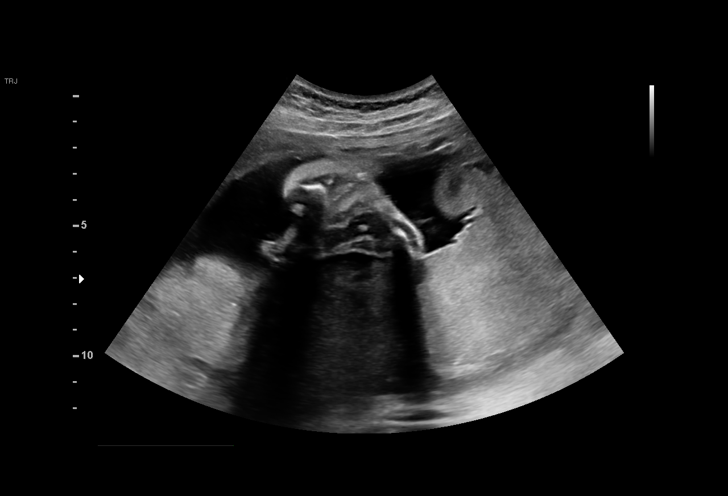
[im 14/14]
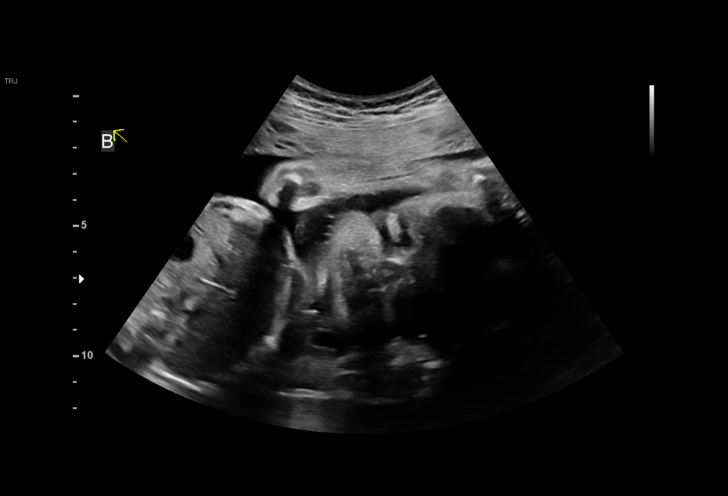

[14 of 14 positions shown; findings below may reference images not displayed]

1  RTOYOTA [id number]              [id number]      [id number]     [id number]
Indications

30 weeks gestation of pregnancy
Twin pregnancy, Nero/Deltoro, third trimester
Hypertension - Chronic/Pre-existing
Poor obstetric history: Previous fetal growth
restriction (FGR)
OB History

Gravidity:    3         Term:   1        Prem:   0        SAB:   1
TOP:          0       Ectopic:  0        Living: 1
Fetal Evaluation (Fetus A)

Num Of Fetuses:     2
Fetal Heart         131
Rate(bpm):
Cardiac Activity:   Observed
Presentation:       Cephalic, Maternal Left
Placenta:           Posterior, above cervical os

Largest Pocket(cm)
7.96
Gestational Age (Fetus A)

LMP:           31w 2d        Date:  06/28/17                 EDD:   04/04/18
Best:          30w 2d     Det. By:  Early Ultrasound         EDD:   04/11/18
(09/04/17)
Anatomy (Fetus A)

Stomach:               Appears normal, left   Bladder:                Appears normal
sided
Fetal Evaluation (Fetus B)
Num Of Fetuses:     2
Fetal Heart         128
Rate(bpm):
Cardiac Activity:   Observed
Presentation:       Cephalic, Maternal Right
Placenta:           Posterior, above cervical os

Largest Pocket(cm)
5.18
Gestational Age (Fetus B)

LMP:           31w 2d        Date:  06/28/17                 EDD:   04/04/18
Best:          30w 2d     Det. By:  Early Ultrasound         EDD:   04/11/18
(09/04/17)
Anatomy (Fetus B)

Stomach:               Appears normal, left   Bladder:                Appears normal
sided
Impression

Monochorionic/diamniotic twin pregnancy at 30+2 weeks
Twin A:
Presentation is cephalic
Interval review of fetal anatomy was normal
Normal amniotic fluid volume

Twin B:
Presentation is cephalic
Interval review of fetal anatomy was normal
Normal amniotic fluid volume
There is no evidence of TTTS

Recommendations

Continued scheduled TTTS and growth surveillance

## 2020-03-28 ENCOUNTER — Encounter: Payer: Self-pay | Admitting: Family Medicine

## 2021-02-14 ENCOUNTER — Telehealth: Payer: Self-pay | Admitting: Adult Health

## 2021-02-14 NOTE — Telephone Encounter (Signed)
The patient said that she cut her hand and wanted to know if she should get a tetanus shot. the patient is unsure when she had her last one

## 2021-02-16 ENCOUNTER — Other Ambulatory Visit: Payer: Self-pay

## 2021-02-16 ENCOUNTER — Emergency Department (HOSPITAL_COMMUNITY)
Admission: EM | Admit: 2021-02-16 | Discharge: 2021-02-16 | Disposition: A | Discharge status: Home / Self Care | Payer: Medicaid Other | Attending: Emergency Medicine | Admitting: Emergency Medicine

## 2021-02-16 ENCOUNTER — Encounter (HOSPITAL_COMMUNITY): Payer: Self-pay

## 2021-02-16 DIAGNOSIS — R519 Headache, unspecified: Secondary | ICD-10-CM

## 2021-02-16 LAB — I-STAT BETA HCG BLOOD, ED (MC, WL, AP ONLY): I-stat hCG, quantitative: <5 m[IU]/mL (ref <5)

## 2021-02-16 MED ORDER — PROCHLORPERAZINE EDISYLATE 10 MG/2ML IJ SOLN
10.0000 mg | Freq: Once | INTRAMUSCULAR | Status: AC
  Administered 2021-02-16: 10 mg via INTRAVENOUS
  Filled 2021-02-16: qty 2

## 2021-02-16 MED ORDER — DIPHENHYDRAMINE HCL 50 MG/ML IJ SOLN
12.5000 mg | Freq: Once | INTRAMUSCULAR | Status: AC
  Administered 2021-02-16: 12.5 mg via INTRAVENOUS
  Filled 2021-02-16: qty 1

## 2021-02-16 NOTE — Discharge Instructions (Addendum)
You were seen in the emergency department today for headache.  You were given a migraine cocktail for this with improvement.  Please continue to take Tylenol and or ibuprofen per over-the-counter dosing to help with any return of pain.  Follow-up with primary care within 3 days.  Return to the ER for new or worsening symptoms including but not limited to increased pain, sudden change in pain, new pain, change in your vision, numbness, weakness, dizziness, change in your speech, neck stiffness, fever, passing out, or any other concerns.

## 2021-02-16 NOTE — ED Notes (Signed)
Patient verbalizes understanding of discharge instructions. Opportunity for questioning and answers were provided. Armband removed by staff, pt discharged from ED ambulatory.   

## 2021-02-16 NOTE — ED Provider Notes (Signed)
[facility] EMERGENCY DEPARTMENT Provider Note   CSN: [id number] Arrival date & time: 02/16/21  0256     History Chief Complaint  Patient presents with  . Migraine  . Hypertension    Courtney [name] is a 30 y.o. female with a history of anemia and migraines who presents to the emergency department with complaints of a headache that began this morning when she woke up.  Patient states the pain is located to the top of her head into the frontal region, gradual onset, waxing waning in severity, no aggravating factors or alleviating factors.  She tried ibuprofen and Tylenol without relief.  She is concerned because her blood pressure is elevated with her headache which is atypical for her, however she has had headaches like this previously.  Denies fever, chills, neck stiffness, visual disturbance, numbness, tingling, weakness, syncope, chest pain, or dyspnea.  HPI     Past Medical History:  Diagnosis Date  . Anemia   . Migraine   . Trichomonal vaginitis during pregnancy     Patient Active Problem List   Diagnosis Date Noted  . SVD (12/23) 10/12/2019  . Anxiety 10/11/2019  . Migraine hx 10/11/2019  . Encounter for induction of labor 10/11/2019  . IUGR 10/11/2019  . Encounter for planned induction of labor 10/11/2019  . History of prior pregnancy with SGA newborn 09/22/2017    Past Surgical History:  Procedure Laterality Date  . NO PAST SURGERIES    . WISDOM TOOTH EXTRACTION       OB History    Gravida  4   Para  3   Term  3   Preterm      AB  1   Living  4     SAB  1   IAB      Ectopic      Multiple  1   Live Births  4           Family History  Problem Relation Age of Onset  . Diabetes Mother   . Multiple sclerosis Mother   . Fibromyalgia Mother   . Kidney disease Father   . Hypertension Father   . Diabetes Maternal Uncle   . Diabetes Maternal Grandmother   . Cancer Paternal Grandfather        lung  . Autism Paternal  Uncle        half-uncle ( husbands brother)   . Hypertension Maternal Grandfather   . Heart attack Maternal Grandfather   . Stroke Maternal Grandfather   . Hypertension Paternal Grandmother   . Stroke Paternal Grandmother   . Heart disease Paternal Grandmother     Social History   Tobacco Use  . Smoking status: Never Smoker  . Smokeless tobacco: Never Used  Vaping Use  . Vaping Use: Never used  Substance Use Topics  . Alcohol use: No  . Drug use: No    Home Medications Prior to Admission medications   Medication Sig Start Date End Date Taking? Authorizing Provider  acetaminophen (TYLENOL) 325 MG tablet Take 2 tablets (650 mg total) by mouth every 4 (four) hours as needed (for pain scale < 4). 10/12/19   Roma Schanz, CNM  ibuprofen (ADVIL) 600 MG tablet Take 1 tablet (600 mg total) by mouth every 6 (six) hours. 10/12/19   Roma Schanz, CNM  norethindrone (NORLYDA) 0.35 MG tablet Take 1 tablet by mouth daily.    Jaymes Graff, MD  Prenatal Vit-Fe Fumarate-FA (PRENATAL MULTIVITAMIN) TABS tablet  Take 1 tablet by mouth daily at 12 noon.    [provider]    Allergies    Patient has no known allergies.  Review of Systems   Review of Systems  Constitutional: Negative for chills and fever.  Eyes: Negative for visual disturbance.  Respiratory: Negative for shortness of breath.   Cardiovascular: Negative for chest pain.  Gastrointestinal: Negative for abdominal pain, nausea and vomiting.  Neurological: Positive for headaches. Negative for dizziness, seizures, syncope, speech difficulty and weakness.  All other systems reviewed and are negative.   Physical Exam Updated Vital Signs BP (!) 145/112 (BP Location: Left Arm)   Pulse 96   Temp 98.1 F (36.7 C) (Oral)   Resp 18   Ht 5\' 2"  (1.575 m)   Wt 54.4 kg   SpO2 100%   BMI 21.95 kg/m   Physical Exam Vitals and nursing note reviewed.  Constitutional:      General: She is not in acute distress.     Appearance: She is well-developed. She is not toxic-appearing.  HENT:     Head: Normocephalic and atraumatic.  Eyes:     General: Vision grossly intact. Gaze aligned appropriately.        Right eye: No discharge.        Left eye: No discharge.     Extraocular Movements: Extraocular movements intact.     Conjunctiva/sclera: Conjunctivae normal.     Comments: PERRL. No proptosis.   Cardiovascular:     Rate and Rhythm: Normal rate and regular rhythm.  Pulmonary:     Effort: Pulmonary effort is normal. No respiratory distress.     Breath sounds: Normal breath sounds. No wheezing, rhonchi or rales.  Abdominal:     General: There is no distension.     Palpations: Abdomen is soft.     Tenderness: There is no abdominal tenderness.  Musculoskeletal:     Cervical back: Normal range of motion and neck supple. No rigidity.  Skin:    General: Skin is warm and dry.     Findings: No rash.  Neurological:     Comments: Alert. Clear speech. No facial droop. CNIII-XII grossly intact. Bilateral upper and lower extremities' sensation grossly intact. 5/5 symmetric strength with grip strength and with plantar and dorsi flexion bilaterally . Normal finger to nose bilaterally. Negative pronator drift. Negative Romberg sign. Gait is steady and intact.   Psychiatric:        Behavior: Behavior normal.     ED Results / Procedures / Treatments   Labs (all labs ordered are listed, but only abnormal results are displayed) Labs Reviewed  I-STAT BETA HCG BLOOD, ED ([facility], [facility], [facility] ONLY)    EKG None  Radiology No results found.  Procedures Procedures   Medications Ordered in ED Medications  prochlorperazine (COMPAZINE) injection 10 mg (has no administration in time range)  diphenhydrAMINE (BENADRYL) injection 12.5 mg (has no administration in time range)    ED Course  I have reviewed the triage vital signs and the nursing notes.  Pertinent labs & imaging results that were available during my care of  the patient were reviewed by me and considered in my medical decision making (see chart for details).    MDM Rules/Calculators/A&P                         Patient presents with complaint of headache. Chart & nursing note reviewed for additional hx. Patient is nontoxic appearing, resting comfortably,  vitals w/ mildly elevated BP- improved in the ED with repeat vitals. Patient has hx of similar headaches, gradual onset with steady progression in severity, afebrile, no nuchal rigidity, no neuro deficits, no dizziness, and no visual disturbance overall low suspicion for [facility], ICH, ischemic CVA, dural venous sinus thrombosis, acute glaucoma, giant cell arteritis, mass, or meningitis. . Patient treated for headache with migraine cocktail with resolution of pain, feels ready to go home.. I discussed treatment plan, need for PCP follow-up, and return precautions with the patient. Provided opportunity for questions, patient confirmed understanding and is in agreement with plan.    Final Clinical Impression(s) / ED Diagnoses Final diagnoses:  Acute nonintractable headache, unspecified headache type    Rx / DC Orders ED Discharge Orders    None       Cherly Anderson, PA-C 02/16/21 Ulis Rias    Geoffery Lyons, MD 02/16/21 [phone]

## 2021-02-16 NOTE — ED Triage Notes (Signed)
Patient reports migraine starting this morning, states she took tylenol and motrin without relief, also states she took her BP and it was 140/102, reports some hx of HTN.

## 2021-02-18 NOTE — Telephone Encounter (Signed)
Left a message for the pt to return my call.  

## 2021-02-18 NOTE — Telephone Encounter (Signed)
Last tetanus was in 2020 - she is up to date

## 2021-02-20 NOTE — Telephone Encounter (Signed)
Left a message for the pt to return my call.  

## 2021-02-25 NOTE — Telephone Encounter (Signed)
Attempted to contact patient x 3 times with no return calls, closing encounter.

## 2021-02-25 NOTE — Telephone Encounter (Signed)
Left pt a voicemail to return our call.

## 2021-04-16 ENCOUNTER — Encounter (HOSPITAL_COMMUNITY): Payer: Self-pay | Admitting: Emergency Medicine

## 2021-04-16 ENCOUNTER — Other Ambulatory Visit: Payer: Self-pay

## 2021-04-16 ENCOUNTER — Emergency Department (HOSPITAL_COMMUNITY)
Admission: EM | Admit: 2021-04-16 | Discharge: 2021-04-16 | Disposition: A | Discharge status: Home / Self Care | Payer: Medicaid Other | Attending: Emergency Medicine | Admitting: Emergency Medicine

## 2021-04-16 ENCOUNTER — Emergency Department (HOSPITAL_COMMUNITY): Payer: Medicaid Other

## 2021-04-16 DIAGNOSIS — M25519 Pain in unspecified shoulder: Secondary | ICD-10-CM | POA: Insufficient documentation

## 2021-04-16 DIAGNOSIS — M549 Dorsalgia, unspecified: Secondary | ICD-10-CM | POA: Diagnosis not present

## 2021-04-16 DIAGNOSIS — R002 Palpitations: Secondary | ICD-10-CM | POA: Diagnosis not present

## 2021-04-16 DIAGNOSIS — R0789 Other chest pain: Secondary | ICD-10-CM

## 2021-04-16 DIAGNOSIS — R000 Tachycardia, unspecified: Secondary | ICD-10-CM | POA: Diagnosis not present

## 2021-04-16 DIAGNOSIS — R030 Elevated blood-pressure reading, without diagnosis of hypertension: Secondary | ICD-10-CM

## 2021-04-16 DIAGNOSIS — F419 Anxiety disorder, unspecified: Secondary | ICD-10-CM | POA: Insufficient documentation

## 2021-04-16 LAB — BASIC METABOLIC PANEL
Anion gap: 8 (ref 5–15)
BUN: 11 mg/dL (ref 6–20)
CO2: 23 mmol/L (ref 22–32)
Calcium: 9.5 mg/dL (ref 8.9–10.3)
Chloride: 105 mmol/L (ref 98–111)
Creatinine, Ser: 0.75 mg/dL (ref 0.44–1.00)
GFR, Estimated: >60 mL/min (ref >60)
Glucose, Bld: 111 mg/dL — ABNORMAL HIGH (ref 70–99)
Potassium: 3.3 mmol/L — ABNORMAL LOW (ref 3.5–5.1)
Sodium: 136 mmol/L (ref 135–145)

## 2021-04-16 LAB — CBC WITH DIFFERENTIAL/PLATELET
Abs Immature Granulocytes: 0.02 10*3/uL (ref 0.00–0.07)
Basophils Absolute: 0 10*3/uL (ref 0.0–0.1)
Basophils Relative: 1 %
Eosinophils Absolute: 0.1 10*3/uL (ref 0.0–0.5)
Eosinophils Relative: 1 %
HCT: 34 % — ABNORMAL LOW (ref 36.0–46.0)
Hemoglobin: 10.8 g/dL — ABNORMAL LOW (ref 12.0–15.0)
Immature Granulocytes: 0 %
Lymphocytes Relative: 36 %
Lymphs Abs: 2.5 10*3/uL (ref 0.7–4.0)
MCH: 25.5 pg — ABNORMAL LOW (ref 26.0–34.0)
MCHC: 31.8 g/dL (ref 30.0–36.0)
MCV: 80.4 fL (ref 80.0–100.0)
Monocytes Absolute: 0.6 10*3/uL (ref 0.1–1.0)
Monocytes Relative: 8 %
Neutro Abs: 3.8 10*3/uL (ref 1.7–7.7)
Neutrophils Relative %: 54 %
Platelets: 281 10*3/uL (ref 150–400)
RBC: 4.23 MIL/uL (ref 3.87–5.11)
RDW: 15.2 % (ref 11.5–15.5)
WBC: 7 10*3/uL (ref 4.0–10.5)
nRBC: 0 % (ref 0.0–0.2)

## 2021-04-16 LAB — TROPONIN I (HIGH SENSITIVITY)
Troponin I (High Sensitivity): <2 ng/L (ref <18)
Troponin I (High Sensitivity): <2 ng/L (ref <18)

## 2021-04-16 MED ORDER — METHOCARBAMOL 500 MG PO TABS: 500.0000 mg | ORAL_TABLET | Freq: Two times a day (BID) | ORAL | 0 refills | Status: DC

## 2021-04-16 NOTE — ED Triage Notes (Signed)
Pt reports having chest "twinges.  She reports she has been stressed and that could be causing "this."  Pain to the left hand, stress makes pain worse.

## 2021-04-16 NOTE — ED Provider Notes (Signed)
[facility] EMERGENCY DEPARTMENT Provider Note   CSN: [id number] Arrival date & time: 04/16/21  0106     History Chief Complaint  Patient presents with   Chest Pain    Courtney [name] is a 30 y.o. female with a history of migraine, anxiety who presents the emergency department with a chief complaint of chest pain.  The patient's reports she has been having intermittent left-sided chest pain that has been coming and going for the last few days.  She characterizes the pain as feeling a "twinge" in her chest.  She first noticed the sensation while she was doing a Pilates move where she was propped up on her bilateral forearms.  She reports that that she will sometimes noticed soreness in her left upper back and shoulder.  Over the last day, she has developed palpitations.  She always feels as if her heart is racing, but she has noticed this more over the last few days.  Symptoms are not worse with exertion.  No other known aggravating or alleviating factors.  No shortness of breath, cough, fever, chills, dizziness, lightheadedness, nausea, vomiting, diarrhea, leg swelling, numbness, or weakness.  No history of similar.  No family history of cardiac death.  She reports if family history of CAD in her maternal grandmother who had an MI in her 47s.  No recent long travel.  No recent surgery or immobilization.  No personal or familial history of VTE.  She does not take OCPs.  No concern for pregnancy.  No familial history of thyroid disorders.  The history is provided by the patient and medical records. No language interpreter was used.      Past Medical History:  Diagnosis Date   Anemia    Migraine    Trichomonal vaginitis during pregnancy     Patient Active Problem List   Diagnosis Date Noted   SVD (12/23) 10/12/2019   Anxiety 10/11/2019   Migraine hx 10/11/2019   Encounter for induction of labor 10/11/2019   IUGR 10/11/2019   Encounter for planned induction of  labor 10/11/2019   History of prior pregnancy with SGA newborn 09/22/2017    Past Surgical History:  Procedure Laterality Date   NO PAST SURGERIES     WISDOM TOOTH EXTRACTION       OB History     Gravida  4   Para  3   Term  3   Preterm      AB  1   Living  4      SAB  1   IAB      Ectopic      Multiple  1   Live Births  4           Family History  Problem Relation Age of Onset   Diabetes Mother    Multiple sclerosis Mother    Fibromyalgia Mother    Kidney disease Father    Hypertension Father    Diabetes Maternal Uncle    Diabetes Maternal Grandmother    Cancer Paternal Grandfather        lung   Autism Paternal Uncle        half-uncle ( husbands brother)    Hypertension Maternal Grandfather    Heart attack Maternal Grandfather    Stroke Maternal Grandfather    Hypertension Paternal Grandmother    Stroke Paternal Grandmother    Heart disease Paternal Grandmother     Social History   Tobacco Use   Smoking status: Never  Smokeless tobacco: Never  Vaping Use   Vaping Use: Never used  Substance Use Topics   Alcohol use: No   Drug use: No    Home Medications Prior to Admission medications   Medication Sig Start Date End Date Taking? Authorizing Provider  methocarbamol (ROBAXIN) 500 MG tablet Take 1 tablet (500 mg total) by mouth 2 (two) times daily. 04/16/21  Yes Bogdan Vivona A, PA-C  acetaminophen (TYLENOL) 325 MG tablet Take 2 tablets (650 mg total) by mouth every 4 (four) hours as needed (for pain scale < 4). Patient taking differently: Take 650 mg by mouth every 6 (six) hours as needed for mild pain or headache. 10/12/19   Roma Schanz, CNM  ibuprofen (ADVIL) 200 MG tablet Take 400 mg by mouth every 6 (six) hours as needed for headache or moderate pain.    [provider]    Allergies    Patient has no known allergies.  Review of Systems   Review of Systems  Constitutional:  Negative for activity change, chills,  diaphoresis and fever.  HENT:  Negative for congestion and sore throat.   Respiratory:  Negative for cough, shortness of breath and wheezing.   Cardiovascular:  Positive for chest pain and palpitations. Negative for leg swelling.  Gastrointestinal:  Negative for abdominal pain, diarrhea, nausea and vomiting.  Genitourinary:  Negative for dysuria.  Musculoskeletal:  Negative for back pain, joint swelling, myalgias and neck stiffness.  Skin:  Negative for rash and wound.  Allergic/Immunologic: Negative for immunocompromised state.  Neurological:  Negative for dizziness, seizures, syncope, weakness, numbness and headaches.  Psychiatric/Behavioral:  Negative for confusion.    Physical Exam Updated Vital Signs BP (!) 128/98 (BP Location: Right Arm)   Pulse 94   Temp 98.5 F (36.9 C) (Oral)   Resp 17   SpO2 99%   Physical Exam Vitals and nursing note reviewed.  Constitutional:      General: She is not in acute distress.    Appearance: She is not ill-appearing, toxic-appearing or diaphoretic.     Comments: Thin female in no acute distress.  HENT:     Head: Normocephalic.     Mouth/Throat:     Comments: Thyroid is nontender. Eyes:     Conjunctiva/sclera: Conjunctivae normal.  Cardiovascular:     Rate and Rhythm: Regular rhythm. Tachycardia present.     Heart sounds: No murmur heard.   No friction rub. No gallop.     Comments: 3+ peripheral bounding pulses. Pulmonary:     Effort: Pulmonary effort is normal. No respiratory distress.     Breath sounds: No stridor. No wheezing, rhonchi or rales.     Comments: Lungs are clear to auscultation bilaterally.  No increased work of breathing.  No reproducible tenderness palpation to the chest wall. Chest:     Chest wall: No tenderness.  Abdominal:     General: There is no distension.     Palpations: Abdomen is soft. There is no mass.     Tenderness: There is no abdominal tenderness. There is no right CVA tenderness, left CVA tenderness,  guarding or rebound.     Hernia: No hernia is present.  Musculoskeletal:     Cervical back: Neck supple.     Right lower leg: No edema.     Left lower leg: No edema.     Comments: Full active and passive range of motion of the bilateral upper extremities.  No focal tenderness palpation.  No palpable muscle spasms.  Neurovascular  intact.  Skin:    General: Skin is warm.     Findings: No rash.  Neurological:     Mental Status: She is alert.  Psychiatric:        Mood and Affect: Mood is anxious.        Behavior: Behavior normal.    ED Results / Procedures / Treatments   Labs (all labs ordered are listed, but only abnormal results are displayed) Labs Reviewed  CBC WITH DIFFERENTIAL/PLATELET - Abnormal; Notable for the following components:      Result Value   Hemoglobin 10.8 (*)    HCT 34.0 (*)    MCH 25.5 (*)    All other components within normal limits  BASIC METABOLIC PANEL - Abnormal; Notable for the following components:   Potassium 3.3 (*)    Glucose, Bld 111 (*)    All other components within normal limits  TROPONIN I (HIGH SENSITIVITY)  TROPONIN I (HIGH SENSITIVITY)    EKG EKG Interpretation  Date/Time:  Wednesday April 16 2021 01:29:27 EDT Ventricular Rate:  111 PR Interval:  156 QRS Duration: 70 QT Interval:  294 QTC Calculation: 399 R Axis:   69 Text Interpretation: Sinus tachycardia Low voltage QRS Nonspecific ST and T wave abnormality Abnormal ECG No old tracing to compare Confirmed by Dione BoozeGlick, David ([id number]) on 04/16/2021 1:49:44 AM  Radiology DG Chest 2 View  Result Date: 04/16/2021 CLINICAL DATA:  Chest pain EXAM: CHEST - 2 VIEW COMPARISON:  None. FINDINGS: The heart size and mediastinal contours are within normal limits. Both lungs are clear. The visualized skeletal structures are unremarkable. IMPRESSION: No active cardiopulmonary disease. Electronically Signed   By: Helyn NumbersAshesh  Parikh MD   On: 04/16/2021 02:19    Procedures Procedures   Medications Ordered  in ED Medications - No data to display  ED Course  I have reviewed the triage vital signs and the nursing notes.  Pertinent labs & imaging results that were available during my care of the patient were reviewed by me and considered in my medical decision making (see chart for details).    MDM Rules/Calculators/A&P                         30 year old female with a history of migraine, anxiety who presents the emergency department with a chief complaint of chest pain.  Patient reports that she has been feeling an intermittent twinge of chest pain in the left side of her chest.  She also recently started having intermittent palpitations.  No shortness of breath, constitutional symptoms, dizziness, lightheadedness.  Tachycardic in the 110s on arrival, tachycardia improved to ~100 without treatment.  Patient does acknowledge that she is very anxious.  Wells score is 1.5, 1.3% chance of PE in the low risk group.  Labs and imaging of been reviewed and independently interpreted by me.  EKG with sinus rhythm.  She did have some nonspecific ST and T wave abnormalities.  She can follow-up with primary care she may require a cardiology referral for an echo.  Delta troponin is not elevated.  Hear score is 1.  Chest x-ray is unremarkable.  She has anemia, but this is stable from previous.  No metabolic derangements.  Doubt ACS, PE, aortic dissection, tension pneumothorax, esophageal rupture.  Did consider hyperthyroidism, but she does not appear to be an thyroid storm or thyrotoxicosis.  She appears stable for primary care follow-up.  At this time, she is hemodynamically stable in no acute distress.  Safe for discharged home with outpatient follow-up as discussed.  ER return precautions given.    Final Clinical Impression(s) / ED Diagnoses Final diagnoses:  Palpitations  Atypical chest pain  Elevated blood pressure reading    Rx / DC Orders ED Discharge Orders          Ordered    methocarbamol  (ROBAXIN) 500 MG tablet  2 times daily        04/16/21 0446             Frederik Pear A, PA-C 04/16/21 0459    Glynn Octave, MD 04/16/21 [phone]

## 2021-04-16 NOTE — Discharge Instructions (Addendum)
Thank you for allowing me to care for you today in the Emergency Department.   Please keep your follow-up appointment with primary care on Friday.  They may want to refer you to cardiology to have an echo of your heart.  Your blood pressure was elevated today.  Please have them recheck this in the office on Friday.  Your heart rate was also fast, but this seemed to improve.  Please have them recheck this in the office on Friday.  Take 650 mg of Tylenol or 600 mg of ibuprofen with food every 6 hours for pain.  You can alternate between these 2 medications every 3 hours if your pain returns.  For instance, you can take Tylenol at noon, followed by a dose of ibuprofen at 3, followed by second dose of Tylenol and 6.   Take 1 tablet of Robaxin 2 times daily for muscle pain or spasms.  Return to the emergency department if you pass out, if you develop severe, constant chest pain with respiratory distress, vomiting, new numbness or weakness, or other new, concerning symptoms.

## 2021-04-16 NOTE — ED Provider Notes (Signed)
Emergency Medicine Provider Triage Evaluation Note  Courtney [name] , a 30 y.o. female  was evaluated in triage.  Pt complains of chest pain x 1 week. Describes a "twinge" in her L chest that comes and goes. Brought on when patient is stressed and anxious. Has had some soreness in the LUE and palpitations. Denies fever, syncope, leg swelling, hemoptysis, and FHx of sudden cardiac death.  Review of Systems  Positive: Chest pain Negative: LOC, fever  Physical Exam  BP (!) 154/93 (BP Location: Left Arm)   Pulse (!) 113   Temp 99.1 F (37.3 C) (Oral)   Resp 18   SpO2 100%  Gen:   Awake, no distress   Resp:  Normal effort  MSK:   Moves extremities without difficulty  Other:  Heart RRR. Lungs CTAB.  Medical Decision Making  Medically screening exam initiated at 1:34 AM.  Appropriate orders placed.  Courtney [name] was informed that the remainder of the evaluation will be completed by another provider, this initial triage assessment does not replace that evaluation, and the importance of remaining in the ED until their evaluation is complete.  Chest pain   Antony Madura, PA-C 04/16/21 0201    Dione Booze, MD 04/16/21 [phone]

## 2021-04-16 NOTE — ED Notes (Signed)
Patient verbalized understanding of discharge instructions. Opportunity for questions and answers.  

## 2021-06-06 ENCOUNTER — Other Ambulatory Visit: Payer: Self-pay

## 2021-06-06 ENCOUNTER — Ambulatory Visit (INDEPENDENT_AMBULATORY_CARE_PROVIDER_SITE_OTHER): Payer: Medicaid Other

## 2021-06-06 DIAGNOSIS — Z3200 Encounter for pregnancy test, result unknown: Secondary | ICD-10-CM

## 2021-06-06 DIAGNOSIS — Z3201 Encounter for pregnancy test, result positive: Secondary | ICD-10-CM

## 2021-06-06 LAB — POCT PREGNANCY, URINE: Preg Test, Ur: POSITIVE — AB

## 2021-06-06 NOTE — Progress Notes (Signed)
Possible Pregnancy  Here today for pregnancy confirmation. UPT in office today is faintly positive. Pt reports first positive home UPT one week prior. Reviewed dating with patient:   LMP: 04/26/21  EDD: 01/31/22 5w 6d today  Recommended pt take home UPT in 1 week to confirm faintly positive result today. OB history reviewed. Reviewed medications and allergies with patient; list of medications safe to take during pregnancy given.  Recommended pt begin prenatal vitamin and schedule prenatal care. Pt reports pelvic pain prior to positive UPT at home. Reports brief sharp pain yesterday. Denies any pain today. Reports no vaginal bleeding. Ectopic precautions reviewed with patient. Explained that patient should present to MAU if pain returns or pt experiences vaginal bleeding.  Marjo Bicker, RN 06/06/2021  12:22 PM

## 2021-06-09 ENCOUNTER — Inpatient Hospital Stay (HOSPITAL_COMMUNITY): Payer: Medicaid Other

## 2021-06-09 ENCOUNTER — Encounter (HOSPITAL_COMMUNITY): Payer: Self-pay | Admitting: *Deleted

## 2021-06-09 ENCOUNTER — Inpatient Hospital Stay (HOSPITAL_COMMUNITY)
Admission: AD | Admit: 2021-06-09 | Discharge: 2021-06-09 | Disposition: A | Discharge status: Home / Self Care | Payer: Medicaid Other | Attending: Family Medicine | Admitting: Family Medicine

## 2021-06-09 DIAGNOSIS — O3680X0 Pregnancy with inconclusive fetal viability, not applicable or unspecified: Secondary | ICD-10-CM

## 2021-06-09 DIAGNOSIS — O26899 Other specified pregnancy related conditions, unspecified trimester: Secondary | ICD-10-CM

## 2021-06-09 DIAGNOSIS — Z3A01 Less than 8 weeks gestation of pregnancy: Secondary | ICD-10-CM | POA: Diagnosis not present

## 2021-06-09 DIAGNOSIS — R109 Unspecified abdominal pain: Secondary | ICD-10-CM

## 2021-06-09 DIAGNOSIS — O26891 Other specified pregnancy related conditions, first trimester: Secondary | ICD-10-CM | POA: Diagnosis not present

## 2021-06-09 LAB — CBC WITH DIFFERENTIAL/PLATELET
Abs Immature Granulocytes: 0.01 10*3/uL (ref 0.00–0.07)
Basophils Absolute: 0.1 10*3/uL (ref 0.0–0.1)
Basophils Relative: 1 %
Eosinophils Absolute: 0.1 10*3/uL (ref 0.0–0.5)
Eosinophils Relative: 2 %
HCT: 30 % — ABNORMAL LOW (ref 36.0–46.0)
Hemoglobin: 9.5 g/dL — ABNORMAL LOW (ref 12.0–15.0)
Immature Granulocytes: 0 %
Lymphocytes Relative: 38 %
Lymphs Abs: 2.9 10*3/uL (ref 0.7–4.0)
MCH: 24.8 pg — ABNORMAL LOW (ref 26.0–34.0)
MCHC: 31.7 g/dL (ref 30.0–36.0)
MCV: 78.3 fL — ABNORMAL LOW (ref 80.0–100.0)
Monocytes Absolute: 0.6 10*3/uL (ref 0.1–1.0)
Monocytes Relative: 9 %
Neutro Abs: 3.8 10*3/uL (ref 1.7–7.7)
Neutrophils Relative %: 50 %
Platelets: 359 10*3/uL (ref 150–400)
RBC: 3.83 MIL/uL — ABNORMAL LOW (ref 3.87–5.11)
RDW: 18.3 % — ABNORMAL HIGH (ref 11.5–15.5)
WBC: 7.4 10*3/uL (ref 4.0–10.5)
nRBC: 0 % (ref 0.0–0.2)

## 2021-06-09 LAB — COMPREHENSIVE METABOLIC PANEL
ALT: 12 U/L (ref 0–44)
AST: 19 U/L (ref 15–41)
Albumin: 3.9 g/dL (ref 3.5–5.0)
Alkaline Phosphatase: 62 U/L (ref 38–126)
Anion gap: 4 — ABNORMAL LOW (ref 5–15)
BUN: 10 mg/dL (ref 6–20)
CO2: 25 mmol/L (ref 22–32)
Calcium: 8.9 mg/dL (ref 8.9–10.3)
Chloride: 105 mmol/L (ref 98–111)
Creatinine, Ser: 0.71 mg/dL (ref 0.44–1.00)
GFR, Estimated: >60 mL/min (ref >60)
Glucose, Bld: 106 mg/dL — ABNORMAL HIGH (ref 70–99)
Potassium: 3.4 mmol/L — ABNORMAL LOW (ref 3.5–5.1)
Sodium: 134 mmol/L — ABNORMAL LOW (ref 135–145)
Total Bilirubin: 0.5 mg/dL (ref 0.3–1.2)
Total Protein: 7.7 g/dL (ref 6.5–8.1)

## 2021-06-09 LAB — URINALYSIS, ROUTINE W REFLEX MICROSCOPIC
Bilirubin Urine: NEGATIVE
Glucose, UA: NEGATIVE mg/dL
Hgb urine dipstick: NEGATIVE
Ketones, ur: NEGATIVE mg/dL
Leukocytes,Ua: NEGATIVE
Nitrite: NEGATIVE
Protein, ur: NEGATIVE mg/dL
Specific Gravity, Urine: 1.03 (ref 1.005–1.030)
pH: 5 (ref 5.0–8.0)

## 2021-06-09 LAB — HIV ANTIBODY (ROUTINE TESTING W REFLEX): HIV Screen 4th Generation wRfx: NONREACTIVE

## 2021-06-09 LAB — HCG, QUANTITATIVE, PREGNANCY: hCG, Beta Chain, Quant, S: 144 m[IU]/mL — ABNORMAL HIGH (ref <5)

## 2021-06-09 NOTE — MAU Note (Signed)
+  test at clinic last wk.  Been having pain in LLQ, sharp pains. No bleeding.

## 2021-06-09 NOTE — MAU Provider Note (Signed)
History     CSN: [id number]  Arrival date and time: 06/09/21 1622   Event Date/Time   First Provider Initiated Contact with Patient 06/09/21 2017      No chief complaint on file.  HPI  Ms.Courtney [name] Is a 30 y.o. female [phone] @ Unknown here in MAU with complaints of lower abdominal pain.   She reports lower abdomina pain since August 6. She rates her pain 6/10 at its worst. The pain is throbbing.  She has not taken anything for the pain.   No bleeding.   OB History     Gravida  5   Para  3   Term  3   Preterm      AB  1   Living  4      SAB  1   IAB      Ectopic      Multiple  1   Live Births  4           Past Medical History:  Diagnosis Date   Anemia    Migraine    Trichomonal vaginitis during pregnancy     Past Surgical History:  Procedure Laterality Date   NO PAST SURGERIES     WISDOM TOOTH EXTRACTION      Family History  Problem Relation Age of Onset   Diabetes Mother    Multiple sclerosis Mother    Fibromyalgia Mother    Kidney disease Father    Hypertension Father    Diabetes Maternal Uncle    Diabetes Maternal Grandmother    Cancer Paternal Grandfather        lung   Autism Paternal Uncle        half-uncle ( husbands brother)    Hypertension Maternal Grandfather    Heart attack Maternal Grandfather    Stroke Maternal Grandfather    Hypertension Paternal Grandmother    Stroke Paternal Grandmother    Heart disease Paternal Grandmother     Social History   Tobacco Use   Smoking status: Never   Smokeless tobacco: Never  Vaping Use   Vaping Use: Never used  Substance Use Topics   Alcohol use: No   Drug use: No    Allergies: No Known Allergies  Medications Prior to Admission  Medication Sig Dispense Refill Last Dose   Prenatal Vit-Fe Fumarate-FA (PRENATAL MULTIVITAMIN) TABS tablet Take 1 tablet by mouth daily at 12 noon.      Results for orders placed or performed during the hospital encounter of 06/09/21  (from the past 48 hour(s))  Urinalysis, Routine w reflex microscopic Urine, Clean Catch     Status: None   Collection Time: 06/09/21  4:56 PM  Result Value Ref Range   Color, Urine YELLOW YELLOW   APPearance CLEAR CLEAR   Specific Gravity, Urine 1.030 1.005 - 1.030   pH 5.0 5.0 - 8.0   Glucose, UA NEGATIVE NEGATIVE mg/dL   Hgb urine dipstick NEGATIVE NEGATIVE   Bilirubin Urine NEGATIVE NEGATIVE   Ketones, ur NEGATIVE NEGATIVE mg/dL   Protein, ur NEGATIVE NEGATIVE mg/dL   Nitrite NEGATIVE NEGATIVE   Leukocytes,Ua NEGATIVE NEGATIVE    Comment: Performed at [facility], [address] [place], [place] [address]  CBC with Differential/Platelet     Status: Abnormal   Collection Time: 06/09/21  5:15 PM  Result Value Ref Range   WBC 7.4 4.0 - 10.5 K/uL   RBC 3.83 (L) 3.87 - 5.11 MIL/uL   Hemoglobin 9.5 (L) 12.0 -  15.0 g/dL   HCT 26.3 (L) 33.5 - 45.6 %   MCV 78.3 (L) 80.0 - 100.0 fL   MCH 24.8 (L) 26.0 - 34.0 pg   MCHC 31.7 30.0 - 36.0 g/dL   RDW 25.6 (H) 38.9 - 37.3 %   Platelets 359 150 - 400 K/uL   nRBC 0.0 0.0 - 0.2 %   Neutrophils Relative % 50 %   Neutro Abs 3.8 1.7 - 7.7 K/uL   Lymphocytes Relative 38 %   Lymphs Abs 2.9 0.7 - 4.0 K/uL   Monocytes Relative 9 %   Monocytes Absolute 0.6 0.1 - 1.0 K/uL   Eosinophils Relative 2 %   Eosinophils Absolute 0.1 0.0 - 0.5 K/uL   Basophils Relative 1 %   Basophils Absolute 0.1 0.0 - 0.1 K/uL   Immature Granulocytes 0 %   Abs Immature Granulocytes 0.01 0.00 - 0.07 K/uL    Comment: Performed at [facility], [address] [place], [place] [address]  Comprehensive metabolic panel     Status: Abnormal   Collection Time: 06/09/21  5:15 PM  Result Value Ref Range   Sodium 134 (L) 135 - 145 mmol/L   Potassium 3.4 (L) 3.5 - 5.1 mmol/L   Chloride 105 98 - 111 mmol/L   CO2 25 22 - 32 mmol/L   Glucose, Bld 106 (H) 70 - 99 mg/dL    Comment: Glucose reference range applies only to samples taken after fasting for at least 8  hours.   BUN 10 6 - 20 mg/dL   Creatinine, Ser 8.11 0.44 - 1.00 mg/dL   Calcium 8.9 8.9 - 57.2 mg/dL   Total Protein 7.7 6.5 - 8.1 g/dL   Albumin 3.9 3.5 - 5.0 g/dL   AST 19 15 - 41 U/L   ALT 12 0 - 44 U/L   Alkaline Phosphatase 62 38 - 126 U/L   Total Bilirubin 0.5 0.3 - 1.2 mg/dL   GFR, Estimated >62 >03 mL/min    Comment: (NOTE) Calculated using the CKD-EPI Creatinine Equation (2021)    Anion gap 4 (L) 5 - 15    Comment: Performed at [facility], [address] [place], [place] [address]  hCG, quantitative, pregnancy     Status: Abnormal   Collection Time: 06/09/21  5:15 PM  Result Value Ref Range   hCG, Beta Chain, Quant, S 144 (H) <5 mIU/mL    Comment:          GEST. AGE      CONC.  (mIU/mL)   <=1 WEEK        5 - 50     2 WEEKS       50 - 500     3 WEEKS       100 - 10,000     4 WEEKS     1,000 - 30,000     5 WEEKS     3,500 - 115,000   6-8 WEEKS     12,000 - 270,000    12 WEEKS     15,000 - 220,000        FEMALE AND NON-PREGNANT FEMALE:     LESS THAN 5 mIU/mL Performed at [facility], [address] [place], [place] [address]   HIV Antibody (routine testing w rflx)     Status: None   Collection Time: 06/09/21  5:15 PM  Result Value Ref Range   HIV Screen 4th Generation wRfx Non Reactive Non Reactive  Comment: Performed at [facility], [address], [place], [place] [address]    US OB LESS THAN 14 WEEKS WITH [place] TRANSVAGINAL  Result Date: 06/09/2021 CLINICAL DATA:  Abdominal pain affecting pregnancy. Estimated gestational age by LMP is 6 weeks 2 days. Quantitative beta HCG is pending. Patient is unsure of periods, very irregular. EXAM: OBSTETRIC <14 WK [place] AND TRANSVAGINAL OB US TECHNIQUE: Both transabdominal and transvaginal ultrasound examinations were performed for complete evaluation of the gestation as well as the maternal uterus, adnexal regions, and pelvic cul-de-sac. Transvaginal technique was performed to assess early pregnancy.  COMPARISON:  None. FINDINGS: Intrauterine gestational sac: None Yolk sac:  Not Visualized. Embryo:  Not Visualized. Cardiac Activity: Not Visualized. Maternal uterus/adnexae: Uterus is anteverted. No myometrial mass lesions are identified. Endometrial stripe is homogeneous with normal thickness. Both ovaries are visualized. There is a tiny 9 mm cystic collection in the right ovary which probably represents a corpus luteal cyst. Minimal free fluid in the pelvis. IMPRESSION: No intrauterine gestational sac, yolk sac, or fetal pole identified. Differential considerations include intrauterine pregnancy too early to be sonographically visualized, missed abortion, or ectopic pregnancy. Followup ultrasound is recommended in 10-14 days for further evaluation. Electronically Signed   By: Burman Nieves M.D.   On: 06/09/2021 18:59     Review of Systems  Gastrointestinal:  Negative for abdominal pain.  Genitourinary:  Negative for vaginal bleeding.  Physical Exam   Blood pressure (!) 137/93, pulse 93, temperature 99 F (37.2 C), resp. rate 16, height 5\' 2"  (1.575 m), weight 53.1 kg, last menstrual period 04/26/2021, SpO2 100 %, unknown if currently breastfeeding.  Physical Exam Vitals and nursing note reviewed.  Constitutional:      General: She is not in acute distress.    Appearance: She is well-developed. She is not ill-appearing, toxic-appearing or diaphoretic.  HENT:     Head: Normocephalic.  Cardiovascular:     Rate and Rhythm: Normal rate.  Abdominal:     Tenderness: There is no abdominal tenderness. There is no guarding or rebound.  Genitourinary:    Comments: Wet prep and GC collected without speculum.  Collected by 06/27/2021, NP  Neurological:     Mental Status: She is alert and oriented to person, place, and time.  Psychiatric:        Mood and Affect: Mood is not anxious.   MAU Course  Procedures None  MDM  Wet prep & GC HIV, CBC, Hcg, ABO [facility] transvaginal     Assessment and Plan   A:  1. Pregnancy of unknown anatomic location   2. Abdominal pain affecting pregnancy   3. [redacted] weeks gestation of pregnancy     P:  Discharge home in stable condition Return to MAU if symptoms worsen Start prenatal care.  Reviewed [place] results with patient Go to [facility] on T 8/24 for repeat stat quant.  Ectopic precautions.    Bianca Raneri, 9/24 I, NP.td 1:19 PM

## 2021-06-12 ENCOUNTER — Other Ambulatory Visit: Payer: Self-pay

## 2021-06-12 ENCOUNTER — Ambulatory Visit (INDEPENDENT_AMBULATORY_CARE_PROVIDER_SITE_OTHER): Payer: Medicaid Other

## 2021-06-12 VITALS — BP 120/70 | HR 75 | Ht 62.0 in | Wt 117.0 lb

## 2021-06-12 DIAGNOSIS — O26899 Other specified pregnancy related conditions, unspecified trimester: Secondary | ICD-10-CM

## 2021-06-12 DIAGNOSIS — R109 Unspecified abdominal pain: Secondary | ICD-10-CM

## 2021-06-12 LAB — BETA HCG QUANT (REF LAB): hCG Quant: 674 m[IU]/mL

## 2021-06-12 NOTE — Progress Notes (Signed)
Pt here today for STAT Beta from MAU follow up on 06/09/2021. Beta at [facility] was 144. + UPT 06/04/21  Pt denies any vaginal bleeding, pain or cramps. Pt advised after results come back from Beta, will be contacted via phone with plan of care per Dr Vergie Living. Pt verbalized understanding.    Judeth Cornfield, RN

## 2021-06-12 NOTE — Progress Notes (Signed)
Reviewed results with Dr Vergie Living. Pt advised that results are normal rise and to have follow up OB US in 10-14 days.   Call placed to pt. Spoke with pt, pt given results and recommendations per Dr Vergie Living. Pt agreeable to plan of care. Pt given OB US appt as well.  OB US scheduled for 9/12 @ 9am.  Judeth Cornfield, RN

## 2021-06-16 NOTE — Progress Notes (Signed)
Patient was assessed and managed by nursing staff during this encounter. I have reviewed the chart and agree with the documentation and plan. I have also made any necessary editorial changes.  Kaydee Magel, MD 06/16/2021 9:46 PM   

## 2021-06-24 ENCOUNTER — Telehealth: Payer: Self-pay

## 2021-06-24 DIAGNOSIS — O219 Vomiting of pregnancy, unspecified: Secondary | ICD-10-CM

## 2021-06-24 MED ORDER — DOXYLAMINE-PYRIDOXINE 10-10 MG PO TBEC: 2.0000 | DELAYED_RELEASE_TABLET | Freq: Every day | ORAL | 2 refills | Status: DC

## 2021-06-24 NOTE — Telephone Encounter (Signed)
Pt left VM on nurse line requesting new nausea medication. Reports trying Unisom and vitamin B6 with no improvement. Called pt to follow up. Pt states she also tried Dramamine as this was recommended by PCP with no improvement. States she used Diclegis in prior pregnancy with good results. Diclegis ordered per protocol.

## 2021-06-24 NOTE — Telephone Encounter (Signed)
Patient is calling wanting to know if the provider would send in something for nausea   [facility]  [address]  [facility]   Please call and advise

## 2021-06-30 ENCOUNTER — Ambulatory Visit
Admission: RE | Admit: 2021-06-30 | Discharge: 2021-06-30 | Disposition: A | Discharge status: Home / Self Care | Payer: Medicaid Other | Source: Ambulatory Visit | Attending: Obstetrics and Gynecology | Admitting: Obstetrics and Gynecology

## 2021-06-30 ENCOUNTER — Other Ambulatory Visit: Payer: Self-pay

## 2021-06-30 ENCOUNTER — Ambulatory Visit: Payer: Medicaid Other | Admitting: Obstetrics & Gynecology

## 2021-06-30 DIAGNOSIS — R109 Unspecified abdominal pain: Secondary | ICD-10-CM | POA: Diagnosis present

## 2021-06-30 DIAGNOSIS — O26899 Other specified pregnancy related conditions, unspecified trimester: Secondary | ICD-10-CM | POA: Diagnosis not present

## 2021-07-01 ENCOUNTER — Telehealth: Payer: Self-pay | Admitting: Obstetrics and Gynecology

## 2021-07-01 NOTE — Telephone Encounter (Signed)
Reviewed normal [place] results. Patient has not selected an OB provider. She was once a patient of [facility], but states that "they no longer take my insurance." CNM will send [facility] provider list to patient for selection of an office of her choice. Patient verbalized an understanding of the plan of care and agrees.   Raelyn Mora, CNM  07/01/2021 3:52 PM

## 2021-07-14 ENCOUNTER — Telehealth (INDEPENDENT_AMBULATORY_CARE_PROVIDER_SITE_OTHER): Payer: Medicaid Other

## 2021-07-14 DIAGNOSIS — O0941 Supervision of pregnancy with grand multiparity, first trimester: Secondary | ICD-10-CM

## 2021-07-14 DIAGNOSIS — Z3A00 Weeks of gestation of pregnancy not specified: Secondary | ICD-10-CM

## 2021-07-14 DIAGNOSIS — Z8759 Personal history of other complications of pregnancy, childbirth and the puerperium: Secondary | ICD-10-CM

## 2021-07-14 DIAGNOSIS — O0993 Supervision of high risk pregnancy, unspecified, third trimester: Secondary | ICD-10-CM | POA: Insufficient documentation

## 2021-07-14 DIAGNOSIS — Z3491 Encounter for supervision of normal pregnancy, unspecified, first trimester: Secondary | ICD-10-CM | POA: Insufficient documentation

## 2021-07-14 DIAGNOSIS — O0991 Supervision of high risk pregnancy, unspecified, first trimester: Secondary | ICD-10-CM | POA: Insufficient documentation

## 2021-07-14 MED ORDER — BLOOD PRESSURE KIT DEVI: 1.0000 | Freq: Once | 0 refills | Status: AC

## 2021-07-14 NOTE — Progress Notes (Signed)
New OB Intake  I connected with  Courtney [name] on 07/14/21 at  3:15 PM EDT by MyChart Video Visit and verified that I am speaking with the correct person using two identifiers. Nurse is located at [facility] and pt is located at home.  I discussed the limitations, risks, security and privacy concerns of performing an evaluation and management service by telephone and the availability of in person appointments. I also discussed with the patient that there may be a patient responsible charge related to this service. The patient expressed understanding and agreed to proceed.  I explained I am completing New OB Intake today. We discussed her EDD of 02/18/21 that is based on [place] room 06/30/21. Pt is G5/P3014. I reviewed her allergies, medications, Medical/Surgical/OB history, and appropriate screenings. I informed her of [facility] services. [facility] information placed in AVS. Based on history, this is a low risk pregnancy.  Patient Active Problem List   Diagnosis Date Noted   Supervision of low-risk pregnancy, first trimester 07/14/2021   Anxiety 10/11/2019   Migraine hx 10/11/2019   History of prior pregnancy with IUGR newborn 10/11/2019   Concerns addressed today Nausea: currently taking Diclegis for nausea, reports this has improved greatly in the past 2 days. Encouraged pt to call if she needs any additional assistance with nausea.   Patient reports history of elevated blood pressures both in pregnancy and outside of pregnancy. Reports no diagnosis of chronic hypertension. Patient may need baseline hypertension labs at new ob.   Delivery Plans:  Plans to deliver at [facility].   MyChart/Babyscripts MyChart access verified. Babyscripts instructions given and order placed.   Blood Pressure Cuff  Blood pressure cuff ordered for patient to pick-up from [employer]. Explained after first prenatal appt pt will check weekly and document in Babyscripts.  Weight scale: Patient does have weight scale. Explained  pt can document weekly in Babyscripts.   Anatomy US Explained first scheduled [place] will be around 19 weeks. Anatomy US scheduled for 09/22/21 at 1015. Pt notified to arrive at 1000.  Labs Discussed Avelina Laine genetic screening with patient. Would like both Panorama and Horizon drawn at new OB visit. Routine prenatal labs needed.  COVID Vaccine Patient has not had COVID vaccine.   Mother/ Baby Dyad Candidate?    No  Social Determinants of Health Food Insecurity: Patient denies food insecurity. WIC Referral: Patient is not interested in referral to [facility].  Transportation: Patient denies transportation needs. Childcare: Discussed no children allowed at ultrasound appointments. Offered childcare services; patient declines childcare services at this time.  First visit review I reviewed new OB appt with pt. I explained she will have a provider visit that will include physical exam and ob bloodwork with genetic screening. Explained pt will be seen by Vonzella Nipple, PA at first visit; encounter routed to appropriate provider. Explained that patient will be seen by pregnancy navigator following visit with provider.   Marjo Bicker, RN 07/14/2021  5:16 PM

## 2021-07-15 NOTE — Progress Notes (Signed)
Chart reviewed for nurse visit. Agree with plan of care.   Marny Lowenstein, PA-C 07/15/2021 10:08 AM

## 2021-07-28 ENCOUNTER — Other Ambulatory Visit: Payer: Self-pay

## 2021-07-28 ENCOUNTER — Ambulatory Visit (INDEPENDENT_AMBULATORY_CARE_PROVIDER_SITE_OTHER): Payer: Medicaid Other | Admitting: Medical

## 2021-07-28 ENCOUNTER — Encounter: Payer: Self-pay | Admitting: Medical

## 2021-07-28 VITALS — BP 133/85 | HR 97 | Wt 130.0 lb

## 2021-07-28 DIAGNOSIS — O0991 Supervision of high risk pregnancy, unspecified, first trimester: Secondary | ICD-10-CM

## 2021-07-28 DIAGNOSIS — O99011 Anemia complicating pregnancy, first trimester: Secondary | ICD-10-CM

## 2021-07-28 DIAGNOSIS — Z8759 Personal history of other complications of pregnancy, childbirth and the puerperium: Secondary | ICD-10-CM | POA: Insufficient documentation

## 2021-07-28 DIAGNOSIS — Z3A10 10 weeks gestation of pregnancy: Secondary | ICD-10-CM

## 2021-07-28 MED ORDER — BLOOD PRESSURE KIT DEVI: 1.0000 | 0 refills | Status: DC | PRN

## 2021-07-28 MED ORDER — PREPLUS 27-1 MG PO TABS: 1.0000 | ORAL_TABLET | Freq: Every day | ORAL | 12 refills | Status: DC

## 2021-07-28 NOTE — Progress Notes (Signed)
   PRENATAL VISIT NOTE  Subjective:  Courtney [name] is a 30 y.o. [id number] at [redacted]w[redacted]d being seen today for her first prenatal visit for this pregnancy.  She is currently monitored for the following issues for this high-risk pregnancy and has Anxiety; Migraine hx; History of prior pregnancy with IUGR newborn; Supervision of high risk pregnancy, antepartum, first trimester; and History of twin pregnancy in prior pregnancy on their problem list.  Patient reports no complaints.  Contractions: Not present. Vag. Bleeding: None.  Movement: Absent. Denies leaking of fluid.   She is planning to both breast and bottle feed. Desires BTL for contraception.   The following portions of the patient's history were reviewed and updated as appropriate: allergies, current medications, past family history, past medical history, past social history, past surgical history and problem list.   Objective:   Vitals:   07/28/21 1402  BP: 133/85  Pulse: 97  Weight: 130 lb (59 kg)    Fetal Status: Fetal Heart Rate (bpm): 181   Movement: Absent     General:  Alert, oriented and cooperative. Patient is in no acute distress.  Skin: Skin is warm and dry. No rash noted.   Cardiovascular: Normal heart rate and rhythm noted  Respiratory: Normal respiratory effort, no problems with respiration noted. Clear to auscultation.   Abdomen: Soft, gravid, appropriate for gestational age. Normal bowel sounds. Non-tender. Pain/Pressure: Absent     Pelvic: Cervical exam deferred        Extremities: Normal range of motion.  Edema: None  Mental Status: Normal mood and affect. Normal behavior. Normal judgment and thought content.   Assessment and Plan:  Pregnancy: [id number] at [redacted]w[redacted]d 1. History of twin pregnancy in prior pregnancy  2. Supervision of high risk pregnancy, antepartum, first trimester - Genetic Screening - CBC/D/Plt+RPR+Rh+ABO+RubIgG... - Culture, OB Urine - Anatomy US scheduled 09/21/21 - Previous negative carrier  screening in Epic  - NIPS today   3. History of prior pregnancy with IUGR newborn - All prior pregnancies have resulted in full-term deliveries with babies ranging from 5lb 4 oz to 5 lb 11 oz  4. [redacted] weeks gestation of pregnancy  Preterm labor/first trimester warning symptoms and general obstetric precautions including but not limited to vaginal bleeding, contractions, leaking of fluid and fetal movement were reviewed in detail with the patient. Please refer to After Visit Summary for other counseling recommendations.   Discussed the normal visit cadence for prenatal care Discussed the nature of our practice with multiple providers including residents and students   Return in about 4 weeks (around 08/25/2021) for [facility] APP, In-Person.  Future Appointments  Date Time Provider Department Center  09/22/2021 10:00 AM [facility] NURSE [facility]  09/22/2021 10:15 AM [facility] US2 [facility]    Vonzella Nipple, PA-C

## 2021-07-28 NOTE — Patient Instructions (Signed)
[facility] for blood pressure cuff:  [address] [place], [place], [place] [address]

## 2021-07-29 ENCOUNTER — Encounter: Payer: Self-pay | Admitting: Medical

## 2021-07-29 DIAGNOSIS — D509 Iron deficiency anemia, unspecified: Secondary | ICD-10-CM | POA: Insufficient documentation

## 2021-07-29 DIAGNOSIS — O99011 Anemia complicating pregnancy, first trimester: Secondary | ICD-10-CM | POA: Insufficient documentation

## 2021-07-29 LAB — HCV INTERPRETATION

## 2021-07-29 LAB — CBC/D/PLT+RPR+RH+ABO+RUBIGG...
Antibody Screen: NEGATIVE
Basophils Absolute: 0.1 10*3/uL (ref 0.0–0.2)
Basos: 1 %
EOS (ABSOLUTE): 0.1 10*3/uL (ref 0.0–0.4)
Eos: 2 %
HCV Ab: <0.1 s/co ratio (ref 0.0–0.9)
HIV Screen 4th Generation wRfx: NONREACTIVE
Hematocrit: 27.7 % — ABNORMAL LOW (ref 34.0–46.6)
Hemoglobin: 9 g/dL — ABNORMAL LOW (ref 11.1–15.9)
Hepatitis B Surface Ag: NEGATIVE
Immature Grans (Abs): 0 10*3/uL (ref 0.0–0.1)
Immature Granulocytes: 0 %
Lymphocytes Absolute: 2 10*3/uL (ref 0.7–3.1)
Lymphs: 26 %
MCH: 24.6 pg — ABNORMAL LOW (ref 26.6–33.0)
MCHC: 32.5 g/dL (ref 31.5–35.7)
MCV: 76 fL — ABNORMAL LOW (ref 79–97)
Monocytes Absolute: 0.7 10*3/uL (ref 0.1–0.9)
Monocytes: 9 %
Neutrophils Absolute: 4.8 10*3/uL (ref 1.4–7.0)
Neutrophils: 62 %
Platelets: 316 10*3/uL (ref 150–450)
RBC: 3.66 x10E6/uL — ABNORMAL LOW (ref 3.77–5.28)
RDW: 19.1 % — ABNORMAL HIGH (ref 11.7–15.4)
RPR Ser Ql: NONREACTIVE
Rh Factor: POSITIVE
Rubella Antibodies, IGG: 6.84 index (ref >0.99)
WBC: 7.7 10*3/uL (ref 3.4–10.8)

## 2021-07-29 MED ORDER — FERROUS SULFATE 325 (65 FE) MG PO TABS: 325.0000 mg | ORAL_TABLET | ORAL | 11 refills | Status: DC

## 2021-07-29 NOTE — Addendum Note (Signed)
Addended by: Marny Lowenstein on: 07/29/2021 08:27 AM   Modules accepted: Orders

## 2021-07-30 LAB — URINE CULTURE, OB REFLEX

## 2021-07-30 LAB — CULTURE, OB URINE

## 2021-08-07 ENCOUNTER — Encounter: Payer: Self-pay | Admitting: *Deleted

## 2021-08-25 ENCOUNTER — Other Ambulatory Visit: Payer: Self-pay

## 2021-08-25 ENCOUNTER — Ambulatory Visit (INDEPENDENT_AMBULATORY_CARE_PROVIDER_SITE_OTHER): Payer: Medicaid Other | Admitting: Obstetrics & Gynecology

## 2021-08-25 VITALS — BP 130/81 | HR 86 | Wt 136.0 lb

## 2021-08-25 DIAGNOSIS — Z8759 Personal history of other complications of pregnancy, childbirth and the puerperium: Secondary | ICD-10-CM

## 2021-08-25 DIAGNOSIS — O99011 Anemia complicating pregnancy, first trimester: Secondary | ICD-10-CM

## 2021-08-25 DIAGNOSIS — O0991 Supervision of high risk pregnancy, unspecified, first trimester: Secondary | ICD-10-CM

## 2021-08-25 MED ORDER — FERROUS SULFATE 325 (65 FE) MG PO TABS: 325.0000 mg | ORAL_TABLET | ORAL | 11 refills | Status: DC

## 2021-08-25 NOTE — Progress Notes (Signed)
   PRENATAL VISIT NOTE  Subjective:  Courtney [name] is a 30 y.o. [id number] at [redacted]w[redacted]d being seen today for ongoing prenatal care.  She is currently monitored for the following issues for this high-risk pregnancy and has Anxiety; Migraine hx; History of prior pregnancy with IUGR newborn; Supervision of high risk pregnancy, antepartum, first trimester; History of twin pregnancy in prior pregnancy; and Anemia in pregnancy, first trimester on their problem list.  Patient reports no complaints.  Contractions: Not present. Vag. Bleeding: None.  Movement: Absent. Denies leaking of fluid.   The following portions of the patient's history were reviewed and updated as appropriate: allergies, current medications, past family history, past medical history, past social history, past surgical history and problem list.   Objective:   Vitals:   08/25/21 1051  BP: 130/81  Pulse: 86  Weight: 136 lb (61.7 kg)    Fetal Status: Fetal Heart Rate (bpm): 152 Fundal Height: 15 cm Movement: Absent     General:  Alert, oriented and cooperative. Patient is in no acute distress.  Skin: Skin is warm and dry. No rash noted.   Cardiovascular: Normal heart rate noted  Respiratory: Normal respiratory effort, no problems with respiration noted  Abdomen: Soft, gravid, appropriate for gestational age.  Pain/Pressure: Absent     Pelvic: Cervical exam deferred        Extremities: Normal range of motion.  Edema: None  Mental Status: Normal mood and affect. Normal behavior. Normal judgment and thought content.   Assessment and Plan:  Pregnancy: [id number] at [redacted]w[redacted]d 1. Supervision of high risk pregnancy, antepartum, first trimester Second trimester  2. History of prior pregnancy with IUGR newborn Will follow growth  3. Anemia in pregnancy, first trimester  - ferrous sulfate 325 (65 FE) MG tablet; Take 1 tablet (325 mg total) by mouth every other day.  Dispense: 15 tablet; Refill: 11  Preterm labor symptoms and general  obstetric precautions including but not limited to vaginal bleeding, contractions, leaking of fluid and fetal movement were reviewed in detail with the patient. Please refer to After Visit Summary for other counseling recommendations.   Return in about 4 weeks (around 09/22/2021).  Future Appointments  Date Time Provider Department Center  09/22/2021 10:00 AM [facility] NURSE [facility]  09/22/2021 10:15 AM [facility] US2 [facility]    Scheryl Darter, MD

## 2021-09-22 ENCOUNTER — Other Ambulatory Visit: Payer: Self-pay

## 2021-09-22 ENCOUNTER — Ambulatory Visit: Payer: Medicaid Other | Attending: Medical

## 2021-09-22 ENCOUNTER — Encounter: Payer: Medicaid Other | Admitting: Obstetrics & Gynecology

## 2021-09-22 ENCOUNTER — Ambulatory Visit: Payer: Medicaid Other | Admitting: *Deleted

## 2021-09-22 ENCOUNTER — Encounter: Payer: Self-pay | Admitting: *Deleted

## 2021-09-22 ENCOUNTER — Other Ambulatory Visit: Payer: Self-pay | Admitting: *Deleted

## 2021-09-22 VITALS — BP 134/83 | HR 86

## 2021-09-22 DIAGNOSIS — O0991 Supervision of high risk pregnancy, unspecified, first trimester: Secondary | ICD-10-CM | POA: Insufficient documentation

## 2021-09-22 DIAGNOSIS — O99011 Anemia complicating pregnancy, first trimester: Secondary | ICD-10-CM | POA: Insufficient documentation

## 2021-09-22 DIAGNOSIS — Z3491 Encounter for supervision of normal pregnancy, unspecified, first trimester: Secondary | ICD-10-CM | POA: Diagnosis not present

## 2021-09-22 DIAGNOSIS — Z363 Encounter for antenatal screening for malformations: Secondary | ICD-10-CM | POA: Diagnosis not present

## 2021-09-22 DIAGNOSIS — O09292 Supervision of pregnancy with other poor reproductive or obstetric history, second trimester: Secondary | ICD-10-CM | POA: Diagnosis not present

## 2021-09-22 DIAGNOSIS — Z3A18 18 weeks gestation of pregnancy: Secondary | ICD-10-CM | POA: Diagnosis not present

## 2021-09-22 DIAGNOSIS — Z8759 Personal history of other complications of pregnancy, childbirth and the puerperium: Secondary | ICD-10-CM | POA: Insufficient documentation

## 2021-09-22 NOTE — Progress Notes (Signed)
7893: Attempted to call patient to connect via video visit. No answer, unable to leave voicemail. Resent direct link. Will retry.  8101: Attempted to call patient to connect, no answer. Will notify front desk to reschedule   Wynona Canes, CMA

## 2021-10-19 NOTE — L&D Delivery Note (Signed)
OB/GYN Faculty Practice Delivery Note ? ?Deziya Amero is a 31 y.o. [id number] s/p SVD at [redacted]w[redacted]d. She was admitted for early labor and gHTN.  ? ?ROM: 1h 49m with clear fluid ?GBS Status: Negative  ? ?Delivery Date/Time: 02/18/22 at 0726 ? ?Delivery: Called to room and patient was complete and pushing. Head delivered LOA. No nuchal cord present. Shoulder and body delivered in usual fashion. Infant with spontaneous cry, placed on mother's abdomen, dried and stimulated. Cord clamped x 2 after 1-minute delay and cut by mother under direct supervision. Cord blood drawn. Placenta delivered spontaneously with gentle cord traction. Fundus firm with massage and Pitocin. Labia, perineum, vagina, and cervix were inspected, and no lacerations were noted.  ? ?Placenta: Intact, 3VC - sent to L&D ?Complications: None  ?Lacerations: None  ?EBL: 100 cc ?Analgesia: Epidural  ? ?Infant: Viable female  APGARs 9 and 9   ? ?Evalina Field, MD ?OB/GYN Fellow, Faculty Practice  ? ? ?

## 2021-10-21 ENCOUNTER — Other Ambulatory Visit: Payer: Self-pay

## 2021-10-21 ENCOUNTER — Ambulatory Visit: Payer: Medicaid Other | Attending: Obstetrics and Gynecology

## 2021-10-21 ENCOUNTER — Other Ambulatory Visit: Payer: Self-pay | Admitting: *Deleted

## 2021-10-21 ENCOUNTER — Telehealth: Payer: Medicaid Other | Admitting: Student

## 2021-10-21 ENCOUNTER — Ambulatory Visit: Payer: Medicaid Other | Admitting: *Deleted

## 2021-10-21 VITALS — BP 130/79 | HR 92

## 2021-10-21 DIAGNOSIS — Z3A22 22 weeks gestation of pregnancy: Secondary | ICD-10-CM | POA: Diagnosis not present

## 2021-10-21 DIAGNOSIS — O09292 Supervision of pregnancy with other poor reproductive or obstetric history, second trimester: Secondary | ICD-10-CM | POA: Diagnosis present

## 2021-10-21 DIAGNOSIS — O99011 Anemia complicating pregnancy, first trimester: Secondary | ICD-10-CM

## 2021-10-21 DIAGNOSIS — O0991 Supervision of high risk pregnancy, unspecified, first trimester: Secondary | ICD-10-CM | POA: Diagnosis present

## 2021-10-21 DIAGNOSIS — Z8759 Personal history of other complications of pregnancy, childbirth and the puerperium: Secondary | ICD-10-CM

## 2021-10-21 DIAGNOSIS — O365921 Maternal care for other known or suspected poor fetal growth, second trimester, fetus 1: Secondary | ICD-10-CM

## 2021-10-28 ENCOUNTER — Ambulatory Visit: Payer: Medicaid Other | Admitting: Family Medicine

## 2021-10-28 NOTE — Progress Notes (Signed)
Opened in error. No show for appt

## 2021-11-13 ENCOUNTER — Ambulatory Visit (INDEPENDENT_AMBULATORY_CARE_PROVIDER_SITE_OTHER): Payer: Medicaid Other | Admitting: Family Medicine

## 2021-11-13 ENCOUNTER — Encounter: Payer: Self-pay | Admitting: Family Medicine

## 2021-11-13 ENCOUNTER — Other Ambulatory Visit: Payer: Self-pay

## 2021-11-13 VITALS — BP 133/84 | HR 97

## 2021-11-13 DIAGNOSIS — Z8759 Personal history of other complications of pregnancy, childbirth and the puerperium: Secondary | ICD-10-CM

## 2021-11-13 DIAGNOSIS — Z3A26 26 weeks gestation of pregnancy: Secondary | ICD-10-CM

## 2021-11-13 DIAGNOSIS — Z23 Encounter for immunization: Secondary | ICD-10-CM | POA: Diagnosis not present

## 2021-11-13 DIAGNOSIS — O0991 Supervision of high risk pregnancy, unspecified, first trimester: Secondary | ICD-10-CM

## 2021-11-13 NOTE — Progress Notes (Signed)
° °  PRENATAL VISIT NOTE  Subjective:  Courtney [name] is a 31 y.o. [id number] at [redacted]w[redacted]d being seen today for ongoing prenatal care.  She is currently monitored for the following issues for this low-risk pregnancy and has Anxiety; Migraine hx; History of prior pregnancy with IUGR newborn; Supervision of high risk pregnancy, antepartum, first trimester; History of twin pregnancy in prior pregnancy; and Anemia in pregnancy, first trimester on their problem list.  Patient reports no complaints.  Contractions: Irritability. Vag. Bleeding: None.  Movement: Present. Denies leaking of fluid.   The following portions of the patient's history were reviewed and updated as appropriate: allergies, current medications, past family history, past medical history, past social history, past surgical history and problem list.   Objective:   Vitals:   11/13/21 1447  BP: 133/84  Pulse: 97    Fetal Status: Fetal Heart Rate (bpm): 153 Fundal Height: 27 cm Movement: Present     General:  Alert, oriented and cooperative. Patient is in no acute distress.  Skin: Skin is warm and dry. No rash noted.   Cardiovascular: Normal heart rate noted  Respiratory: Normal respiratory effort, no problems with respiration noted  Abdomen: Soft, gravid, appropriate for gestational age.  Pain/Pressure: Present     Pelvic: Cervical exam deferred        Extremities: Normal range of motion.  Edema: None  Mental Status: Normal mood and affect. Normal behavior. Normal judgment and thought content.   Assessment and Plan:  Pregnancy: [id number] at [redacted]w[redacted]d 1. Supervision of high risk pregnancy, antepartum, first trimester 28 wk labs next visit - Tdap vaccine greater than or equal to 7yo IM  2. History of prior pregnancy with IUGR newborn Has u/s for growth, all babies are small  Preterm labor symptoms and general obstetric precautions including but not limited to vaginal bleeding, contractions, leaking of fluid and fetal movement were  reviewed in detail with the patient. Please refer to After Visit Summary for other counseling recommendations.   Return in 2 weeks (on 11/27/2021) for [facility], 28 wk labs.  Future Appointments  Date Time Provider Department Center  11/18/2021  3:30 PM [facility]URSE [facility]  11/18/2021  3:45 PM [facility] US6 [facility]  12/01/2021  8:20 AM [facility] LAB [facility]  12/01/2021  9:55 AM [place] Bing, MD [facility]    Reva Bores, MD

## 2021-11-14 NOTE — Progress Notes (Signed)
Chart reviewed for nurse visit. Agree with plan of care.  ° °Danielle Lento N, PA-C °11/14/2021 11:34 AM  ° °

## 2021-11-18 ENCOUNTER — Ambulatory Visit: Payer: Medicaid Other | Attending: Obstetrics

## 2021-11-18 ENCOUNTER — Ambulatory Visit: Payer: Medicaid Other | Admitting: *Deleted

## 2021-11-18 ENCOUNTER — Other Ambulatory Visit: Payer: Self-pay

## 2021-11-18 VITALS — BP 125/85 | HR 97

## 2021-11-18 DIAGNOSIS — O365921 Maternal care for other known or suspected poor fetal growth, second trimester, fetus 1: Secondary | ICD-10-CM | POA: Insufficient documentation

## 2021-11-18 DIAGNOSIS — O99011 Anemia complicating pregnancy, first trimester: Secondary | ICD-10-CM

## 2021-11-18 DIAGNOSIS — Z3A26 26 weeks gestation of pregnancy: Secondary | ICD-10-CM

## 2021-11-18 DIAGNOSIS — O365920 Maternal care for other known or suspected poor fetal growth, second trimester, not applicable or unspecified: Secondary | ICD-10-CM

## 2021-11-18 DIAGNOSIS — Z8759 Personal history of other complications of pregnancy, childbirth and the puerperium: Secondary | ICD-10-CM | POA: Insufficient documentation

## 2021-11-18 DIAGNOSIS — O0991 Supervision of high risk pregnancy, unspecified, first trimester: Secondary | ICD-10-CM | POA: Insufficient documentation

## 2021-11-19 ENCOUNTER — Other Ambulatory Visit: Payer: Self-pay | Admitting: *Deleted

## 2021-11-19 DIAGNOSIS — Z8759 Personal history of other complications of pregnancy, childbirth and the puerperium: Secondary | ICD-10-CM

## 2021-12-01 ENCOUNTER — Other Ambulatory Visit: Payer: Self-pay

## 2021-12-01 ENCOUNTER — Other Ambulatory Visit: Payer: Medicaid Other

## 2021-12-01 ENCOUNTER — Encounter: Payer: Self-pay | Admitting: *Deleted

## 2021-12-01 ENCOUNTER — Ambulatory Visit (INDEPENDENT_AMBULATORY_CARE_PROVIDER_SITE_OTHER): Payer: Medicaid Other | Admitting: Obstetrics and Gynecology

## 2021-12-01 VITALS — BP 136/89 | HR 112 | Wt 150.9 lb

## 2021-12-01 DIAGNOSIS — Z3A28 28 weeks gestation of pregnancy: Secondary | ICD-10-CM

## 2021-12-01 DIAGNOSIS — Z8759 Personal history of other complications of pregnancy, childbirth and the puerperium: Secondary | ICD-10-CM

## 2021-12-01 DIAGNOSIS — O0991 Supervision of high risk pregnancy, unspecified, first trimester: Secondary | ICD-10-CM

## 2021-12-01 NOTE — Progress Notes (Signed)
° ° °  PRENATAL VISIT NOTE  Subjective:  Courtney [name] is a 31 y.o. [id number] at [redacted]w[redacted]d being seen today for ongoing prenatal care.  She is currently monitored for the following issues for this high-risk pregnancy and has Anxiety; Migraine hx; History of prior pregnancy with IUGR newborn; Supervision of high risk pregnancy, antepartum, first trimester; and Anemia in pregnancy, first trimester on their problem list.  Patient reports no complaints.  Contractions: Not present. Vag. Bleeding: None.  Movement: Present. Denies leaking of fluid.   The following portions of the patient's history were reviewed and updated as appropriate: allergies, current medications, past family history, past medical history, past social history, past surgical history and problem list.   Objective:   Vitals:   12/01/21 0903  BP: 136/89  Pulse: (!) 112  Weight: 150 lb 14.4 oz (68.4 kg)    Fetal Status: Fetal Heart Rate (bpm): 142   Movement: Present     General:  Alert, oriented and cooperative. Patient is in no acute distress.  Skin: Skin is warm and dry. No rash noted.   Cardiovascular: Normal heart rate noted  Respiratory: Normal respiratory effort, no problems with respiration noted  Abdomen: Soft, gravid, appropriate for gestational age.  Pain/Pressure: Present     Pelvic: Cervical exam deferred        Extremities: Normal range of motion.  Edema: None  Mental Status: Normal mood and affect. Normal behavior. Normal judgment and thought content.   Assessment and Plan:  Pregnancy: [id number] at [redacted]w[redacted]d 1. [redacted] weeks gestation of pregnancy 28wk labs today Undecided on [facility]. All options d/w her. Pt leaning towards nexplanon  2. History of prior pregnancy with IUGR newborn Continue surveillance growth u/s. Next on 2/27 1/31: 27%, 954gm, ac 23%, normal afi  Preterm labor symptoms and general obstetric precautions including but not limited to vaginal bleeding, contractions, leaking of fluid and fetal movement were  reviewed in detail with the patient. Please refer to After Visit Summary for other counseling recommendations.   Return in 2 weeks (on 12/15/2021) for low risk ob, in person, md or app.  Future Appointments  Date Time Provider Department Center  12/01/2021  9:55 AM [place] Bing, MD [facility]  12/15/2021  2:15 PM Currie Paris, NP [facility]  12/15/2021  3:00 PM [facility]SE [facility]  12/15/2021  3:15 PM [facility] US2 [facility]    Kane Bing, MD

## 2021-12-02 LAB — GLUCOSE TOLERANCE, 2 HOURS W/ 1HR
Glucose, 1 hour: 149 mg/dL (ref 70–179)
Glucose, 2 hour: 103 mg/dL (ref 70–152)
Glucose, Fasting: 82 mg/dL (ref 70–91)

## 2021-12-02 LAB — HIV ANTIBODY (ROUTINE TESTING W REFLEX): HIV Screen 4th Generation wRfx: NONREACTIVE

## 2021-12-02 LAB — CBC
Hematocrit: 33.6 % — ABNORMAL LOW (ref 34.0–46.6)
Hemoglobin: 11 g/dL — ABNORMAL LOW (ref 11.1–15.9)
MCH: 26.4 pg — ABNORMAL LOW (ref 26.6–33.0)
MCHC: 32.7 g/dL (ref 31.5–35.7)
MCV: 81 fL (ref 79–97)
Platelets: 253 10*3/uL (ref 150–450)
RBC: 4.17 x10E6/uL (ref 3.77–5.28)
RDW: 16.7 % — ABNORMAL HIGH (ref 11.7–15.4)
WBC: 11.8 10*3/uL — ABNORMAL HIGH (ref 3.4–10.8)

## 2021-12-02 LAB — RPR: RPR Ser Ql: NONREACTIVE

## 2021-12-15 ENCOUNTER — Other Ambulatory Visit: Payer: Self-pay | Admitting: Obstetrics

## 2021-12-15 ENCOUNTER — Ambulatory Visit: Payer: Medicaid Other | Attending: Obstetrics

## 2021-12-15 ENCOUNTER — Ambulatory Visit (INDEPENDENT_AMBULATORY_CARE_PROVIDER_SITE_OTHER): Payer: Medicaid Other | Admitting: Nurse Practitioner

## 2021-12-15 ENCOUNTER — Other Ambulatory Visit: Payer: Self-pay | Admitting: *Deleted

## 2021-12-15 ENCOUNTER — Other Ambulatory Visit: Payer: Self-pay

## 2021-12-15 ENCOUNTER — Ambulatory Visit: Payer: Medicaid Other | Attending: Obstetrics | Admitting: Obstetrics

## 2021-12-15 ENCOUNTER — Encounter: Payer: Self-pay | Admitting: *Deleted

## 2021-12-15 ENCOUNTER — Ambulatory Visit: Payer: Medicaid Other | Admitting: *Deleted

## 2021-12-15 VITALS — BP 129/80 | HR 87

## 2021-12-15 VITALS — BP 125/83 | HR 90 | Wt 153.3 lb

## 2021-12-15 DIAGNOSIS — Z8759 Personal history of other complications of pregnancy, childbirth and the puerperium: Secondary | ICD-10-CM

## 2021-12-15 DIAGNOSIS — O99011 Anemia complicating pregnancy, first trimester: Secondary | ICD-10-CM

## 2021-12-15 DIAGNOSIS — O365990 Maternal care for other known or suspected poor fetal growth, unspecified trimester, not applicable or unspecified: Secondary | ICD-10-CM

## 2021-12-15 DIAGNOSIS — O09293 Supervision of pregnancy with other poor reproductive or obstetric history, third trimester: Secondary | ICD-10-CM

## 2021-12-15 DIAGNOSIS — O0991 Supervision of high risk pregnancy, unspecified, first trimester: Secondary | ICD-10-CM | POA: Insufficient documentation

## 2021-12-15 DIAGNOSIS — Z3A30 30 weeks gestation of pregnancy: Secondary | ICD-10-CM

## 2021-12-15 DIAGNOSIS — O365930 Maternal care for other known or suspected poor fetal growth, third trimester, not applicable or unspecified: Secondary | ICD-10-CM

## 2021-12-15 NOTE — Progress Notes (Signed)
° ° °  Subjective:  Courtney [name] is a 31 y.o. [id number] at [redacted]w[redacted]d being seen today for ongoing prenatal care.  She is currently monitored for the following issues for this high-risk pregnancy and has Anxiety; Migraine hx; History of prior pregnancy with IUGR newborn; Supervision of high risk pregnancy, antepartum, first trimester; and Anemia in pregnancy, first trimester on their problem list.  Patient reports no complaints.  Contractions: Not present. Vag. Bleeding: None.  Movement: Present. Denies leaking of fluid.   The following portions of the patient's history were reviewed and updated as appropriate: allergies, current medications, past family history, past medical history, past social history, past surgical history and problem list. Problem list updated.  Objective:   Vitals:   12/15/21 1430  BP: 125/83  Pulse: 90  Weight: 153 lb 4.8 oz (69.5 kg)    Fetal Status: Fetal Heart Rate (bpm): 145 Fundal Height: 34 cm Movement: Present     General:  Alert, oriented and cooperative. Patient is in no acute distress.  Skin: Skin is warm and dry. No rash noted.   Cardiovascular: Normal heart rate noted  Respiratory: Normal respiratory effort, no problems with respiration noted  Abdomen: Soft, gravid, appropriate for gestational age. Pain/Pressure: Present     Pelvic:  Cervical exam deferred        Extremities: Normal range of motion.  Edema: None  Mental Status: Normal mood and affect. Normal behavior. Normal judgment and thought content.   Urinalysis:      Assessment and Plan:  Pregnancy: [id number] at [redacted]w[redacted]d  1. Supervision of high risk pregnancy, antepartum, first trimester Doing well - baby is moving well No problems with Migraines or anxiety Considering IUD as contraception also - info given in AVS - recommend Liletta IUD  2. History of prior pregnancy with IUGR newborn Has [place] today  3. [redacted] weeks gestation of pregnancy   Preterm labor symptoms and general obstetric precautions  including but not limited to vaginal bleeding, contractions, leaking of fluid and fetal movement were reviewed in detail with the patient. Please refer to After Visit Summary for other counseling recommendations.  Return in about 2 weeks (around 12/29/2021) for ROB in person.  Earlie Server, RN, MSN, NP-BC Nurse Practitioner, [facility] for [employer] [employer] 12/15/2021 3:04 PM

## 2021-12-16 NOTE — Progress Notes (Signed)
MFM Note  Genee Fialkowski was seen for a follow up growth scan as her prior pregnancies were complicated by fetal growth restriction.  She denies any problems since her last exam.  She was informed that the fetal growth measures at the 11th percentile for her gestational age.  However, the fetal abdominal circumference measures at the 6 percentile, indicating fetal growth restriction.  There was normal amniotic fluid noted today.  Fetal movements were noted throughout today's exam.  Doppler studies of the umbilical arteries performed today continues to show normal forward flow with a normal S/D ratio of 3.3.  There were no signs of absent or reversed end-diastolic flow.    Due to IUGR, we will continue to follow her with weekly umbilical artery Doppler studies and BPP's.    Should IUGR continue to be noted later in her pregnancy and her fetal testing remains within normal limits, delivery will be recommended at around 37 weeks.  Another BPP and umbilical artery Doppler study was scheduled in 1 week.  The patient stated that all of her questions have been answered.  A total of 20 minutes was spent counseling and coordinating the care for this patient.  Greater than 50% of the time was spent in direct face-to-face contact.

## 2021-12-22 ENCOUNTER — Encounter: Payer: Self-pay | Admitting: *Deleted

## 2021-12-22 ENCOUNTER — Ambulatory Visit: Payer: Medicaid Other | Admitting: *Deleted

## 2021-12-22 ENCOUNTER — Ambulatory Visit: Payer: Medicaid Other | Attending: Obstetrics

## 2021-12-22 ENCOUNTER — Other Ambulatory Visit: Payer: Self-pay

## 2021-12-22 VITALS — BP 132/79 | HR 96

## 2021-12-22 DIAGNOSIS — O365930 Maternal care for other known or suspected poor fetal growth, third trimester, not applicable or unspecified: Secondary | ICD-10-CM

## 2021-12-22 DIAGNOSIS — Z8759 Personal history of other complications of pregnancy, childbirth and the puerperium: Secondary | ICD-10-CM

## 2021-12-22 DIAGNOSIS — O0991 Supervision of high risk pregnancy, unspecified, first trimester: Secondary | ICD-10-CM

## 2021-12-22 DIAGNOSIS — O99011 Anemia complicating pregnancy, first trimester: Secondary | ICD-10-CM

## 2021-12-22 DIAGNOSIS — O365990 Maternal care for other known or suspected poor fetal growth, unspecified trimester, not applicable or unspecified: Secondary | ICD-10-CM | POA: Diagnosis not present

## 2021-12-22 DIAGNOSIS — O09293 Supervision of pregnancy with other poor reproductive or obstetric history, third trimester: Secondary | ICD-10-CM | POA: Diagnosis not present

## 2021-12-22 DIAGNOSIS — Z3A31 31 weeks gestation of pregnancy: Secondary | ICD-10-CM

## 2021-12-30 ENCOUNTER — Ambulatory Visit: Payer: Medicaid Other | Admitting: *Deleted

## 2021-12-30 ENCOUNTER — Ambulatory Visit: Payer: Medicaid Other | Attending: Obstetrics

## 2021-12-30 ENCOUNTER — Other Ambulatory Visit: Payer: Self-pay

## 2021-12-30 VITALS — BP 129/89 | HR 99

## 2021-12-30 DIAGNOSIS — O09299 Supervision of pregnancy with other poor reproductive or obstetric history, unspecified trimester: Secondary | ICD-10-CM | POA: Diagnosis not present

## 2021-12-30 DIAGNOSIS — O99011 Anemia complicating pregnancy, first trimester: Secondary | ICD-10-CM | POA: Insufficient documentation

## 2021-12-30 DIAGNOSIS — Z8759 Personal history of other complications of pregnancy, childbirth and the puerperium: Secondary | ICD-10-CM | POA: Diagnosis present

## 2021-12-30 DIAGNOSIS — O0991 Supervision of high risk pregnancy, unspecified, first trimester: Secondary | ICD-10-CM | POA: Insufficient documentation

## 2021-12-30 DIAGNOSIS — Z3A32 32 weeks gestation of pregnancy: Secondary | ICD-10-CM | POA: Diagnosis not present

## 2021-12-30 DIAGNOSIS — O365990 Maternal care for other known or suspected poor fetal growth, unspecified trimester, not applicable or unspecified: Secondary | ICD-10-CM | POA: Diagnosis present

## 2021-12-30 DIAGNOSIS — O365930 Maternal care for other known or suspected poor fetal growth, third trimester, not applicable or unspecified: Secondary | ICD-10-CM | POA: Diagnosis not present

## 2022-01-01 ENCOUNTER — Other Ambulatory Visit: Payer: Self-pay

## 2022-01-01 ENCOUNTER — Telehealth (INDEPENDENT_AMBULATORY_CARE_PROVIDER_SITE_OTHER): Payer: Medicaid Other | Admitting: Certified Nurse Midwife

## 2022-01-01 DIAGNOSIS — O99011 Anemia complicating pregnancy, first trimester: Secondary | ICD-10-CM

## 2022-01-01 DIAGNOSIS — O365990 Maternal care for other known or suspected poor fetal growth, unspecified trimester, not applicable or unspecified: Secondary | ICD-10-CM

## 2022-01-01 DIAGNOSIS — Z8759 Personal history of other complications of pregnancy, childbirth and the puerperium: Secondary | ICD-10-CM

## 2022-01-01 DIAGNOSIS — O0991 Supervision of high risk pregnancy, unspecified, first trimester: Secondary | ICD-10-CM

## 2022-01-01 DIAGNOSIS — Z3A33 33 weeks gestation of pregnancy: Secondary | ICD-10-CM

## 2022-01-01 NOTE — Progress Notes (Signed)
? ?OBSTETRICS PRENATAL VIRTUAL VISIT ENCOUNTER NOTE ? ?Provider location: [facility]  ? ?Patient location: Home ? ?I connected with Courtney [name] on 01/01/22 at  8:55 AM EDT by MyChart Video Encounter and verified that I am speaking with the correct person using two identifiers. I discussed the limitations, risks, security and privacy concerns of performing an evaluation and management service virtually and the availability of in person appointments. I also discussed with the patient that there may be a patient responsible charge related to this service. The patient expressed understanding and agreed to proceed. ?Subjective:  ?Courtney [name] is a 31 y.o. [id number] at [redacted]w[redacted]d being seen today for ongoing prenatal care.  She is currently monitored for the following issues for this high-risk pregnancy and has Anxiety; Migraine hx; History of prior pregnancy with IUGR newborn; Supervision of high risk pregnancy, antepartum, first trimester; and Anemia in pregnancy, first trimester on their problem list. ? ?Patient reports no complaints, wonders if she can go farther without induction at the end of this pregnancy if everything is ok with the baby. Contractions: Irritability. Vag. Bleeding: None.  Movement: Present. Denies any leaking of fluid.  ? ?The following portions of the patient's history were reviewed and updated as appropriate: allergies, current medications, past family history, past medical history, past social history, past surgical history and problem list.  ? ?Objective:  ?There were no vitals filed for this visit. ? ?Fetal Status:     Movement: Present    ? ?General:  Alert, oriented and cooperative. Patient is in no acute distress.  ?Respiratory: Normal respiratory effort, no problems with respiration noted  ?Mental Status: Normal mood and affect. Normal behavior. Normal judgment and thought content.  ?Rest of physical exam deferred due to type of  encounter ? ?Imaging: ?[place] MFM FETAL BPP WO NON STRESS ? ?Result Date: 12/30/2021 ?----------------------------------------------------------------------  OBSTETRICS REPORT                       (Signed Final 12/30/2021 02:52 pm) ---------------------------------------------------------------------- Patient Info  ID #:       [id number]                          D.O.B.:  [birth year] (30 yrs)  Name:       Courtney [name]                 Visit Date: 12/30/2021 02:23 pm ---------------------------------------------------------------------- Performed By  Attending:        Sander Nephew      Ref. Address:     [address]                    MD                                                             [place], [place]  Performed  By:     Cyndie Mull          [facility]                                     [facility]  Referred By:      [facility] ---------------------------------------------------------------------- Orders  #  Description                           Code        Ordered By  1  [place] MFM UA CORD DOPPLER                76820.02    YU FANG  2  [place] MFM FETAL BPP WO NON               [id number]    YU FANG     STRESS ----------------------------------------------------------------------  #  Order #                     Accession #                Episode #  1  [id number]                   [id number]                 [id number]  2  [id number]                   [id number]                 [id number] ---------------------------------------------------------------------- Indications  Maternal care for known or suspected poor      O36.5930  fetal growth, third trimester, not applicable or  unspecified IUGR  Poor obstetric history: Previous fetal  growth  O09.299  restriction (FGR)  [redacted] weeks gestation of pregnancy                Z3A.32  Low risk NIPS ---------------------------------------------------------------------- Fetal Evaluation  Num Of Fetuses:         1  Fetal Heart Rate(bpm):  137  Cardiac Activity:       Observed  Presentation:           Cephalic  Placenta:               Anterior  P. Cord Insertion:      Visualized, central  Amniotic Fluid  AFI FV:      Within normal limits  AFI Sum(cm)     %Tile       Largest Pocket(cm)  14.23           49          6.32  RUQ(cm)       RLQ(cm)       LUQ(cm)        LLQ(cm)  6.32          1.98          2.59           3.34 ---------------------------------------------------------------------- Biophysical Evaluation  Amniotic F.V:   Pocket => 2 cm             F. Tone:        Observed  F. Movement:    Observed                   Score:          8/8  F. Breathing:   Observed ---------------------------------------------------------------------- OB History  Gravidity:    5         Term:   3        Prem:   0        SAB:   1  TOP:          0       Ectopic:  0        Living: 4 ---------------------------------------------------------------------- Gestational Age  LMP:           35w 3d        Date:  04/26/21                 EDD:   01/31/22  Best:          Milderd Meager 6d     Det. By:  Loman Chroman         EDD:   02/18/22                                      (06/30/21) ---------------------------------------------------------------------- Anatomy  Stomach:               Appears normal, left   Bladder:                Appears normal                         sided  Kidneys:               Appear normal ---------------------------------------------------------------------- Doppler - Fetal Vessels  Umbilical Artery   S/D     %tile      RI    %tile      PI    %tile     PSV    ADFV    RDFV                                                     (cm/s)   3.01       73    0.67       78    0.84  40    49.57      No      No  ---------------------------------------------------------------------- Impression  Antenatal testing due to IUGR with an EFW 11th% with an  AC < 6th%.  Biophysical profile 8/8 with good fetal movement and  amniotic fluid volume  UA Dopplers are normal with no evidence of AEDF or REDF ---------------------------------------------------------------------- Recommendations  Follow up growth previously scheduled in 1 week.  Continue weekly testing with UA Dopplers. ----------------------------------------------------------------------               Sander Nephew, MD Electronically Signed Final Report   12/30/2021 02:52 pm ---------------------------------------------------------------------- ? ?[place] MFM FETAL BPP WO NON STRESS ? ?Result Date: 12/22/2021 ?----------------------------------------------------------------------  OBSTETRICS REPORT                       (Signed Final 12/22/2021 02:36 pm) ---------------------------------------------------------------------- Patient Info  ID #:       [id number]                          D.O.B.:  06-16-1991 (30 yrs)  Name:       Courtney [name]                 Visit Date: 12/22/2021 12:06 pm ---------------------------------------------------------------------- Performed By  Attending:        Tama High MD        Ref. Address:     [address]                                                             [place], [place]  Performed By:     Rodrigo Ran BS      Location[facility]         [facility] RVT                                 Fetal Care at                                                             [facility]                                                             [facility]  Referred By:      [facility] ---------------------------------------------------------------------- Orders  #  Description                           Code        Ordered By  1  [place]  MFM UA CORD DOPPLER                76820.02    YU FANG  2  [place] MFM FETAL BPP WO NON               [id number]    YU FANG     STRESS ----------------------------------------------------------------------  #  Order #                     Access

## 2022-01-05 ENCOUNTER — Other Ambulatory Visit: Payer: Self-pay | Admitting: *Deleted

## 2022-01-05 ENCOUNTER — Ambulatory Visit: Payer: Medicaid Other | Attending: Obstetrics

## 2022-01-05 ENCOUNTER — Encounter: Payer: Self-pay | Admitting: *Deleted

## 2022-01-05 ENCOUNTER — Ambulatory Visit: Payer: Medicaid Other | Admitting: *Deleted

## 2022-01-05 ENCOUNTER — Other Ambulatory Visit: Payer: Self-pay

## 2022-01-05 VITALS — BP 136/81 | HR 96

## 2022-01-05 DIAGNOSIS — Z8759 Personal history of other complications of pregnancy, childbirth and the puerperium: Secondary | ICD-10-CM | POA: Diagnosis present

## 2022-01-05 DIAGNOSIS — O365990 Maternal care for other known or suspected poor fetal growth, unspecified trimester, not applicable or unspecified: Secondary | ICD-10-CM | POA: Diagnosis present

## 2022-01-05 DIAGNOSIS — O0991 Supervision of high risk pregnancy, unspecified, first trimester: Secondary | ICD-10-CM | POA: Insufficient documentation

## 2022-01-05 DIAGNOSIS — O99011 Anemia complicating pregnancy, first trimester: Secondary | ICD-10-CM | POA: Insufficient documentation

## 2022-01-05 DIAGNOSIS — O365930 Maternal care for other known or suspected poor fetal growth, third trimester, not applicable or unspecified: Secondary | ICD-10-CM | POA: Diagnosis not present

## 2022-01-05 DIAGNOSIS — Z3A33 33 weeks gestation of pregnancy: Secondary | ICD-10-CM

## 2022-01-05 DIAGNOSIS — Z3689 Encounter for other specified antenatal screening: Secondary | ICD-10-CM

## 2022-01-05 DIAGNOSIS — O09293 Supervision of pregnancy with other poor reproductive or obstetric history, third trimester: Secondary | ICD-10-CM

## 2022-01-12 ENCOUNTER — Ambulatory Visit (INDEPENDENT_AMBULATORY_CARE_PROVIDER_SITE_OTHER): Payer: Medicaid Other | Admitting: Obstetrics and Gynecology

## 2022-01-12 ENCOUNTER — Other Ambulatory Visit: Payer: Self-pay

## 2022-01-12 ENCOUNTER — Ambulatory Visit: Payer: Medicaid Other

## 2022-01-12 ENCOUNTER — Other Ambulatory Visit: Payer: Medicaid Other

## 2022-01-12 VITALS — BP 119/80 | HR 93 | Wt 152.5 lb

## 2022-01-12 DIAGNOSIS — O2243 Hemorrhoids in pregnancy, third trimester: Secondary | ICD-10-CM | POA: Insufficient documentation

## 2022-01-12 DIAGNOSIS — Z3A34 34 weeks gestation of pregnancy: Secondary | ICD-10-CM

## 2022-01-12 DIAGNOSIS — Z8759 Personal history of other complications of pregnancy, childbirth and the puerperium: Secondary | ICD-10-CM

## 2022-01-12 DIAGNOSIS — O0993 Supervision of high risk pregnancy, unspecified, third trimester: Secondary | ICD-10-CM

## 2022-01-12 MED ORDER — DOCUSATE SODIUM 100 MG PO CAPS: 100.0000 mg | ORAL_CAPSULE | Freq: Two times a day (BID) | ORAL | 2 refills | Status: DC | PRN

## 2022-01-12 MED ORDER — PROCTOFOAM HC 1-1 % EX FOAM: 1.0000 | Freq: Two times a day (BID) | CUTANEOUS | 2 refills | Status: DC

## 2022-01-12 NOTE — Progress Notes (Signed)
? ?  PRENATAL VISIT NOTE ? ?Subjective:  ?Courtney [name] is a 31 y.o. [id number] at [redacted]w[redacted]d being seen today for ongoing prenatal care.  She is currently monitored for the following issues for this high-risk pregnancy and has Anxiety; Migraine hx; History of prior pregnancy with IUGR newborn; Pregnancy, supervision, high-risk, third trimester; Anemia in pregnancy, first trimester; Pregnancy affected by fetal growth restriction; and Hemorrhoids during pregnancy in third trimester on their problem list. ? ?Patient doing well with no acute concerns today. She reports  pt c/o hemorrhoids .  Contractions: Irritability. Vag. Bleeding: None.  Movement: Present. Denies leaking of fluid.  ? ?The following portions of the patient's history were reviewed and updated as appropriate: allergies, current medications, past family history, past medical history, past social history, past surgical history and problem list. Problem list updated. ? ?Objective:  ? ?Vitals:  ? 01/12/22 1401  ?BP: 119/80  ?Pulse: 93  ?Weight: 152 lb 8 oz (69.2 kg)  ? ? ?Fetal Status: Fetal Heart Rate (bpm): 150 Fundal Height: 34 cm Movement: Present    ? ?General:  Alert, oriented and cooperative. Patient is in no acute distress.  ?Skin: Skin is warm and dry. No rash noted.   ?Cardiovascular: Normal heart rate noted  ?Respiratory: Normal respiratory effort, no problems with respiration noted  ?Abdomen: Soft, gravid, appropriate for gestational age.  Pain/Pressure: Present     ?Pelvic: Cervical exam deferred        ?Extremities: Normal range of motion.  Edema: Trace  ?Mental Status:  Normal mood and affect. Normal behavior. Normal judgment and thought content.  ? ?Assessment and Plan:  ?Pregnancy: [id number] at [redacted]w[redacted]d ? ?1. [redacted] weeks gestation of pregnancy ? ? ?2. History of prior pregnancy with IUGR newborn ?Current EFW at 38%, pt has f/u growth scan in April ? ?3. Pregnancy, supervision, high-risk, third trimester ?Continue routine care, vaginal swabs next  visit ? ?4. Hemorrhoids during pregnancy in third trimester ? ?- hydrocortisone-pramoxine (PROCTOFOAM HC) rectal foam; Place 1 applicator rectally 2 (two) times daily.  Dispense: 10 g; Refill: 2 ? ?Preterm labor symptoms and general obstetric precautions including but not limited to vaginal bleeding, contractions, leaking of fluid and fetal movement were reviewed in detail with the patient. ? ?Please refer to After Visit Summary for other counseling recommendations.  ? ?Return in about 2 weeks (around 01/26/2022) for [facility], in person, 36 weeks swabs. ? ? ?Lynnda Shields, MD ?Faculty Attending ?[facility] [place] ?  ?

## 2022-01-26 ENCOUNTER — Encounter: Payer: Self-pay | Admitting: Obstetrics and Gynecology

## 2022-01-26 ENCOUNTER — Other Ambulatory Visit (HOSPITAL_COMMUNITY)
Admission: RE | Admit: 2022-01-26 | Discharge: 2022-01-26 | Disposition: A | Discharge status: Home / Self Care | Payer: Medicaid Other | Source: Ambulatory Visit | Attending: Obstetrics and Gynecology | Admitting: Obstetrics and Gynecology

## 2022-01-26 ENCOUNTER — Ambulatory Visit (INDEPENDENT_AMBULATORY_CARE_PROVIDER_SITE_OTHER): Payer: Medicaid Other | Admitting: Obstetrics and Gynecology

## 2022-01-26 VITALS — BP 128/84 | HR 99 | Wt 152.7 lb

## 2022-01-26 DIAGNOSIS — O365990 Maternal care for other known or suspected poor fetal growth, unspecified trimester, not applicable or unspecified: Secondary | ICD-10-CM

## 2022-01-26 DIAGNOSIS — O0993 Supervision of high risk pregnancy, unspecified, third trimester: Secondary | ICD-10-CM | POA: Diagnosis present

## 2022-01-26 NOTE — Progress Notes (Signed)
Subjective:  ?Courtney [name] is a 31 y.o. [id number] at [redacted]w[redacted]d being seen today for ongoing prenatal care.  She is currently monitored for the following issues for this low-risk pregnancy and has Anxiety; Migraine hx; History of prior pregnancy with IUGR newborn; Pregnancy, supervision, high-risk, third trimester; Anemia in pregnancy, first trimester; Pregnancy affected by fetal growth restriction; and Hemorrhoids during pregnancy in third trimester on their problem list. ? ?Patient reports general discomforts of pregnancy.  Contractions: Irritability. Vag. Bleeding: None.  Movement: Present. Denies leaking of fluid.  ? ?The following portions of the patient's history were reviewed and updated as appropriate: allergies, current medications, past family history, past medical history, past social history, past surgical history and problem list. Problem list updated. ? ?Objective:  ? ?Vitals:  ? 01/26/22 1452  ?BP: 128/84  ?Pulse: 99  ?Weight: 152 lb 11.2 oz (69.3 kg)  ? ? ?Fetal Status: Fetal Heart Rate (bpm): 166   Movement: Present    ? ?General:  Alert, oriented and cooperative. Patient is in no acute distress.  ?Skin: Skin is warm and dry. No rash noted.   ?Cardiovascular: Normal heart rate noted  ?Respiratory: Normal respiratory effort, no problems with respiration noted  ?Abdomen: Soft, gravid, appropriate for gestational age. Pain/Pressure: Present     ?Pelvic:  Cervical exam performed        ?Extremities: Normal range of motion.  Edema: None  ?Mental Status: Normal mood and affect. Normal behavior. Normal judgment and thought content.  ? ?Urinalysis:     ? ?Assessment and Plan:  ?Pregnancy: [id number] at [redacted]w[redacted]d ? ?1. Pregnancy, supervision, high-risk, third trimester ?Labor Precautions ?- Culture, beta strep (group b only) ?- GC/Chlamydia probe amp ([place])not at [facility] ? ?2. Pregnancy affected by fetal growth restriction ?Growth scan tomorrow ? ?Preterm labor symptoms and general obstetric precautions including  but not limited to vaginal bleeding, contractions, leaking of fluid and fetal movement were reviewed in detail with the patient. ?Please refer to After Visit Summary for other counseling recommendations.  ?Return in about 1 week (around 02/02/2022) for OB visit, face to face, MD only. ? ? ?Chancy Milroy, MD ?

## 2022-01-26 NOTE — Patient Instructions (Signed)
Vaginal Delivery ?Vaginal delivery means that you give birth by pushing your baby out of your birth canal (vagina). Your health care team will help you before, during, and after vaginal delivery. ?Birth experiences are unique for every woman and every pregnancy, and birth experiences vary depending on where you choose to give birth. ?What are the risks and benefits? ?Generally, this is safe. However, problems may occur, including: ?Bleeding. ?Infection. ?Damage to other structures such as vaginal tearing. ?Allergic reactions to medicines. ?Despite the risks, benefits of vaginal delivery include less risk of bleeding and infection and a shorter recovery time compared to a Cesarean delivery. Cesarean delivery, or C-section, is the surgical delivery of a baby. ?What happens when I arrive at the birth center or hospital? ?Once you are in labor and have been admitted into the hospital or birth center, your health care team may: ?Review your pregnancy history and any concerns that you have. ?Talk with you about your birth plan and discuss pain control options. ?Check your blood pressure, breathing, and heartbeat. ?Assess your baby's heartbeat. ?Monitor your uterus for contractions. ?Check whether your bag of water (amniotic sac) has broken (ruptured). ?Insert an IV into one of your veins. This may be used to give you fluids and medicines. ?Monitoring ?Your health care team may assess your contractions (uterine monitoring) and your baby's heart rate (fetal monitoring). You may need to be monitored: ?Often, but not continuously (intermittently). ?All the time or for long periods at a time (continuously). Continuous monitoring may be needed if: ?You are taking certain medicines, such as medicine to relieve pain or make your contractions stronger. ?You have pregnancy or labor complications. ?Monitoring may be done by: ?Placing a special stethoscope or a handheld monitoring device on your abdomen to check your baby's heartbeat  and to check for contractions. ?Placing monitors on your abdomen (external monitors) to record your baby's heartbeat and the frequency and length of contractions. ?Placing monitors inside your uterus through your vagina (internal monitors) to record your baby's heartbeat and the frequency, length, and strength of your contractions. Depending on the type of monitor, it may remain in your uterus or on your baby's head until birth. ?Telemetry. This is a type of continuous monitoring that can be done with external or internal monitors. Instead of having to stay in bed, you are able to move around. ?Physical exam ?Your health care team may perform frequent physical exams. This may include: ?Checking how and where your baby is positioned in your uterus. ?Checking your cervix to determine: ?Whether it is thinning out (effacing). ?Whether it is opening up (dilating). ?What happens during labor and delivery? ?Normal labor and delivery is divided into the following three stages: ?Stage 1 ?This is the longest stage of labor. ?Throughout this stage, you will feel contractions. Contractions generally feel mild, infrequent, and irregular at first. They get stronger, more frequent, and more regular as you move through this stage. You may have contractions about every 2-3 minutes. ?This stage ends when your cervix is completely dilated to 4 inches (10 cm) and completely effaced. ?Stage 2 ?This stage starts once your cervix is completely effaced and dilated and lasts until the delivery of your baby. ?This is the stage where you will feel an urge to push your baby out of your vagina. ?You may feel stretching and burning pain, especially when the widest part of your baby's head passes through the vaginal opening (crowning). ?Once your baby is delivered, the umbilical cord will be clamped and   cut. Timing of cutting the cord will depend on your wishes, your baby's health, and your health care provider's practices. ?Your baby will be  placed on your bare chest (skin-to-skin contact) in an upright position and covered with a warm blanket. If you are choosing to breastfeed, watch your baby for feeding cues, like rooting or sucking, and help the baby to your breast for his or her first feeding. ?Stage 3 ?This stage starts immediately after the birth of your baby and ends after you deliver the placenta. ?This stage may take anywhere from 5 to 30 minutes. ?After your baby has been delivered, you will feel contractions as your body expels the placenta. These contractions also help your uterus get smaller and reduce bleeding. ?What can I expect after labor and delivery? ?After labor is over, you and your baby will be assessed closely until you are ready to go home. Your health care team will teach you how to care for yourself and your baby. ?You and your baby may be encouraged to stay in the same room (rooming in) during your hospital stay. This will help promote early bonding and successful breastfeeding. ?Your uterus will be checked and massaged regularly (fundal massage). ?You may continue to receive fluids and medicines through an IV. ?You will have some soreness and pain in your abdomen, vagina, and the area of skin between your vaginal opening and your anus (perineum). ?If an incision was made near your vagina (episiotomy) or if you had some vaginal tearing during delivery, cold compresses may be placed on your episiotomy or your tear. This helps to reduce pain and swelling. ?It is normal to have vaginal bleeding after delivery. Wear a sanitary pad for vaginal bleeding and discharge. ?Summary ?Vaginal delivery means that you will give birth by pushing your baby out of your birth canal (vagina). ?Your health care team will monitor you and your baby throughout the stages of labor. ?After you deliver your baby, your health care team will continue to assess you and your baby to ensure you are both recovering as expected after delivery. ?This  information is not intended to replace advice given to you by your health care provider. Make sure you discuss any questions you have with your health care provider. ?Document Revised: 09/02/2020 Document Reviewed: 09/02/2020 ?Elsevier Patient Education ? 2022 [employer] ? ?

## 2022-01-27 ENCOUNTER — Ambulatory Visit: Payer: Medicaid Other | Admitting: *Deleted

## 2022-01-27 ENCOUNTER — Ambulatory Visit: Payer: Medicaid Other | Attending: Obstetrics and Gynecology

## 2022-01-27 ENCOUNTER — Encounter: Payer: Self-pay | Admitting: *Deleted

## 2022-01-27 VITALS — BP 127/81 | HR 91

## 2022-01-27 DIAGNOSIS — O365930 Maternal care for other known or suspected poor fetal growth, third trimester, not applicable or unspecified: Secondary | ICD-10-CM | POA: Diagnosis not present

## 2022-01-27 DIAGNOSIS — O365990 Maternal care for other known or suspected poor fetal growth, unspecified trimester, not applicable or unspecified: Secondary | ICD-10-CM | POA: Diagnosis present

## 2022-01-27 DIAGNOSIS — Z8759 Personal history of other complications of pregnancy, childbirth and the puerperium: Secondary | ICD-10-CM | POA: Diagnosis present

## 2022-01-27 DIAGNOSIS — Z3689 Encounter for other specified antenatal screening: Secondary | ICD-10-CM | POA: Diagnosis present

## 2022-01-27 DIAGNOSIS — Z3A36 36 weeks gestation of pregnancy: Secondary | ICD-10-CM

## 2022-01-27 DIAGNOSIS — O0993 Supervision of high risk pregnancy, unspecified, third trimester: Secondary | ICD-10-CM | POA: Insufficient documentation

## 2022-01-27 DIAGNOSIS — O09293 Supervision of pregnancy with other poor reproductive or obstetric history, third trimester: Secondary | ICD-10-CM | POA: Diagnosis not present

## 2022-01-27 DIAGNOSIS — O99011 Anemia complicating pregnancy, first trimester: Secondary | ICD-10-CM

## 2022-01-27 LAB — GC/CHLAMYDIA PROBE AMP (~~LOC~~) NOT AT ARMC
Chlamydia: NEGATIVE
Comment: NEGATIVE
Comment: NORMAL
Neisseria Gonorrhea: NEGATIVE

## 2022-01-30 LAB — CULTURE, BETA STREP (GROUP B ONLY): Strep Gp B Culture: NEGATIVE

## 2022-02-02 ENCOUNTER — Encounter: Payer: Self-pay | Admitting: Obstetrics and Gynecology

## 2022-02-02 ENCOUNTER — Telehealth (INDEPENDENT_AMBULATORY_CARE_PROVIDER_SITE_OTHER): Payer: Medicaid Other | Admitting: Obstetrics and Gynecology

## 2022-02-02 VITALS — BP 122/84 | HR 105

## 2022-02-02 DIAGNOSIS — O365930 Maternal care for other known or suspected poor fetal growth, third trimester, not applicable or unspecified: Secondary | ICD-10-CM

## 2022-02-02 DIAGNOSIS — O365990 Maternal care for other known or suspected poor fetal growth, unspecified trimester, not applicable or unspecified: Secondary | ICD-10-CM

## 2022-02-02 DIAGNOSIS — Z3A37 37 weeks gestation of pregnancy: Secondary | ICD-10-CM

## 2022-02-02 DIAGNOSIS — Z8759 Personal history of other complications of pregnancy, childbirth and the puerperium: Secondary | ICD-10-CM

## 2022-02-02 DIAGNOSIS — O0993 Supervision of high risk pregnancy, unspecified, third trimester: Secondary | ICD-10-CM

## 2022-02-02 DIAGNOSIS — O99011 Anemia complicating pregnancy, first trimester: Secondary | ICD-10-CM

## 2022-02-02 DIAGNOSIS — O99013 Anemia complicating pregnancy, third trimester: Secondary | ICD-10-CM

## 2022-02-02 DIAGNOSIS — O09293 Supervision of pregnancy with other poor reproductive or obstetric history, third trimester: Secondary | ICD-10-CM

## 2022-02-02 NOTE — Patient Instructions (Signed)
Vaginal Delivery  Vaginal delivery means that you give birth by pushing your baby out of your birth canal (vagina). Your health care team will help you before, during, and after vaginal delivery. Birth experiences are unique for every woman and every pregnancy, and birth experiences vary depending on where you choose to give birth. What are the risks and benefits? Generally, this is safe. However, problems may occur, including: Bleeding. Infection. Damage to other structures such as vaginal tearing. Allergic reactions to medicines. Despite the risks, benefits of vaginal delivery include less risk of bleeding and infection and a shorter recovery time compared to a Cesarean delivery. Cesarean delivery, or C-section, is the surgical delivery of a baby. What happens when I arrive at the birth center or hospital? Once you are in labor and have been admitted into the hospital or birth center, your health care team may: Review your pregnancy history and any concerns that you have. Talk with you about your birth plan and discuss pain control options. Check your blood pressure, breathing, and heartbeat. Assess your baby's heartbeat. Monitor your uterus for contractions. Check whether your bag of water (amniotic sac) has broken (ruptured). Insert an IV into one of your veins. This may be used to give you fluids and medicines. Monitoring Your health care team may assess your contractions (uterine monitoring) and your baby's heart rate (fetal monitoring). You may need to be monitored: Often, but not continuously (intermittently). All the time or for long periods at a time (continuously). Continuous monitoring may be needed if: You are taking certain medicines, such as medicine to relieve pain or make your contractions stronger. You have pregnancy or labor complications. Monitoring may be done by: Placing a special stethoscope or a handheld monitoring device on your abdomen to check your baby's  heartbeat and to check for contractions. Placing monitors on your abdomen (external monitors) to record your baby's heartbeat and the frequency and length of contractions. Placing monitors inside your uterus through your vagina (internal monitors) to record your baby's heartbeat and the frequency, length, and strength of your contractions. Depending on the type of monitor, it may remain in your uterus or on your baby's head until birth. Telemetry. This is a type of continuous monitoring that can be done with external or internal monitors. Instead of having to stay in bed, you are able to move around. Physical exam Your health care team may perform frequent physical exams. This may include: Checking how and where your baby is positioned in your uterus. Checking your cervix to determine: Whether it is thinning out (effacing). Whether it is opening up (dilating). What happens during labor and delivery?  Normal labor and delivery is divided into the following three stages: Stage 1 This is the longest stage of labor. Throughout this stage, you will feel contractions. Contractions generally feel mild, infrequent, and irregular at first. They get stronger, more frequent, and more regular as you move through this stage. You may have contractions about every 2-3 minutes. This stage ends when your cervix is completely dilated to 4 inches (10 cm) and completely effaced. Stage 2 This stage starts once your cervix is completely effaced and dilated and lasts until the delivery of your baby. This is the stage where you will feel an urge to push your baby out of your vagina. You may feel stretching and burning pain, especially when the widest part of your baby's head passes through the vaginal opening (crowning). Once your baby is delivered, the umbilical cord will be   clamped and cut. Timing of cutting the cord will depend on your wishes, your baby's health, and your health care provider's practices. Your baby  will be placed on your bare chest (skin-to-skin contact) in an upright position and covered with a warm blanket. If you are choosing to breastfeed, watch your baby for feeding cues, like rooting or sucking, and help the baby to your breast for his or her first feeding. Stage 3 This stage starts immediately after the birth of your baby and ends after you deliver the placenta. This stage may take anywhere from 5 to 30 minutes. After your baby has been delivered, you will feel contractions as your body expels the placenta. These contractions also help your uterus get smaller and reduce bleeding. What can I expect after labor and delivery? After labor is over, you and your baby will be assessed closely until you are ready to go home. Your health care team will teach you how to care for yourself and your baby. You and your baby may be encouraged to stay in the same room (rooming in) during your hospital stay. This will help promote early bonding and successful breastfeeding. Your uterus will be checked and massaged regularly (fundal massage). You may continue to receive fluids and medicines through an IV. You will have some soreness and pain in your abdomen, vagina, and the area of skin between your vaginal opening and your anus (perineum). If an incision was made near your vagina (episiotomy) or if you had some vaginal tearing during delivery, cold compresses may be placed on your episiotomy or your tear. This helps to reduce pain and swelling. It is normal to have vaginal bleeding after delivery. Wear a sanitary pad for vaginal bleeding and discharge. Summary Vaginal delivery means that you will give birth by pushing your baby out of your birth canal (vagina). Your health care team will monitor you and your baby throughout the stages of labor. After you deliver your baby, your health care team will continue to assess you and your baby to ensure you are both recovering as expected after delivery. This  information is not intended to replace advice given to you by your health care provider. Make sure you discuss any questions you have with your health care provider. Document Revised: 09/02/2020 Document Reviewed: 09/02/2020 Elsevier Patient Education  2023 Elsevier Inc.  

## 2022-02-02 NOTE — Progress Notes (Signed)
I connected with [name] 02/02/22 at 10:35 AM EDT by: MyChart video and verified that I am speaking with the correct person using two identifiers.  Patient is located at home and provider is located at [employer].   ?  ?I discussed the limitations, risks, security and privacy concerns of performing an evaluation and management service by MyChart video and the availability of in person appointments. I also discussed with the patient that there may be a patient responsible charge related to this service. By engaging in this virtual visit, you consent to the provision of healthcare.  Additionally, you authorize for your insurance to be billed for the services provided during this visit.  The patient expressed understanding and agreed to proceed. ? ? ? ? ? ?PRENATAL VISIT NOTE ? ?Subjective:  ?Courtney [name] is a 31 y.o. [id number] at [redacted]w[redacted]d  for virtual visit for ongoing prenatal care.  She is currently monitored for the following issues for this low-risk pregnancy and has Anxiety; Migraine hx; History of prior pregnancy with IUGR newborn; Pregnancy, supervision, high-risk, third trimester; Anemia in pregnancy, first trimester; Pregnancy affected by fetal growth restriction; and Hemorrhoids during pregnancy in third trimester on their problem list. ? ?Patient reports no complaints.  Contractions: Irritability. Vag. Bleeding: None.  Movement: Present. Denies leaking of fluid.  ? ?The following portions of the patient's history were reviewed and updated as appropriate: allergies, current medications, past family history, past medical history, past social history, past surgical history and problem list.  ? ?Objective:  ? ?Vitals:  ? 02/02/22 1038  ?BP: 122/84  ?Pulse: (!) 105  ? Self-Obtained ? ?Fetal Status:     Movement: Present    ? ?Assessment and Plan:  ?Pregnancy: [id number] at [redacted]w[redacted]d ?1. Anemia in pregnancy, first trimester ?Stable ?Continue with iron ? ?2. Pregnancy, supervision, high-risk, third  trimester ?Stable ?Labor precautions ? ?3. History of prior pregnancy with IUGR newborn ?Growth 19 %  ? ?4. Pregnancy affected by fetal growth restriction ?See above ? ?Term labor symptoms and general obstetric precautions including but not limited to vaginal bleeding, contractions, leaking of fluid and fetal movement were reviewed in detail with the patient. ? ?Return in about 1 week (around 02/09/2022) for OB visit, face to face, any provider. ? ?Future Appointments  ?Date Time Provider [place]  ?02/09/2022  1:35 PM Chancy Milroy, MD [facility]  ?02/16/2022  2:15 PM Chancy Milroy, MD [facility]  ?02/24/2022 10:15 AM Clarnce Flock, MD [facility]  ? ? ? ?Time spent on virtual visit: 8 minutes ? ?Chancy Milroy, MD ?

## 2022-02-02 NOTE — Progress Notes (Signed)
I connected with  [place] on 02/02/22 at 10:35 AM EDT by telephone and verified that I am speaking with the correct person using two identifiers. ?  ?I discussed the limitations, risks, security and privacy concerns of performing an evaluation and management service by telephone and the availability of in person appointments. I also discussed with the patient that there may be a patient responsible charge related to this service. The patient expressed understanding and agreed to proceed. ? ?Ralene Bathe, RN ?02/02/2022  10:35 AM  ?

## 2022-02-09 ENCOUNTER — Telehealth: Payer: Self-pay | Admitting: Family Medicine

## 2022-02-09 ENCOUNTER — Encounter: Payer: Medicaid Other | Admitting: Obstetrics and Gynecology

## 2022-02-09 NOTE — Telephone Encounter (Signed)
Called patient to inform of appointment move due to provider emergency, there was no answer to the call so a voicemail was left with the call back number for the office.  ?

## 2022-02-11 ENCOUNTER — Encounter: Payer: Medicaid Other | Admitting: Obstetrics and Gynecology

## 2022-02-16 ENCOUNTER — Ambulatory Visit (INDEPENDENT_AMBULATORY_CARE_PROVIDER_SITE_OTHER): Payer: Medicaid Other | Admitting: Obstetrics and Gynecology

## 2022-02-16 ENCOUNTER — Encounter: Payer: Self-pay | Admitting: Obstetrics and Gynecology

## 2022-02-16 VITALS — BP 129/86 | HR 102 | Wt 152.0 lb

## 2022-02-16 DIAGNOSIS — O0993 Supervision of high risk pregnancy, unspecified, third trimester: Secondary | ICD-10-CM

## 2022-02-16 DIAGNOSIS — O365990 Maternal care for other known or suspected poor fetal growth, unspecified trimester, not applicable or unspecified: Secondary | ICD-10-CM

## 2022-02-16 NOTE — Addendum Note (Signed)
Addended by: Hermina Staggers on: 02/16/2022 03:08 PM ? ? Modules accepted: Orders ? ?

## 2022-02-16 NOTE — Progress Notes (Signed)
Subjective:  ?Courtney [name] is a 31 y.o. [id number] at [redacted]w[redacted]d being seen today for ongoing prenatal care.  She is currently monitored for the following issues for this high-risk pregnancy and has Anxiety; Migraine hx; History of prior pregnancy with IUGR newborn; Pregnancy, supervision, high-risk, third trimester; Anemia in pregnancy, first trimester; Pregnancy affected by fetal growth restriction; and Hemorrhoids during pregnancy in third trimester on their problem list. ? ?Patient reports general discomforts of pregnancy.  Contractions: Irritability. Vag. Bleeding: None.  Movement: Present. Denies leaking of fluid.  ? ?The following portions of the patient's history were reviewed and updated as appropriate: allergies, current medications, past family history, past medical history, past social history, past surgical history and problem list. Problem list updated. ? ?Objective:  ? ?Vitals:  ? 02/16/22 1444 02/16/22 1446  ?BP: (!) 138/95 129/86  ?Pulse: (!) 102   ?Weight: 152 lb (68.9 kg)   ? ? ?Fetal Status: Fetal Heart Rate (bpm): 140   Movement: Present    ? ?General:  Alert, oriented and cooperative. Patient is in no acute distress.  ?Skin: Skin is warm and dry. No rash noted.   ?Cardiovascular: Normal heart rate noted  ?Respiratory: Normal respiratory effort, no problems with respiration noted  ?Abdomen: Soft, gravid, appropriate for gestational age. Pain/Pressure: Absent     ?Pelvic:  Cervical exam performed        ?Extremities: Normal range of motion.  Edema: None  ?Mental Status: Normal mood and affect. Normal behavior. Normal judgment and thought content.  ? ?Urinalysis:     ? ?Assessment and Plan:  ?Pregnancy: [id number] at [redacted]w[redacted]d ? ?1. Pregnancy, supervision, high-risk, third trimester ?Labor Precautions ?IOL scheduled ? ?2. Pregnancy affected by fetal growth restriction ?Growth 19 % on last scan ? ?Term labor symptoms and general obstetric precautions including but not limited to vaginal bleeding, contractions,  leaking of fluid and fetal movement were reviewed in detail with the patient. ?Please refer to After Visit Summary for other counseling recommendations.  ?Return for BPP/NST after Wednesday this week. ? ? ?Chancy Milroy, MD ? ?

## 2022-02-16 NOTE — Patient Instructions (Signed)
Vaginal Delivery  Vaginal delivery means that you give birth by pushing your baby out of your birth canal (vagina). Your health care team will help you before, during, and after vaginal delivery. Birth experiences are unique for every woman and every pregnancy, and birth experiences vary depending on where you choose to give birth. What are the risks and benefits? Generally, this is safe. However, problems may occur, including: Bleeding. Infection. Damage to other structures such as vaginal tearing. Allergic reactions to medicines. Despite the risks, benefits of vaginal delivery include less risk of bleeding and infection and a shorter recovery time compared to a Cesarean delivery. Cesarean delivery, or C-section, is the surgical delivery of a baby. What happens when I arrive at the birth center or hospital? Once you are in labor and have been admitted into the hospital or birth center, your health care team may: Review your pregnancy history and any concerns that you have. Talk with you about your birth plan and discuss pain control options. Check your blood pressure, breathing, and heartbeat. Assess your baby's heartbeat. Monitor your uterus for contractions. Check whether your bag of water (amniotic sac) has broken (ruptured). Insert an IV into one of your veins. This may be used to give you fluids and medicines. Monitoring Your health care team may assess your contractions (uterine monitoring) and your baby's heart rate (fetal monitoring). You may need to be monitored: Often, but not continuously (intermittently). All the time or for long periods at a time (continuously). Continuous monitoring may be needed if: You are taking certain medicines, such as medicine to relieve pain or make your contractions stronger. You have pregnancy or labor complications. Monitoring may be done by: Placing a special stethoscope or a handheld monitoring device on your abdomen to check your baby's  heartbeat and to check for contractions. Placing monitors on your abdomen (external monitors) to record your baby's heartbeat and the frequency and length of contractions. Placing monitors inside your uterus through your vagina (internal monitors) to record your baby's heartbeat and the frequency, length, and strength of your contractions. Depending on the type of monitor, it may remain in your uterus or on your baby's head until birth. Telemetry. This is a type of continuous monitoring that can be done with external or internal monitors. Instead of having to stay in bed, you are able to move around. Physical exam Your health care team may perform frequent physical exams. This may include: Checking how and where your baby is positioned in your uterus. Checking your cervix to determine: Whether it is thinning out (effacing). Whether it is opening up (dilating). What happens during labor and delivery?  Normal labor and delivery is divided into the following three stages: Stage 1 This is the longest stage of labor. Throughout this stage, you will feel contractions. Contractions generally feel mild, infrequent, and irregular at first. They get stronger, more frequent, and more regular as you move through this stage. You may have contractions about every 2-3 minutes. This stage ends when your cervix is completely dilated to 4 inches (10 cm) and completely effaced. Stage 2 This stage starts once your cervix is completely effaced and dilated and lasts until the delivery of your baby. This is the stage where you will feel an urge to push your baby out of your vagina. You may feel stretching and burning pain, especially when the widest part of your baby's head passes through the vaginal opening (crowning). Once your baby is delivered, the umbilical cord will be   clamped and cut. Timing of cutting the cord will depend on your wishes, your baby's health, and your health care provider's practices. Your baby  will be placed on your bare chest (skin-to-skin contact) in an upright position and covered with a warm blanket. If you are choosing to breastfeed, watch your baby for feeding cues, like rooting or sucking, and help the baby to your breast for his or her first feeding. Stage 3 This stage starts immediately after the birth of your baby and ends after you deliver the placenta. This stage may take anywhere from 5 to 30 minutes. After your baby has been delivered, you will feel contractions as your body expels the placenta. These contractions also help your uterus get smaller and reduce bleeding. What can I expect after labor and delivery? After labor is over, you and your baby will be assessed closely until you are ready to go home. Your health care team will teach you how to care for yourself and your baby. You and your baby may be encouraged to stay in the same room (rooming in) during your hospital stay. This will help promote early bonding and successful breastfeeding. Your uterus will be checked and massaged regularly (fundal massage). You may continue to receive fluids and medicines through an IV. You will have some soreness and pain in your abdomen, vagina, and the area of skin between your vaginal opening and your anus (perineum). If an incision was made near your vagina (episiotomy) or if you had some vaginal tearing during delivery, cold compresses may be placed on your episiotomy or your tear. This helps to reduce pain and swelling. It is normal to have vaginal bleeding after delivery. Wear a sanitary pad for vaginal bleeding and discharge. Summary Vaginal delivery means that you will give birth by pushing your baby out of your birth canal (vagina). Your health care team will monitor you and your baby throughout the stages of labor. After you deliver your baby, your health care team will continue to assess you and your baby to ensure you are both recovering as expected after delivery. This  information is not intended to replace advice given to you by your health care provider. Make sure you discuss any questions you have with your health care provider. Document Revised: 09/02/2020 Document Reviewed: 09/02/2020 Elsevier Patient Education  2023 Elsevier Inc.  

## 2022-02-17 ENCOUNTER — Inpatient Hospital Stay (HOSPITAL_COMMUNITY)
Admission: AD | Admit: 2022-02-17 | Discharge: 2022-02-19 | DRG: 807 | Disposition: A | Discharge status: Home / Self Care | Payer: Medicaid Other | Attending: Obstetrics and Gynecology | Admitting: Obstetrics and Gynecology

## 2022-02-17 ENCOUNTER — Other Ambulatory Visit: Payer: Self-pay

## 2022-02-17 ENCOUNTER — Encounter (HOSPITAL_COMMUNITY): Payer: Self-pay

## 2022-02-17 ENCOUNTER — Telehealth (HOSPITAL_COMMUNITY): Payer: Self-pay | Admitting: *Deleted

## 2022-02-17 DIAGNOSIS — Z3A40 40 weeks gestation of pregnancy: Secondary | ICD-10-CM

## 2022-02-17 DIAGNOSIS — Z8759 Personal history of other complications of pregnancy, childbirth and the puerperium: Secondary | ICD-10-CM

## 2022-02-17 DIAGNOSIS — O9902 Anemia complicating childbirth: Secondary | ICD-10-CM | POA: Diagnosis present

## 2022-02-17 DIAGNOSIS — O365990 Maternal care for other known or suspected poor fetal growth, unspecified trimester, not applicable or unspecified: Secondary | ICD-10-CM | POA: Diagnosis present

## 2022-02-17 DIAGNOSIS — D509 Iron deficiency anemia, unspecified: Secondary | ICD-10-CM | POA: Diagnosis present

## 2022-02-17 DIAGNOSIS — O0993 Supervision of high risk pregnancy, unspecified, third trimester: Secondary | ICD-10-CM

## 2022-02-17 DIAGNOSIS — O139 Gestational [pregnancy-induced] hypertension without significant proteinuria, unspecified trimester: Secondary | ICD-10-CM | POA: Diagnosis present

## 2022-02-17 DIAGNOSIS — O99011 Anemia complicating pregnancy, first trimester: Principal | ICD-10-CM

## 2022-02-17 DIAGNOSIS — O134 Gestational [pregnancy-induced] hypertension without significant proteinuria, complicating childbirth: Principal | ICD-10-CM | POA: Diagnosis present

## 2022-02-17 DIAGNOSIS — O99019 Anemia complicating pregnancy, unspecified trimester: Secondary | ICD-10-CM | POA: Diagnosis present

## 2022-02-17 DIAGNOSIS — O365930 Maternal care for other known or suspected poor fetal growth, third trimester, not applicable or unspecified: Secondary | ICD-10-CM | POA: Diagnosis present

## 2022-02-17 NOTE — Telephone Encounter (Signed)
Preadmission screen  

## 2022-02-18 ENCOUNTER — Telehealth (HOSPITAL_COMMUNITY): Payer: Self-pay | Admitting: *Deleted

## 2022-02-18 ENCOUNTER — Inpatient Hospital Stay (HOSPITAL_COMMUNITY): Payer: Medicaid Other | Admitting: Anesthesiology

## 2022-02-18 ENCOUNTER — Encounter (HOSPITAL_COMMUNITY): Payer: Self-pay | Admitting: Obstetrics and Gynecology

## 2022-02-18 ENCOUNTER — Other Ambulatory Visit: Payer: Medicaid Other

## 2022-02-18 DIAGNOSIS — O365930 Maternal care for other known or suspected poor fetal growth, third trimester, not applicable or unspecified: Secondary | ICD-10-CM | POA: Diagnosis present

## 2022-02-18 DIAGNOSIS — O9902 Anemia complicating childbirth: Secondary | ICD-10-CM

## 2022-02-18 DIAGNOSIS — D509 Iron deficiency anemia, unspecified: Secondary | ICD-10-CM | POA: Diagnosis present

## 2022-02-18 DIAGNOSIS — Z3A40 40 weeks gestation of pregnancy: Secondary | ICD-10-CM | POA: Diagnosis not present

## 2022-02-18 DIAGNOSIS — O139 Gestational [pregnancy-induced] hypertension without significant proteinuria, unspecified trimester: Secondary | ICD-10-CM

## 2022-02-18 DIAGNOSIS — O134 Gestational [pregnancy-induced] hypertension without significant proteinuria, complicating childbirth: Secondary | ICD-10-CM

## 2022-02-18 HISTORY — DX: Gestational (pregnancy-induced) hypertension without significant proteinuria, unspecified trimester: O13.9

## 2022-02-18 LAB — CBC
HCT: 38.1 % (ref 36.0–46.0)
Hemoglobin: 13.1 g/dL (ref 12.0–15.0)
MCH: 29.1 pg (ref 26.0–34.0)
MCHC: 34.4 g/dL (ref 30.0–36.0)
MCV: 84.7 fL (ref 80.0–100.0)
Platelets: 228 10*3/uL (ref 150–400)
RBC: 4.5 MIL/uL (ref 3.87–5.11)
RDW: 15.2 % (ref 11.5–15.5)
WBC: 9.1 10*3/uL (ref 4.0–10.5)
nRBC: 0 % (ref 0.0–0.2)

## 2022-02-18 LAB — COMPREHENSIVE METABOLIC PANEL
ALT: 12 U/L (ref 0–44)
AST: 17 U/L (ref 15–41)
Albumin: 3.2 g/dL — ABNORMAL LOW (ref 3.5–5.0)
Alkaline Phosphatase: 139 U/L — ABNORMAL HIGH (ref 38–126)
Anion gap: 10 (ref 5–15)
BUN: <5 mg/dL — ABNORMAL LOW (ref 6–20)
CO2: 19 mmol/L — ABNORMAL LOW (ref 22–32)
Calcium: 8.9 mg/dL (ref 8.9–10.3)
Chloride: 106 mmol/L (ref 98–111)
Creatinine, Ser: 0.57 mg/dL (ref 0.44–1.00)
GFR, Estimated: >60 mL/min (ref >60)
Glucose, Bld: 87 mg/dL (ref 70–99)
Potassium: 3.7 mmol/L (ref 3.5–5.1)
Sodium: 135 mmol/L (ref 135–145)
Total Bilirubin: 0.6 mg/dL (ref 0.3–1.2)
Total Protein: 7.3 g/dL (ref 6.5–8.1)

## 2022-02-18 LAB — RPR: RPR Ser Ql: NONREACTIVE

## 2022-02-18 LAB — PROTEIN / CREATININE RATIO, URINE
Creatinine, Urine: 48.64 mg/dL
Total Protein, Urine: <6 mg/dL

## 2022-02-18 LAB — TYPE AND SCREEN
ABO/RH(D): O POS
Antibody Screen: NEGATIVE

## 2022-02-18 MED ORDER — OXYCODONE-ACETAMINOPHEN 5-325 MG PO TABS: 1.0000 | ORAL_TABLET | ORAL | Status: DC | PRN

## 2022-02-18 MED ORDER — DIPHENHYDRAMINE HCL 25 MG PO CAPS: 25.0000 mg | ORAL_CAPSULE | Freq: Four times a day (QID) | ORAL | Status: DC | PRN

## 2022-02-18 MED ORDER — WITCH HAZEL-GLYCERIN EX PADS: 1.0000 "application " | MEDICATED_PAD | CUTANEOUS | Status: DC | PRN

## 2022-02-18 MED ORDER — FUROSEMIDE 20 MG PO TABS
20.0000 mg | ORAL_TABLET | Freq: Every day | ORAL | Status: DC
  Administered 2022-02-19: 20 mg via ORAL
  Filled 2022-02-18: qty 1

## 2022-02-18 MED ORDER — ONDANSETRON HCL 4 MG/2ML IJ SOLN: 4.0000 mg | Freq: Four times a day (QID) | INTRAMUSCULAR | Status: DC | PRN

## 2022-02-18 MED ORDER — SENNOSIDES-DOCUSATE SODIUM 8.6-50 MG PO TABS
2.0000 | ORAL_TABLET | Freq: Every day | ORAL | Status: DC
  Administered 2022-02-19: 2 via ORAL
  Filled 2022-02-18: qty 2

## 2022-02-18 MED ORDER — EPHEDRINE 5 MG/ML INJ: 10.0000 mg | INTRAVENOUS | Status: DC | PRN

## 2022-02-18 MED ORDER — PRENATAL MULTIVITAMIN CH
1.0000 | ORAL_TABLET | Freq: Every day | ORAL | Status: DC
  Administered 2022-02-18 – 2022-02-19 (×2): 1 via ORAL
  Filled 2022-02-18 (×2): qty 1

## 2022-02-18 MED ORDER — IBUPROFEN 600 MG PO TABS
600.0000 mg | ORAL_TABLET | Freq: Four times a day (QID) | ORAL | Status: DC
  Administered 2022-02-18 – 2022-02-19 (×5): 600 mg via ORAL
  Filled 2022-02-18 (×5): qty 1

## 2022-02-18 MED ORDER — LIDOCAINE HCL (PF) 1 % IJ SOLN: 30.0000 mL | INTRAMUSCULAR | Status: DC | PRN

## 2022-02-18 MED ORDER — ACETAMINOPHEN 325 MG PO TABS: 650.0000 mg | ORAL_TABLET | ORAL | Status: DC | PRN

## 2022-02-18 MED ORDER — LACTATED RINGERS IV SOLN
500.0000 mL | Freq: Once | INTRAVENOUS | Status: AC
  Administered 2022-02-18: 500 mL via INTRAVENOUS

## 2022-02-18 MED ORDER — OXYTOCIN-SODIUM CHLORIDE 30-0.9 UT/500ML-% IV SOLN
2.5000 [IU]/h | INTRAVENOUS | Status: DC
  Filled 2022-02-18: qty 500

## 2022-02-18 MED ORDER — DIPHENHYDRAMINE HCL 50 MG/ML IJ SOLN: 12.5000 mg | INTRAMUSCULAR | Status: DC | PRN

## 2022-02-18 MED ORDER — ACETAMINOPHEN 325 MG PO TABS
650.0000 mg | ORAL_TABLET | ORAL | Status: DC | PRN
Start: 2022-02-18 — End: 2022-02-18

## 2022-02-18 MED ORDER — OXYTOCIN BOLUS FROM INFUSION
333.0000 mL | Freq: Once | INTRAVENOUS | Status: AC
  Administered 2022-02-18: 333 mL via INTRAVENOUS

## 2022-02-18 MED ORDER — OXYCODONE-ACETAMINOPHEN 5-325 MG PO TABS: 2.0000 | ORAL_TABLET | ORAL | Status: DC | PRN

## 2022-02-18 MED ORDER — COCONUT OIL OIL
1.0000 "application " | TOPICAL_OIL | Status: DC | PRN
  Administered 2022-02-19: 1 via TOPICAL

## 2022-02-18 MED ORDER — SIMETHICONE 80 MG PO CHEW: 80.0000 mg | CHEWABLE_TABLET | ORAL | Status: DC | PRN

## 2022-02-18 MED ORDER — TRANEXAMIC ACID-NACL 1000-0.7 MG/100ML-% IV SOLN
1000.0000 mg | Freq: Once | INTRAVENOUS | Status: AC | PRN
  Administered 2022-02-18: 1000 mg via INTRAVENOUS

## 2022-02-18 MED ORDER — LACTATED RINGERS IV SOLN: INTRAVENOUS | Status: DC

## 2022-02-18 MED ORDER — PHENYLEPHRINE 80 MCG/ML (10ML) SYRINGE FOR IV PUSH (FOR BLOOD PRESSURE SUPPORT)
80.0000 ug | PREFILLED_SYRINGE | INTRAVENOUS | Status: DC | PRN
  Filled 2022-02-18: qty 10

## 2022-02-18 MED ORDER — BENZOCAINE-MENTHOL 20-0.5 % EX AERO: 1.0000 "application " | INHALATION_SPRAY | CUTANEOUS | Status: DC | PRN

## 2022-02-18 MED ORDER — LACTATED RINGERS IV SOLN
500.0000 mL | INTRAVENOUS | Status: DC | PRN
  Administered 2022-02-18: 1000 mL via INTRAVENOUS

## 2022-02-18 MED ORDER — PHENYLEPHRINE 80 MCG/ML (10ML) SYRINGE FOR IV PUSH (FOR BLOOD PRESSURE SUPPORT)
80.0000 ug | PREFILLED_SYRINGE | INTRAVENOUS | Status: DC | PRN
  Administered 2022-02-18 (×2): 80 ug via INTRAVENOUS

## 2022-02-18 MED ORDER — LIDOCAINE-EPINEPHRINE (PF) 2 %-1:200000 IJ SOLN
INTRAMUSCULAR | Status: DC | PRN
  Administered 2022-02-18: 5 mL via EPIDURAL

## 2022-02-18 MED ORDER — TRANEXAMIC ACID-NACL 1000-0.7 MG/100ML-% IV SOLN
INTRAVENOUS | Status: AC
  Filled 2022-02-18: qty 100

## 2022-02-18 MED ORDER — ONDANSETRON HCL 4 MG/2ML IJ SOLN: 4.0000 mg | INTRAMUSCULAR | Status: DC | PRN

## 2022-02-18 MED ORDER — ONDANSETRON HCL 4 MG PO TABS: 4.0000 mg | ORAL_TABLET | ORAL | Status: DC | PRN

## 2022-02-18 MED ORDER — FENTANYL CITRATE (PF) 100 MCG/2ML IJ SOLN: 50.0000 ug | INTRAMUSCULAR | Status: DC | PRN

## 2022-02-18 MED ORDER — FENTANYL-BUPIVACAINE-NACL 0.5-0.125-0.9 MG/250ML-% EP SOLN
12.0000 mL/h | EPIDURAL | Status: DC | PRN
  Administered 2022-02-18: 12 mL/h via EPIDURAL
  Filled 2022-02-18: qty 250

## 2022-02-18 MED ORDER — SOD CITRATE-CITRIC ACID 500-334 MG/5ML PO SOLN: 30.0000 mL | ORAL | Status: DC | PRN

## 2022-02-18 MED ORDER — DIBUCAINE (PERIANAL) 1 % EX OINT: 1.0000 "application " | TOPICAL_OINTMENT | CUTANEOUS | Status: DC | PRN

## 2022-02-18 NOTE — MAU Note (Signed)
.  Courtney [name] is a 31 y.o. at [redacted]w[redacted]d here in MAU reporting contractions since 1000 Tues. Had appt Monday and was 3cm and had membrane sweep. Denies VB or LOF. Good FM  ?LMP: [facility] 02/17/22 ?Onset of complaint: 1000 Tues170 ?Pain score: 6 ?Vitals:  ? 02/17/22 2359 02/18/22 0000  ?BP:  129/90  ?Pulse: 96   ?Resp: 16   ?Temp: 98.9 ?F (37.2 ?C)   ?SpO2: 100%   ?   ?FHT:170 (baby moving) ?Lab orders placed from triage:  mau labor ? ?

## 2022-02-18 NOTE — H&P (Signed)
OBSTETRIC ADMISSION HISTORY AND PHYSICAL ? ?Courtney [name] is a 31 y.o. female [phone] with IUP at 42w0dby 6 week [place] for early labor, found to have gestational HTN. She reports +FMs, no LOF, no VB, no blurry vision, headaches, peripheral edema, or RUQ pain.  She plans on breast and formula feeding. She is planning for IUD placement for birth control postpartum.  ? ?[facility]  ? ?Dating: By [place]--->  Estimated Date of Delivery: 02/18/22 ? ?Sono:   ?_0 , normal anatomy, cephalic presentation, anterior placental lie, 2669 g, 19% EFW ? ?Prenatal History/Complications:  ?FGR (resolved on most recent [place] ?History of prior pregnancy with FGR ?Iron deficiency anemia  ? ?Past Medical History: ?Past Medical History:  ?Diagnosis Date  ? Anemia   ? Migraine   ? SVD (12/23) 10/12/2019  ? Trichomonal vaginitis during pregnancy   ? ? ?Past Surgical History: ?Past Surgical History:  ?Procedure Laterality Date  ? NO PAST SURGERIES    ? WISDOM TOOTH EXTRACTION    ? ? ?Obstetrical History: ?OB History   ? ? Gravida  ?5  ? Para  ?3  ? Term  ?3  ? Preterm  ?0  ? AB  ?1  ? Living  ?4  ?  ? ? SAB  ?1  ? IAB  ?0  ? Ectopic  ?0  ? Multiple  ?1  ? Live Births  ?4  ?   ?  ?  ? ? ?Social History ?Social History  ? ?Socioeconomic History  ? Marital status: Married  ?  Spouse name: ADustyn Armbrister ? Number of children: Not on file  ? Years of education: Not on file  ? Highest education level: Not on file  ?Occupational History  ? Not on file  ?Tobacco Use  ? Smoking status: Never  ? Smokeless tobacco: Never  ?Vaping Use  ? Vaping Use: Never used  ?Substance and Sexual Activity  ? Alcohol use: No  ? Drug use: No  ? Sexual activity: Yes  ?  Birth control/protection: None  ?Other Topics Concern  ? 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 One child   ? Married   ? ?Social Determinants of Health  ? ?Financial Resource Strain: Not on file  ?Food Insecurity: No Food Insecurity  ? Worried  About RCharity fundraiserin the Last Year: Never true  ? Ran Out of Food in the Last Year: Never true  ?Transportation Needs: No Transportation Needs  ? Lack of Transportation (Medical): No  ? Lack of Transportation (Non-Medical): No  ?Physical Activity: Not on file  ?Stress: Not on file  ?Social Connections: Not on file  ? ? ?Family History: ?Family History  ?Problem Relation Age of Onset  ? Diabetes Mother   ? Multiple sclerosis Mother   ? Fibromyalgia Mother   ? Kidney disease Father   ? Hypertension Father   ? Diabetes Maternal Uncle   ? Diabetes Maternal Grandmother   ? Cancer Paternal Grandfather   ?     lung  ? Autism Paternal Uncle   ?     half-uncle ( husbands brother)   ? Hypertension Maternal Grandfather   ? Heart attack Maternal Grandfather   ? Stroke Maternal Grandfather   ? Hypertension Paternal Grandmother   ? Stroke Paternal Grandmother   ? Heart disease Paternal Grandmother   ? ? ?Allergies: ?No Known Allergies ? ?Medications Prior to Admission  ?  Medication Sig Dispense Refill Last Dose  ? ferrous sulfate 325 (65 FE) MG tablet Take 1 tablet (325 mg total) by mouth every other day. 15 tablet 11 Past Month  ? Prenatal Vit-Fe Fumarate-FA (PREPLUS) 27-1 MG TABS Take 1 tablet by mouth daily. 30 tablet 12 02/17/2022  ? Blood Pressure Monitoring (BLOOD PRESSURE KIT) DEVI 1 Device by Does not apply route as needed. 1 each 0   ? ? ? ?Review of Systems  ?All systems reviewed and negative except as stated in HPI ? ?Blood pressure 120/83, pulse (!) 103, temperature 98.3 ?F (36.8 ?C), temperature source Oral, resp. rate 18, height _0  (1.575 m), weight 68.9 kg, last menstrual period 04/26/2021, SpO2 100 %. ? ?General appearance: alert, cooperative, and no distress ?Lungs: normal work of breathing on room air  ?Heart: normal rate, warm and well perfused  ?Abdomen: soft, non-tender, gravid  ?Extremities: no LE edema or calf tenderness to palpation  ? ?Presentation:  Cephalic per RN ?Fetal monitoring: Baseline 135  bpm, moderate variability, + accels, no decels  ?Uterine activity: Every 4-6 minutes  ?Dilation: 4.5 ?Effacement (%): 80 ?Station: Ballotable ?Exam by:: Blima Singer RNC ? ?Prenatal labs: ?ABO, Rh: --/--/O POS (05/03 1607) ?Antibody: NEG (05/03 0049) ?Rubella: 6.84 (10/10 1433) ?RPR: Non Reactive (02/13 0854)  ?HBsAg: Negative (10/10 1433)  ?HIV: Non Reactive (02/13 0854)  ?GBS: Negative/-- (04/10 1515)  ?2 hr Glucola normal  ?Genetic screening LR NIPS ?Anatomy US normal  ? ?Prenatal Transfer Tool  ?Maternal Diabetes: No ?Genetic Screening: Normal ?Maternal Ultrasounds/Referrals: FGR, resolved  ?Fetal Ultrasounds or other Referrals:  None ?Maternal Substance Abuse:  No ?Significant Maternal Medications:  None ?Significant Maternal Lab Results: Group B Strep negative ? ?Results for orders placed or performed during the hospital encounter of 02/17/22 (from the past 24 hour(s))  ?Protein / creatinine ratio, urine  ? Collection Time: 02/18/22 12:10 AM  ?Result Value Ref Range  ? Creatinine, Urine 48.64 mg/dL  ? Total Protein, Urine <6 mg/dL  ? Protein Creatinine Ratio        0.00 - 0.15 mg/mg[Cre]  ?CBC  ? Collection Time: 02/18/22 12:49 AM  ?Result Value Ref Range  ? WBC 9.1 4.0 - 10.5 K/uL  ? RBC 4.50 3.87 - 5.11 MIL/uL  ? Hemoglobin 13.1 12.0 - 15.0 g/dL  ? HCT 38.1 36.0 - 46.0 %  ? MCV 84.7 80.0 - 100.0 fL  ? MCH 29.1 26.0 - 34.0 pg  ? MCHC 34.4 30.0 - 36.0 g/dL  ? RDW 15.2 11.5 - 15.5 %  ? Platelets 228 150 - 400 K/uL  ? nRBC 0.0 0.0 - 0.2 %  ?Comprehensive metabolic panel  ? Collection Time: 02/18/22 12:49 AM  ?Result Value Ref Range  ? Sodium 135 135 - 145 mmol/L  ? Potassium 3.7 3.5 - 5.1 mmol/L  ? Chloride 106 98 - 111 mmol/L  ? CO2 19 (L) 22 - 32 mmol/L  ? Glucose, Bld 87 70 - 99 mg/dL  ? BUN <5 (L) 6 - 20 mg/dL  ? Creatinine, Ser 0.57 0.44 - 1.00 mg/dL  ? Calcium 8.9 8.9 - 10.3 mg/dL  ? Total Protein 7.3 6.5 - 8.1 g/dL  ? Albumin 3.2 (L) 3.5 - 5.0 g/dL  ? AST 17 15 - 41 U/L  ? ALT 12 0 - 44 U/L  ? Alkaline  Phosphatase 139 (H) 38 - 126 U/L  ? Total Bilirubin 0.6 0.3 - 1.2 mg/dL  ? GFR, Estimated >60 >60 mL/min  ? Anion  gap 10 5 - 15  ?Type and screen  ? Collection Time: 02/18/22 12:49 AM  ?Result Value Ref Range  ? ABO/RH(D) O POS   ? Antibody Screen NEG   ? Sample Expiration    ?  02/21/2022,2359 ?Performed at [facility], [place] [address] [place], [place] [address] ?  ? ? ?Patient Active Problem List  ? Diagnosis Date Noted  ? Gestational hypertension 02/18/2022  ? Hemorrhoids during pregnancy in third trimester 01/12/2022  ? Pregnancy affected by fetal growth restriction 01/01/2022  ? Iron deficiency anemia during pregnancy 07/29/2021  ? Pregnancy, supervision, high-risk, third trimester 07/14/2021  ? Anxiety 10/11/2019  ? Migraine hx 10/11/2019  ? History of prior pregnancy with IUGR newborn 10/11/2019  ? ? ?Assessment/Plan:  ?Hinata Diener is a 31 y.o. [id number] at 45w0dhere for early labor, found to have gestational HTN.  ? ?#Labor: Initially contracting every 2-3 minutes, now contracting every 4-6 minutes. Offered AROM for augmentation. Patient plans to get epidural first, then agreeable to AROM afterwards. Will reassess once epidural is in place.  ?#Pain: Requesting epidural; will notify anesthesia  ?#FWB: Cat 1 ?#ID:  [id number] ?#MOF: Breast and formula  ?#MOC: IUD outpatient ? ?#Gestational HTN: BP mild range in MAU. Had elevated BP in office on 5/1 as well. No symptoms. CBC, CMP, urine P:C ratio within normal range. Will continue to monitor.  ? ?#Iron deficiency anemia: Hgb on admission 11.0.  ? ?CGenia Del MD  ?02/18/2022, 4:32 AM ? ? ? ?

## 2022-02-18 NOTE — Lactation Note (Signed)
This note was copied from a baby's chart. ?Lactation Consultation Note ? ?Patient Name: Courtney [name] ?Today's Date: 02/18/2022 ?Reason for consult: Initial assessment;Term ?Age:30 hours ?P5, term female infant. ?Mom is experienced at breastfeeding see maternal data below. ?LC entered room, infant cuing to BF, mom latched infant on her left breast using the cross cradle hold, infant latched with depth and was still BF after 13 minutes when LC left the room. ?Mom will attempt to latch infant on both breast during a feeding, continue to BF infant according to feeding cues, on demand, 8 to 12+ or more times within 24 hours, skin to skin. ?Mom knows to call RN/LC if she needs further latch assistance. ?Mom made aware of O/P services, breastfeeding support groups, community resources, and our phone # for post-discharge questions.   ?Maternal Data ?Has patient been taught Hand Expression?: Yes ?Does the patient have breastfeeding experience prior to this delivery?: Yes ?How long did the patient breastfeed?: Per mom she BF 1st child for 8 months, twins ( 2& 3rd) child only 2 months due having mastitis, 4 th child who is currently 56 years old for 6 months. ? ?Feeding ?Mother's Current Feeding Choice: Breast Milk ? ?LATCH Score ?Latch: Grasps breast easily, tongue down, lips flanged, rhythmical sucking. ? ?Audible Swallowing: Spontaneous and intermittent ? ?Type of Nipple: Everted at rest and after stimulation ? ?Comfort (Breast/Nipple): Soft / non-tender ? ?Hold (Positioning): Assistance needed to correctly position infant at breast and maintain latch. ? ?LATCH Score: 9 ? ? ?Lactation Tools Discussed/Used ?  ? ?Interventions ?Interventions: Breast feeding basics reviewed;Assisted with latch;Skin to skin;Breast compression;Adjust position;Support pillows;Position options;Education;LC Services brochure ? ?Discharge ?  ? ?Consult Status ?Consult Status: Follow-up ?Date: 02/19/22 ?Follow-up type: In-patient ? ? ? ?Danelle Earthly ?02/18/2022, 9:09 PM ? ? ? ?

## 2022-02-18 NOTE — Progress Notes (Addendum)
To BS via w/c. FHR 135 when EFM removed for transfer ?

## 2022-02-18 NOTE — Discharge Summary (Signed)
? ?  Postpartum Discharge Summary ? ?   ?Patient Name: Courtney [name] ?DOB: 1990/11/19 ?MRN: [id number] ? ?Date of admission: 02/17/2022 ?Delivery date:02/18/2022  ?Delivering provider: Genia Del  ?Date of discharge: 02/19/2022 ? ?Admitting diagnosis: Gestational hypertension [O13.9] ?Intrauterine pregnancy: [redacted]w[redacted]d    ?Secondary diagnosis:  Principal Problem: ?  Vaginal delivery ?Active Problems: ?  History of prior pregnancy with IUGR newborn ?  Pregnancy, supervision, high-risk, third trimester ?  Iron deficiency anemia during pregnancy ?  Pregnancy affected by fetal growth restriction ?  Gestational hypertension ? ?Additional problems: None     ?Discharge diagnosis: Term Pregnancy Delivered                                              ?Post partum procedures: None ?Augmentation: AROM ?Complications: None ? ?Hospital course: Onset of Labor With Vaginal Delivery      ?31y.o. yo [id number] admitted in Latent Labor on 02/17/2022, and was also found to have gestational hypertension. Patient had an uncomplicated labor course and progressed to complete after AROM. She had a normal vaginal delivery.  ?Membrane Rupture Time/Date: 6:15 AM ,02/18/2022   ?Delivery Method:Vaginal, Spontaneous  ?Episiotomy: None  ?Lacerations:  None  ?Patient had an uncomplicated postpartum course.  Her BP remained within normal range postpartum.  She was started on Lasix, which she will continue to complete a 5 day course.  She is ambulating, tolerating a regular diet, passing flatus, and urinating well.  Her pain and bleeding are controlled.  She is formula feeding well.  Patient is discharged home in stable condition on 02/19/22. ? ?Newborn Data: ?Birth date:02/18/2022  ?Birth time:7:26 AM  ?Gender:Female  ?Living status:Living  ?Apgars:9 ,9  ?Weight:3080 g  ? ?Magnesium Sulfate received: No ?BMZ received: No ?Rhophylac: N/A ?MMR: N/A ?T-DaP: Given prenatally ?Flu: No ?Transfusion: No ? ?Physical exam  ?Vitals:  ? 02/18/22 1449  02/18/22 1842 02/18/22 2300 02/19/22 0610  ?BP: 111/74 100/65 114/69 105/67  ?Pulse: 89 83 97 69  ?Resp: 18 18 17 17   ?Temp: 98.2 ?F (36.8 ?C) 98.3 ?F (36.8 ?C) 98.4 ?F (36.9 ?C) 98.2 ?F (36.8 ?C)  ?TempSrc: Oral Oral Oral Oral  ?SpO2: 100% 97% 98% 100%  ?Weight:      ?Height:      ? ?General: alert, cooperative, and no distress ?Lochia: appropriate ?Uterine Fundus: firm and below umbilicus  ?DVT Evaluation: no LE edema or calf tenderness to palpation  ? ?Labs: ?Lab Results  ?Component Value Date  ? WBC 9.1 02/18/2022  ? HGB 13.1 02/18/2022  ? HCT 38.1 02/18/2022  ? MCV 84.7 02/18/2022  ? PLT 228 02/18/2022  ? ? ?  Latest Ref Rng & Units 02/18/2022  ? 12:49 AM  ?CMP  ?Glucose 70 - 99 mg/dL 87    ?BUN 6 - 20 mg/dL <5    ?Creatinine 0.44 - 1.00 mg/dL 0.57    ?Sodium 135 - 145 mmol/L 135    ?Potassium 3.5 - 5.1 mmol/L 3.7    ?Chloride 98 - 111 mmol/L 106    ?CO2 22 - 32 mmol/L 19    ?Calcium 8.9 - 10.3 mg/dL 8.9    ?Total Protein 6.5 - 8.1 g/dL 7.3    ?Total Bilirubin 0.3 - 1.2 mg/dL 0.6    ?Alkaline Phos 38 - 126 U/L 139    ?AST 15 -  41 U/L 17    ?ALT 0 - 44 U/L 12    ? ?Edinburgh Score: ? ?  05/02/2018  ?  9:51 AM  ?Flavia Shipper Postnatal Depression Scale Screening Tool  ?I have been able to laugh and see the funny side of things. 0  ?I have looked forward with enjoyment to things. 0  ?I have blamed myself unnecessarily when things went wrong. 0  ?I have been anxious or worried for no good reason. 0  ?I have felt scared or panicky for no good reason. 0  ?Things have been getting on top of me. 0  ?I have been so unhappy that I have had difficulty sleeping. 0  ?I have felt sad or miserable. 0  ?I have been so unhappy that I have been crying. 0  ?The thought of harming myself has occurred to me. 0  ?Edinburgh Postnatal Depression Scale Total 0  ? ? ? ?After visit meds:  ?Allergies as of 02/19/2022   ?No Known Allergies ?  ? ?  ?Medication List  ?  ? ?TAKE these medications   ? ?acetaminophen 500 MG tablet ?Commonly known as:  TYLENOL ?Take 2 tablets (1,000 mg total) by mouth every 8 (eight) hours as needed (pain). ?  ?Blood Pressure Kit Devi ?1 Device by Does not apply route as needed. ?  ?ferrous sulfate 325 (65 FE) MG tablet ?Take 1 tablet (325 mg total) by mouth every other day. ?  ?furosemide 20 MG tablet ?Commonly known as: LASIX ?Take 1 tablet (20 mg total) by mouth daily for 4 days. ?  ?ibuprofen 600 MG tablet ?Commonly known as: ADVIL ?Take 1 tablet (600 mg total) by mouth every 6 (six) hours as needed (pain). ?  ?PrePLUS 27-1 MG Tabs ?Take 1 tablet by mouth daily. ?  ? ?  ? ? ? ?Discharge home in stable condition ?Infant Feeding: Bottle and Breast ?Infant Disposition: home with mother ?Discharge instruction: per After Visit Summary and Postpartum booklet. ?Activity: Advance as tolerated. Pelvic rest for 6 weeks.  ?Diet: routine diet ?Future Appointments: ?Future Appointments  ?Date Time Provider [place]  ?02/25/2022  9:20 AM [facility] NURSE [facility]  ?03/30/2022  2:15 PM Chancy Milroy, MD [facility]  ? ?Follow up Visit: ?Message sent to [facility] by Dr. Gwenlyn Perking on 02/18/22.  ? ?Please schedule this patient for a In person postpartum visit in 6 weeks with the following provider: Any provider. ?Additional Postpartum F/U: BP check 1 week  ?High risk pregnancy complicated by: FGR (resolved) and gHTN ?Delivery mode:  Vaginal, Spontaneous  ?Anticipated Birth Control:  IUD outpatient  ? ?02/19/2022 ?Genia Del, MD ? ? ? ?

## 2022-02-18 NOTE — Telephone Encounter (Signed)
Preadmission screen  

## 2022-02-18 NOTE — Anesthesia Procedure Notes (Signed)
Epidural ?Patient location during procedure: OB ?Start time: 02/18/2022 4:40 AM ?End time: 02/18/2022 4:50 AM ? ?Staffing ?Anesthesiologist: Elmer Picker, MD ?Performed: anesthesiologist  ? ?Preanesthetic Checklist ?Completed: patient identified, IV checked, risks and benefits discussed, monitors and equipment checked, pre-op evaluation and timeout performed ? ?Epidural ?Patient position: sitting ?Prep: DuraPrep and site prepped and draped ?Patient monitoring: continuous pulse ox, blood pressure, heart rate and cardiac monitor ?Approach: midline ?Location: L3-L4 ?Injection technique: LOR air ? ?Needle:  ?Needle type: Tuohy  ?Needle gauge: 17 G ?Needle length: 9 cm ?Needle insertion depth: 5 cm ?Catheter type: closed end flexible ?Catheter size: 19 Gauge ?Catheter at skin depth: 10 cm ?Test dose: negative ? ?Assessment ?Sensory level: T8 ?Events: blood not aspirated, injection not painful, no injection resistance, no paresthesia and negative IV test ? ?Additional Notes ?Patient identified. Risks/Benefits/Options discussed with patient including but not limited to bleeding, infection, nerve damage, paralysis, failed block, incomplete pain control, headache, blood pressure changes, nausea, vomiting, reactions to medication both or allergic, itching and postpartum back pain. Confirmed with bedside nurse the patient's most recent platelet count. Confirmed with patient that they are not currently taking any anticoagulation, have any bleeding history or any family history of bleeding disorders. Patient expressed understanding and wished to proceed. All questions were answered. Sterile technique was used throughout the entire procedure. Please see nursing notes for vital signs. Test dose was given through epidural catheter and negative prior to continuing to dose epidural or start infusion. Warning signs of high block given to the patient including shortness of breath, tingling/numbness in hands, complete motor block, or  any concerning symptoms with instructions to call for help. Patient was given instructions on fall risk and not to get out of bed. All questions and concerns addressed with instructions to call with any issues or inadequate analgesia.  Reason for block:procedure for pain ? ? ? ?

## 2022-02-18 NOTE — Anesthesia Postprocedure Evaluation (Signed)
Anesthesia Post Note ? ?Patient: Courtney [name] ? ?Procedure(s) Performed: AN AD HOC LABOR EPIDURAL ? ?  ? ?Patient location during evaluation: Mother Baby ?Anesthesia Type: Epidural ?Level of consciousness: awake ?Pain management: satisfactory to patient ?Vital Signs Assessment: post-procedure vital signs reviewed and stable ?Respiratory status: spontaneous breathing ?Cardiovascular status: stable ?Anesthetic complications: no ? ? ?No notable events documented. ? ?Last Vitals:  ?Vitals:  ? 02/18/22 0949 02/18/22 1052  ?BP: 114/78 117/83  ?Pulse: 97 (!) 101  ?Resp:  18  ?Temp: 36.7 ?C 36.9 ?C  ?SpO2: 99% 99%  ?  ?Last Pain:  ?Vitals:  ? 02/18/22 1257  ?TempSrc:   ?PainSc: 6   ? ?Pain Goal: Patients Stated Pain Goal: 0 (02/18/22 0003) ? ?  ?  ?  ?  ?  ?  ?  ? ?Dquan Cortopassi ? ? ? ? ?

## 2022-02-18 NOTE — Progress Notes (Signed)
Labor Progress Note ?Courtney [name] is a 31 y.o. [id number] at [redacted]w[redacted]d who presented for early labor, found to have gHTN.  ? ?S: Doing well. Comfortable with epidural. No concerns at this time.  ? ?O:  ?BP 115/76   Pulse (!) 124   Temp 98.4 ?F (36.9 ?C) (Oral)   Resp 18   Ht 5\' 2"  (1.575 m)   Wt 68.9 kg   LMP 04/26/2021   SpO2 99%   BMI 27.80 kg/m?  ? ?EFM: Baseline 145 bpm, moderate variability, + accels, no decels  ?Toco: Every 5 minutes  ? ?CVE: Dilation: 4.5 ?Effacement (%): 50 ?Cervical Position: Anterior ?Station: -3 ?Presentation: Vertex ?Exam by:: Dr. [email or web address] ? ?A&P: 31 y.o. 37 [redacted]w[redacted]d  ? ?#Labor: Progressing well. AROM performed with moderate amount of clear fluid. Mom and baby tolerated this well. Contractions now every 3 minutes after AROM. Will continue expectant management and reassess in 3 hours, sooner as needed if patient feels pressure or urge to push.  ?#Pain: Epidural  ?#FWB: Cat 1  ?#GBS negative ? ?#gHTN: BP normal to mild range. No symptoms. Will continue to monitor.  ? ?[redacted]w[redacted]d, MD ?6:26 AM ? ?

## 2022-02-18 NOTE — Plan of Care (Signed)
?  Problem: Education: ?Goal: Knowledge of Childbirth will improve ?Outcome: Progressing ?Goal: Ability to make informed decisions regarding treatment and plan of care will improve ?Outcome: Progressing ?Goal: Ability to state and carry out methods to decrease the pain will improve ?Outcome: Progressing ?Goal: Individualized Educational Video(s) ?Outcome: Not Applicable ?  ?Problem: Coping: ?Goal: Ability to verbalize concerns and feelings about labor and delivery will improve ?Outcome: Progressing ?  ?

## 2022-02-18 NOTE — Anesthesia Preprocedure Evaluation (Signed)
Anesthesia Evaluation  ?Patient identified by MRN, date of birth, ID band ?Patient awake ? ? ? ?Reviewed: ?Allergy & Precautions, NPO status , Patient's Chart, lab work & pertinent test results ? ?Airway ?Mallampati: II ? ?TM Distance: >3 FB ?Neck ROM: Full ? ? ? Dental ?no notable dental hx. ? ?  ?Pulmonary ?neg pulmonary ROS,  ?  ?Pulmonary exam normal ?breath sounds clear to auscultation ? ? ? ? ? ? Cardiovascular ?hypertension (gHTN), Normal cardiovascular exam ?Rhythm:Regular Rate:Normal ? ? ?  ?Neuro/Psych ? Headaches, PSYCHIATRIC DISORDERS Anxiety   ? GI/Hepatic ?negative GI ROS, Neg liver ROS,   ?Endo/Other  ?negative endocrine ROS ? Renal/GU ?negative Renal ROS  ?negative genitourinary ?  ?Musculoskeletal ?negative musculoskeletal ROS ?(+)  ? Abdominal ?  ?Peds ? Hematology ?negative hematology ROS ?(+)   ?Anesthesia Other Findings ?Presented in labor found to have gHTN ? Reproductive/Obstetrics ?(+) Pregnancy ? ?  ? ? ? ? ? ? ? ? ? ? ? ? ? ?  ?  ? ? ? ? ? ? ? ? ?Anesthesia Physical ?Anesthesia Plan ? ?ASA: 3 ? ?Anesthesia Plan: Epidural  ? ?Post-op Pain Management:   ? ?Induction:  ? ?PONV Risk Score and Plan: Treatment may vary due to age or medical condition ? ?Airway Management Planned: Natural Airway ? ?Additional Equipment:  ? ?Intra-op Plan:  ? ?Post-operative Plan:  ? ?Informed Consent: I have reviewed the patients History and Physical, chart, labs and discussed the procedure including the risks, benefits and alternatives for the proposed anesthesia with the patient or authorized representative who has indicated his/her understanding and acceptance.  ? ? ? ? ? ?Plan Discussed with: Anesthesiologist ? ?Anesthesia Plan Comments: (Patient identified. Risks, benefits, options discussed with patient including but not limited to bleeding, infection, nerve damage, paralysis, failed block, incomplete pain control, headache, blood pressure changes, nausea, vomiting,  reactions to medication, itching, and post partum back pain. Confirmed with bedside nurse the patient's most recent platelet count. Confirmed with the patient that they are not taking any anticoagulation, have any bleeding history or any family history of bleeding disorders. Patient expressed understanding and wishes to proceed. All questions were answered. )  ? ? ? ? ? ? ?Anesthesia Quick Evaluation ? ?

## 2022-02-19 ENCOUNTER — Other Ambulatory Visit (HOSPITAL_COMMUNITY): Payer: Self-pay

## 2022-02-19 LAB — BIRTH TISSUE RECOVERY COLLECTION (PLACENTA DONATION)

## 2022-02-19 MED ORDER — ACETAMINOPHEN 500 MG PO TABS
1000.0000 mg | ORAL_TABLET | Freq: Three times a day (TID) | ORAL | 0 refills | Status: DC | PRN
  Filled 2022-02-19: qty 60, 10d supply, fill #0

## 2022-02-19 MED ORDER — FUROSEMIDE 20 MG PO TABS
20.0000 mg | ORAL_TABLET | Freq: Every day | ORAL | 0 refills | Status: DC
  Filled 2022-02-19: qty 4, 4d supply, fill #0

## 2022-02-19 MED ORDER — IBUPROFEN 600 MG PO TABS
600.0000 mg | ORAL_TABLET | Freq: Four times a day (QID) | ORAL | 0 refills | Status: DC | PRN
  Filled 2022-02-19: qty 40, 10d supply, fill #0

## 2022-02-19 NOTE — Lactation Note (Signed)
This note was copied from a baby's chart. ?Lactation Consultation Note ? ?Patient Name: Courtney [name] ?Today's Date: 02/19/2022 ?Reason for consult: Follow-up assessment ?Age:30 hours ? ?P4, Baby latched upon entering with intermittent swallows. ?Discussed monitoring voids/stools. ?Mother denies questions or concerns.  ? ?Feeding ?Mother's Current Feeding Choice: Breast Milk ? ?LATCH Score ?Latch: Grasps breast easily, tongue down, lips flanged, rhythmical sucking. (latched upon entering) ? ?Audible Swallowing: A few with stimulation ? ?Type of Nipple: Everted at rest and after stimulation ? ?Comfort (Breast/Nipple): Soft / non-tender ? ?Hold (Positioning): No assistance needed to correctly position infant at breast. ? ?LATCH Score: 9 ? ? ?Interventions ?Interventions: Education ? ?Discharge ?Discharge Education: Engorgement and breast care ? ?Consult Status ?Consult Status: Complete ?Date: 02/19/22 ? ? ? ?Dahlia Byes Boschen ?02/19/2022, 11:34 AM ? ? ? ?

## 2022-02-21 ENCOUNTER — Inpatient Hospital Stay (HOSPITAL_COMMUNITY): Payer: Medicaid Other

## 2022-02-21 ENCOUNTER — Inpatient Hospital Stay (HOSPITAL_COMMUNITY)
Admission: AD | Admit: 2022-02-21 | Discharge: 2022-02-21 | Disposition: A | Discharge status: Home / Self Care | Payer: Medicaid Other | Attending: Obstetrics & Gynecology | Admitting: Obstetrics & Gynecology

## 2022-02-21 ENCOUNTER — Inpatient Hospital Stay (HOSPITAL_COMMUNITY)
Admission: AD | Admit: 2022-02-21 | Payer: Medicaid Other | Source: Home / Self Care | Admitting: Obstetrics & Gynecology

## 2022-02-21 ENCOUNTER — Other Ambulatory Visit: Payer: Self-pay

## 2022-02-21 ENCOUNTER — Encounter (HOSPITAL_COMMUNITY): Payer: Self-pay | Admitting: Obstetrics & Gynecology

## 2022-02-21 DIAGNOSIS — O165 Unspecified maternal hypertension, complicating the puerperium: Secondary | ICD-10-CM | POA: Diagnosis not present

## 2022-02-21 LAB — CBC WITH DIFFERENTIAL/PLATELET
Abs Immature Granulocytes: 0.01 10*3/uL (ref 0.00–0.07)
Basophils Absolute: 0 10*3/uL (ref 0.0–0.1)
Basophils Relative: 0 %
Eosinophils Absolute: 0.1 10*3/uL (ref 0.0–0.5)
Eosinophils Relative: 2 %
HCT: 36.5 % (ref 36.0–46.0)
Hemoglobin: 12 g/dL (ref 12.0–15.0)
Immature Granulocytes: 0 %
Lymphocytes Relative: 25 %
Lymphs Abs: 1.5 10*3/uL (ref 0.7–4.0)
MCH: 27.9 pg (ref 26.0–34.0)
MCHC: 32.9 g/dL (ref 30.0–36.0)
MCV: 84.9 fL (ref 80.0–100.0)
Monocytes Absolute: 0.3 10*3/uL (ref 0.1–1.0)
Monocytes Relative: 5 %
Neutro Abs: 4 10*3/uL (ref 1.7–7.7)
Neutrophils Relative %: 68 %
Platelets: 241 10*3/uL (ref 150–400)
RBC: 4.3 MIL/uL (ref 3.87–5.11)
RDW: 14.6 % (ref 11.5–15.5)
WBC: 5.9 10*3/uL (ref 4.0–10.5)
nRBC: 0 % (ref 0.0–0.2)

## 2022-02-21 LAB — COMPREHENSIVE METABOLIC PANEL
ALT: 11 U/L (ref 0–44)
AST: 22 U/L (ref 15–41)
Albumin: 2.9 g/dL — ABNORMAL LOW (ref 3.5–5.0)
Alkaline Phosphatase: 105 U/L (ref 38–126)
Anion gap: 7 (ref 5–15)
BUN: <5 mg/dL — ABNORMAL LOW (ref 6–20)
CO2: 23 mmol/L (ref 22–32)
Calcium: 8.6 mg/dL — ABNORMAL LOW (ref 8.9–10.3)
Chloride: 106 mmol/L (ref 98–111)
Creatinine, Ser: 0.65 mg/dL (ref 0.44–1.00)
GFR, Estimated: >60 mL/min (ref >60)
Glucose, Bld: 83 mg/dL (ref 70–99)
Potassium: 3.5 mmol/L (ref 3.5–5.1)
Sodium: 136 mmol/L (ref 135–145)
Total Bilirubin: 0.6 mg/dL (ref 0.3–1.2)
Total Protein: 6.6 g/dL (ref 6.5–8.1)

## 2022-02-21 MED ORDER — NIFEDIPINE ER OSMOTIC RELEASE 30 MG PO TB24: 30.0000 mg | ORAL_TABLET | Freq: Every day | ORAL | 1 refills | Status: DC

## 2022-02-21 NOTE — MAU Note (Signed)
Courtney [name] is a 32 y.o. here in MAU reporting: PP SVD on 5/3. Was d/c on lasix, forgot to take her dose yesterday or today. BP at home since yesterday have been 132/90, 140/98, and 148/112. No headaches, visual changes, or RUQ pain. ? ?Onset of complaint: yesterday ? ?Pain score: 0/10 ? ?Vitals:  ? 02/21/22 1156  ?BP: (!) 136/98  ?Pulse: 90  ?Resp: 16  ?Temp: 98.6 ?F (37 ?C)  ?SpO2: 99%  ?   ?Lab orders placed from triage: none ? ?

## 2022-02-21 NOTE — MAU Provider Note (Signed)
?History  ?  ? ?CSN: [id number] ? ?Arrival date and time: 02/21/22 1145 ? ? Event Date/Time  ? First Provider Initiated Contact with Patient 02/21/22 1210   ?  ? ?Chief Complaint  ?Patient presents with  ? Hypertension  ? ?HPI ?Courtney [name] is a 31 y.o. [id number] postpartum from a vaginal delivery on 5/3 who presents for evaluation of elevated blood pressures. She reports she was discharged home on lasix and forgot to take her dose yesterday. She reports checking her blood pressure at home and it was in the 140s/100s. She denies any headache, visual changes or epigastric pain.  ? ?OB History   ? ? Gravida  ?5  ? Para  ?4  ? Term  ?4  ? Preterm  ?0  ? AB  ?1  ? Living  ?5  ?  ? ? SAB  ?1  ? IAB  ?0  ? Ectopic  ?0  ? Multiple  ?1  ? Live Births  ?5  ?   ?  ?  ? ? ?Past Medical History:  ?Diagnosis Date  ? Anemia   ? Migraine   ? SVD (12/23) 10/12/2019  ? Trichomonal vaginitis during pregnancy   ? ? ?Past Surgical History:  ?Procedure Laterality Date  ? NO PAST SURGERIES    ? WISDOM TOOTH EXTRACTION    ? ? ?Family History  ?Problem Relation Age of Onset  ? Diabetes Mother   ? Multiple sclerosis Mother   ? Fibromyalgia Mother   ? Kidney disease Father   ? Hypertension Father   ? Diabetes Maternal Uncle   ? Diabetes Maternal Grandmother   ? Cancer Paternal Grandfather   ?     lung  ? Autism Paternal Uncle   ?     half-uncle ( husbands brother)   ? Hypertension Maternal Grandfather   ? Heart attack Maternal Grandfather   ? Stroke Maternal Grandfather   ? Hypertension Paternal Grandmother   ? Stroke Paternal Grandmother   ? Heart disease Paternal Grandmother   ? ? ?Social History  ? ?Tobacco Use  ? Smoking status: Never  ? Smokeless tobacco: Never  ?Vaping Use  ? Vaping Use: Never used  ?Substance Use Topics  ? Alcohol use: No  ? Drug use: No  ? ? ?Allergies: No Known Allergies ? ?Medications Prior to Admission  ?Medication Sig Dispense Refill Last Dose  ? acetaminophen (TYLENOL) 500 MG tablet Take 2 tablets (1,000 mg  total) by mouth every 8 (eight) hours as needed (pain). 60 tablet 0 Past Month  ? ferrous sulfate 325 (65 FE) MG tablet Take 1 tablet (325 mg total) by mouth every other day. 15 tablet 11 Past Month  ? furosemide (LASIX) 20 MG tablet Take 1 tablet (20 mg total) by mouth daily for 4 days. 4 tablet 0 02/20/2022  ? ibuprofen (ADVIL) 600 MG tablet Take 1 tablet (600 mg total) by mouth every 6 (six) hours as needed (pain). 40 tablet 0 Past Week  ? Prenatal Vit-Fe Fumarate-FA (PREPLUS) 27-1 MG TABS Take 1 tablet by mouth daily. 30 tablet 12 02/20/2022  ? Blood Pressure Monitoring (BLOOD PRESSURE KIT) DEVI 1 Device by Does not apply route as needed. 1 each 0   ? ? ?Review of Systems  ?Constitutional: Negative.  Negative for fatigue and fever.  ?HENT: Negative.    ?Respiratory: Negative.  Negative for shortness of breath.   ?Cardiovascular: Negative.  Negative for chest pain.  ?Gastrointestinal: Negative.  Negative for abdominal pain, constipation,  diarrhea, nausea and vomiting.  ?Genitourinary: Negative.  Negative for dysuria, vaginal bleeding and vaginal discharge.  ?Neurological: Negative.  Negative for dizziness and headaches.  ?Physical Exam  ? ?Blood pressure 131/89, pulse 77, temperature 98.6 ?F (37 ?C), temperature source Oral, resp. rate 16, height _0  (1.575 m), weight 65 kg, SpO2 99 %, currently breastfeeding. ? ?Patient Vitals for the past 24 hrs: ? BP Temp Temp src Pulse Resp SpO2 Height Weight  ?02/21/22 1334 (!) 128/91 -- -- 80 15 -- -- --  ?02/21/22 1331 126/90 -- -- 77 -- -- -- --  ?02/21/22 1316 126/87 -- -- 69 -- -- -- --  ?02/21/22 1301 121/87 -- -- 73 -- -- -- --  ?02/21/22 1246 122/89 -- -- 84 -- -- -- --  ?02/21/22 1230 123/86 -- -- 76 -- -- -- --  ?02/21/22 1215 126/87 -- -- 78 -- -- -- --  ?02/21/22 1206 131/89 -- -- 77 -- -- -- --  ?02/21/22 1156 (!) 136/98 98.6 ?F (37 ?C) Oral 90 16 99 % -- --  ?02/21/22 1153 -- -- -- -- -- -- _1  (1.575 m) 65 kg  ? ? ?Physical Exam ?Vitals and nursing note  reviewed.  ?Constitutional:   ?   General: She is not in acute distress. ?   Appearance: She is well-developed.  ?HENT:  ?   Head: Normocephalic.  ?Eyes:  ?   Pupils: Pupils are equal, round, and reactive to light.  ?Cardiovascular:  ?   Rate and Rhythm: Normal rate and regular rhythm.  ?   Heart sounds: Normal heart sounds.  ?Pulmonary:  ?   Effort: Pulmonary effort is normal. No respiratory distress.  ?   Breath sounds: Normal breath sounds.  ?Abdominal:  ?   General: Bowel sounds are normal. There is no distension.  ?   Palpations: Abdomen is soft.  ?   Tenderness: There is no abdominal tenderness.  ?Skin: ?   General: Skin is warm and dry.  ?Neurological:  ?   Mental Status: She is alert and oriented to person, place, and time.  ?Psychiatric:     ?   Mood and Affect: Mood normal.     ?   Behavior: Behavior normal.     ?   Thought Content: Thought content normal.     ?   Judgment: Judgment normal.  ? ? ?MAU Course  ?Procedures ?Results for orders placed or performed during the hospital encounter of 02/21/22 (from the past 24 hour(s))  ?CBC with Differential/Platelet     Status: None  ? Collection Time: 02/21/22 12:18 PM  ?Result Value Ref Range  ? WBC 5.9 4.0 - 10.5 K/uL  ? RBC 4.30 3.87 - 5.11 MIL/uL  ? Hemoglobin 12.0 12.0 - 15.0 g/dL  ? HCT 36.5 36.0 - 46.0 %  ? MCV 84.9 80.0 - 100.0 fL  ? MCH 27.9 26.0 - 34.0 pg  ? MCHC 32.9 30.0 - 36.0 g/dL  ? RDW 14.6 11.5 - 15.5 %  ? Platelets 241 150 - 400 K/uL  ? nRBC 0.0 0.0 - 0.2 %  ? Neutrophils Relative % 68 %  ? Neutro Abs 4.0 1.7 - 7.7 K/uL  ? Lymphocytes Relative 25 %  ? Lymphs Abs 1.5 0.7 - 4.0 K/uL  ? Monocytes Relative 5 %  ? Monocytes Absolute 0.3 0.1 - 1.0 K/uL  ? Eosinophils Relative 2 %  ? Eosinophils Absolute 0.1 0.0 - 0.5 K/uL  ? Basophils Relative 0 %  ?  Basophils Absolute 0.0 0.0 - 0.1 K/uL  ? Immature Granulocytes 0 %  ? Abs Immature Granulocytes 0.01 0.00 - 0.07 K/uL  ?Comprehensive metabolic panel     Status: Abnormal  ? Collection Time: 02/21/22  12:18 PM  ?Result Value Ref Range  ? Sodium 136 135 - 145 mmol/L  ? Potassium 3.5 3.5 - 5.1 mmol/L  ? Chloride 106 98 - 111 mmol/L  ? CO2 23 22 - 32 mmol/L  ? Glucose, Bld 83 70 - 99 mg/dL  ? BUN <5 (L) 6 - 20 mg/dL  ? Creatinine, Ser 0.65 0.44 - 1.00 mg/dL  ? Calcium 8.6 (L) 8.9 - 10.3 mg/dL  ? Total Protein 6.6 6.5 - 8.1 g/dL  ? Albumin 2.9 (L) 3.5 - 5.0 g/dL  ? AST 22 15 - 41 U/L  ? ALT 11 0 - 44 U/L  ? Alkaline Phosphatase 105 38 - 126 U/L  ? Total Bilirubin 0.6 0.3 - 1.2 mg/dL  ? GFR, Estimated >60 >60 mL/min  ? Anion gap 7 5 - 15  ?  ?MDM ?CBC with Diff ?CMP ? ?Discussed normal labs but several elevated blood pressures above 130s and upper 36I and 16D diastolic. Recommended starting PO hypertensive medication. Patient agreeable to plan of care and has BP check scheduled on 5/10.  ? ?Assessment and Plan  ? ?1. Postpartum hypertension   ? ?-Discharge home in stable condition ?-Rx for procardia sent to patient's pharmacy ?-Preeclampsia precautions discussed ?-Patient advised to follow-up with OB as scheduled for postpartum care ?-Patient may return to MAU as needed or if her condition were to change or worsen ? ? ?Wende Mott CNM ?02/21/2022, 12:10 PM  ?

## 2022-02-24 ENCOUNTER — Encounter: Payer: Medicaid Other | Admitting: Family Medicine

## 2022-02-25 ENCOUNTER — Ambulatory Visit (INDEPENDENT_AMBULATORY_CARE_PROVIDER_SITE_OTHER): Payer: Medicaid Other

## 2022-02-25 ENCOUNTER — Telehealth: Payer: Medicaid Other | Admitting: Physician Assistant

## 2022-02-25 VITALS — BP 135/98 | HR 81 | Wt 138.6 lb

## 2022-02-25 DIAGNOSIS — N644 Mastodynia: Secondary | ICD-10-CM

## 2022-02-25 DIAGNOSIS — Z0130 Encounter for examination of blood pressure without abnormal findings: Secondary | ICD-10-CM

## 2022-02-25 DIAGNOSIS — N898 Other specified noninflammatory disorders of vagina: Secondary | ICD-10-CM

## 2022-02-25 DIAGNOSIS — O3460 Maternal care for abnormality of vagina, unspecified trimester: Secondary | ICD-10-CM

## 2022-02-25 NOTE — Progress Notes (Signed)
Blood Pressure Check Visit ? ?Courtney [name] is here for blood pressure check following a  vaginal delivery on 02/18/22. BP today is 139/97. BP recheck approximately 5-10 minutes later 135/98. Patient takes Nifedipine 30 mg daily. Patient states she took her first dose of the Nifedipine last night. Patient denies any dizziness, blurred vision, headache, shortness of breath, peripheral edema. Reviewed with Dr. Macon Large. Per Dr. Macon Large patient should continue taking Nifedipine 30 mg daily and return in 1 week for BP check. I reviewed this with patient. I also reviewed signs and symptoms of pre-eclampsia with patient. I encouraged patient to continue checking her BP at home daily. Patient verbalized understanding and denies any other questions.  ? ?Cline Crock, RN ?02/25/2022   ?

## 2022-02-25 NOTE — Progress Notes (Signed)
For the safety of you and your child, I recommend a face to face office visit with a health care provider. ? ?Many mothers need to take medicines while nursing.  Almost all medicines pass into the breast milk in small quantities.  Most are generally considered safe for a mother to take but some medicines must be avoided.  After reviewing your E-Visit request, I recommend that you consult your OB/GYN for medical advice in relation to your condition and prescription medications while pregnant or breastfeeding. Giving you just had your little one, we want to make sure that the cause of vaginal symptoms is just yeast and not a combination of things. We also need to see if the issue with breasts in truly related to yeast or if concern of bacterial infection from cracking in the tissue of the nipples/breast from breastfeeding.  ? ?NOTE:  There will be NO CHARGE for this eVisit ? ?If you are having a true medical emergency please call 911.   ? ?For an urgent face to face visit, [place] has six urgent care centers for your convenience:  ?  ? [facility] ?Get Driving Directions ?[phone] ?[phone] ?[place], [place] [address] ?  ? [facility]) ?Get Driving Directions ?[phone] ?[address] ?[place], [place] [address] ? ?[facility]) ?Get Driving Directions ?[phone] ?[address] ?[place],  [place]  [address] ? ?[facility] ?Get Driving Directions ?[phone] ?[address], [address] ?[place], [place] [address] ?  ?[facility] [place] ?Get Driving Directions  ?[phone] ?[address] ?[address] ?[place], [place] [address] ?  ?[facility] ?Get Driving Directions ?[phone] ?[address] ?[place], [place] [address] ? ?Your MyChart E-visit questionnaire answers were  reviewed by a board certified advanced clinical practitioner to complete your personal care plan based on your specific symptoms.  Thank you for using e-Visits. ?  ? ?

## 2022-02-26 ENCOUNTER — Ambulatory Visit: Payer: Medicaid Other

## 2022-02-27 ENCOUNTER — Ambulatory Visit: Payer: Medicaid Other

## 2022-03-02 ENCOUNTER — Encounter: Payer: Self-pay | Admitting: *Deleted

## 2022-03-04 ENCOUNTER — Ambulatory Visit: Payer: Medicaid Other

## 2022-03-18 ENCOUNTER — Telehealth: Payer: Self-pay | Admitting: Lactation Services

## 2022-03-18 MED ORDER — NYSTATIN 100000 UNIT/GM EX CREA: TOPICAL_CREAM | CUTANEOUS | 1 refills | Status: DC

## 2022-03-18 NOTE — Telephone Encounter (Signed)
Called and spoke with mom. Mom reports [name] has Thrush (Dx. Yesterday) and she had this with her other babies also.   Mom is pumping and offering EBM. She has stopped latching infant to the breast. She is  having sore nipples for about 3 weeks. She is having intermittent shooting pains in the breast when breasts are filling not when emptying.   Nystatin cream sent to pharmacy. Advise mom to check her My Chart message for other recommendations and reviewed while on the phone. Patient voiced understanding to all the above information.   Patient will reach back out with any further questions or concerns or not improving.

## 2022-03-18 NOTE — Addendum Note (Signed)
Addended by: Ed Blalock on: 03/18/2022 03:10 PM   Modules accepted: Orders

## 2022-03-18 NOTE — Telephone Encounter (Signed)
Called mom as has concerns with sore nipples x 3 weeks and breast feeding an infant with Thrush. Mom did not answer. LM for her to call the office at [phone] at her convenience to discuss concerns. Will send Mychart message.

## 2022-03-27 NOTE — Progress Notes (Unsigned)
    Post Partum Visit Note  Courtney [name] is a 31 y.o. [phone] female who presents for a postpartum visit. She is 5 weeks postpartum following a normal spontaneous vaginal delivery.  I have fully reviewed the prenatal and intrapartum course. The delivery was at [redacted]w[redacted]d gestational weeks.  Anesthesia: epidural. Postpartum course has been ***. Baby is doing well***. Baby is feeding by {breastmilk/bottle:69}. Bleeding {vag bleed:12292}. Bowel function is {normal:32111}. Bladder function is {normal:32111}. Patient {is/is not:9024} sexually active. Contraception method is {contraceptive method:5051}. Postpartum depression screening: {gen negative/positive:315881}.   The pregnancy intention screening data noted above was reviewed. Potential methods of contraception were discussed. The patient elected to proceed with No data recorded.    Health Maintenance Due  Topic Date Due   PAP-Cervical Cytology Screening  03/07/2022   PAP SMEAR-Modifier  03/07/2022    {Common ambulatory SmartLinks:19316}  Review of Systems {ros; complete:30496}  Objective:  There were no vitals taken for this visit.   General:  {gen appearance:16600}   Breasts:  {desc; normal/abnormal/not indicated:14647}  Lungs: {lung exam:16931}  Heart:  {heart exam:5510}  Abdomen: {abdomen exam:16834}   Wound {Wound assessment:11097}  GU exam:  {desc; normal/abnormal/not indicated:14647}       Assessment:    There are no diagnoses linked to this encounter.  *** postpartum exam.   Plan:   Essential components of care per ACOG recommendations:  1.  Mood and well being: Patient with {gen negative/positive:315881} depression screening today. Reviewed local resources for support.  - Patient tobacco use? {tobacco use:25506}  - hx of drug use? {yes/no:25505}    2. Infant care and feeding:  -Patient currently breastmilk feeding? {yes/no:25502}  -Social determinants of health (SDOH) reviewed in EPIC. No concerns***The  following needs were identified***  3. Sexuality, contraception and birth spacing - Patient {DOES_DOES WFU:93235} want a pregnancy in the next year.  Desired family size is {NUMBER 1-10:22536} children.  - Reviewed reproductive life planning. Reviewed contraceptive methods based on pt preferences and effectiveness.  Patient desired {Upstream End Methods:24109} today.   - Discussed birth spacing of 18 months  4. Sleep and fatigue -Encouraged family/partner/community support of 4 hrs of uninterrupted sleep to help with mood and fatigue  5. Physical Recovery  - Discussed patients delivery and complications. She describes her labor as {description:25511} - Patient had a {CHL AMB [phone]}. Patient had a {laceration:25518} laceration. Perineal healing reviewed. Patient expressed understanding - Patient has urinary incontinence? {yes/no:25515} - Patient {ACTION; IS/IS TDD:22025427} safe to resume physical and sexual activity  6.  Health Maintenance - HM due items addressed {Yes or If no, why not?:20788} - Last pap smear  Diagnosis  Date Value Ref Range Status  09/22/2017   Final   NEGATIVE FOR INTRAEPITHELIAL LESIONS OR MALIGNANCY.   Pap smear {done:10129} at today's visit.  -Breast Cancer screening indicated? {indicated:25516}  7. Chronic Disease/Pregnancy Condition follow up: {Follow up:25499}  - PCP follow up  Aviva Signs, CMA Center for [facility], [facility]

## 2022-03-30 ENCOUNTER — Encounter: Payer: Self-pay | Admitting: Obstetrics and Gynecology

## 2022-03-30 ENCOUNTER — Ambulatory Visit (INDEPENDENT_AMBULATORY_CARE_PROVIDER_SITE_OTHER): Payer: Medicaid Other | Admitting: Obstetrics and Gynecology

## 2022-03-30 ENCOUNTER — Other Ambulatory Visit (HOSPITAL_COMMUNITY)
Admission: RE | Admit: 2022-03-30 | Discharge: 2022-03-30 | Disposition: A | Discharge status: Home / Self Care | Payer: Medicaid Other | Source: Ambulatory Visit | Attending: Obstetrics and Gynecology | Admitting: Obstetrics and Gynecology

## 2022-03-30 VITALS — BP 120/89 | HR 99 | Ht 62.0 in | Wt 133.0 lb

## 2022-03-30 DIAGNOSIS — Z309 Encounter for contraceptive management, unspecified: Secondary | ICD-10-CM | POA: Insufficient documentation

## 2022-03-30 DIAGNOSIS — Z124 Encounter for screening for malignant neoplasm of cervix: Secondary | ICD-10-CM | POA: Insufficient documentation

## 2022-03-30 DIAGNOSIS — Z30011 Encounter for initial prescription of contraceptive pills: Secondary | ICD-10-CM

## 2022-03-30 DIAGNOSIS — O139 Gestational [pregnancy-induced] hypertension without significant proteinuria, unspecified trimester: Secondary | ICD-10-CM | POA: Diagnosis not present

## 2022-03-30 HISTORY — DX: Encounter for contraceptive management, unspecified: Z30.9

## 2022-03-30 HISTORY — DX: Encounter for screening for malignant neoplasm of cervix: Z12.4

## 2022-03-30 MED ORDER — NORETHINDRONE 0.35 MG PO TABS
1.0000 | ORAL_TABLET | Freq: Every day | ORAL | 11 refills | Status: DC
Start: 2022-03-30 — End: 2023-06-18

## 2022-03-30 NOTE — Progress Notes (Signed)
[place] Partum Visit Note   Courtney [name] is a 31 y.o. [phone] female who presents for a postpartum visit. She is 5 weeks postpartum following a normal spontaneous vaginal delivery.  I have fully reviewed the prenatal and intrapartum course. The delivery was at [redacted]w[redacted]d gestational weeks.  Anesthesia: epidural. Postpartum course has been unremarkable. Baby is doing well. Baby is feeding by breast. Bleeding no bleeding. Bowel function is normal. Bladder function is normal. Patient is not sexually active. Contraception method is none. Postpartum depression screening: negative.   The pregnancy intention screening data noted above was reviewed. Potential methods of contraception were discussed. The patient elected to proceed with No data recorded.   Edinburgh Postnatal Depression Scale - 03/30/22 1429       Edinburgh Postnatal Depression Scale:  In the Past 7 Days   I have been able to laugh and see the funny side of things. 0    I have looked forward with enjoyment to things. 0    I have blamed myself unnecessarily when things went wrong. 0    I have been anxious or worried for no good reason. 0    I have felt scared or panicky for no good reason. 0    Things have been getting on top of me. 0    I have been so unhappy that I have had difficulty sleeping. 0    I have felt sad or miserable. 0    I have been so unhappy that I have been crying. 0    The thought of harming myself has occurred to me. 0    Edinburgh Postnatal Depression Scale Total 0             Health Maintenance Due  Topic Date Due   PAP-Cervical Cytology Screening  03/07/2022   PAP SMEAR-Modifier  03/07/2022    The following portions of the patient's history were reviewed and updated as appropriate: allergies, current medications, past family history, past medical history, past social history, past surgical history, and problem list.  Review of Systems Pertinent items noted in HPI and remainder of comprehensive ROS  otherwise negative.  Objective:  BP 120/89   Pulse 99   Ht 5\' 2"  (1.575 m)   Wt 133 lb (60.3 kg)   Breastfeeding Yes   BMI 24.33 kg/m    General:  alert   Breasts:  not indicated  Lungs: clear to auscultation bilaterally  Heart:  regular rate and rhythm, S1, S2 normal, no murmur, click, rub or gallop  Abdomen: soft, non-tender; bowel sounds normal; no masses,  no organomegaly   Wound NA  GU exam:  normal       Assessment:    There are no diagnoses linked to this encounter.  Nl postpartum exam.  GHTN Plan:   Essential components of care per ACOG recommendations:  1.  Mood and well being: Patient with negative depression screening today. Reviewed local resources for support.  - Patient tobacco use? No.   - hx of drug use? No.    2. Infant care and feeding:  -Patient currently breastmilk feeding? Yes. Discussed returning to work and pumping. Reviewed importance of draining breast regularly to support lactation.  -Social determinants of health (SDOH) reviewed in EPIC. No concerns  3. Sexuality, contraception and birth spacing - Patient does not want a pregnancy in the next year.  Desired family size is uncertain  - Reviewed reproductive life planning. Reviewed contraceptive methods based on pt preferences and effectiveness.  Patient desired Oral Contraceptive today.   - Discussed birth spacing of 18 months  4. Sleep and fatigue -Encouraged family/partner/community support of 4 hrs of uninterrupted sleep to help with mood and fatigue  5. Physical Recovery  - Discussed patients delivery and complications. She describes her labor as good. - Patient had a Vaginal, no problems at delivery. Patient had a 1st degree laceration. Perineal healing reviewed. Patient expressed understanding - Patient has urinary incontinence? No. - Patient is safe to resume physical and sexual activity  6.  Health Maintenance - HM due items addressed Yes - Last pap smear  Diagnosis  Date Value  Ref Range Status  09/22/2017   Final   NEGATIVE FOR INTRAEPITHELIAL LESIONS OR MALIGNANCY.   Pap smear done at today's visit.  -Breast Cancer screening indicated? No.   7. Chronic Disease/Pregnancy Condition follow up: None Will continue with Procardia x 1 more week and then d/c. F/U in 3 weeks for nurse visit for BP check  Arlina Robes, MD Center for [employer], [place]

## 2022-03-30 NOTE — Patient Instructions (Signed)

## 2022-03-31 LAB — CYTOLOGY - PAP: Diagnosis: NEGATIVE

## 2022-04-20 ENCOUNTER — Ambulatory Visit (INDEPENDENT_AMBULATORY_CARE_PROVIDER_SITE_OTHER): Payer: Medicaid Other

## 2022-04-20 VITALS — BP 122/86 | HR 77 | Wt 126.3 lb

## 2022-04-20 DIAGNOSIS — Z0130 Encounter for examination of blood pressure without abnormal findings: Secondary | ICD-10-CM

## 2022-04-20 NOTE — Patient Instructions (Signed)
[facility] Address: [address], [place], [place] [address] Phone: [phone]   Indigestion:  Tums  Maalox  Mylanta  Zantac  Pepcid

## 2022-04-20 NOTE — Progress Notes (Signed)
Blood Pressure Check Visit  Courtney [name] is here for blood pressure check following spontaneous vaginal delivery on 02/18/22. Patient was taking Nifedipine postpartum and was instructed at her postpartum visit to stop taking the medication and then come in for a BP recheck. BP today is 126/86. Patient denies any dizziness, blurred vision, headache, shortness of breath, peripheral edema. Patient denies any other concerns.   Cline Crock, RN 04/20/2022

## 2023-06-16 NOTE — Progress Notes (Signed)
HPI: Ms.Courtney [name] is a 32 y.o. female, who is here today to establish care.  Former PCP: Shirline Frees, NP Last preventive routine visit: 12/11/22.  Today she would like to discuss birth control options and HLD.   HLD: She has a significant family history of HLD and cardiovascular disease, affecting both her parents and her grandparents.  She has not been consistent with following a low fat diet. States that it is challenging due to her responsibilities as a mother of five children.  She reports a particular craving for sweets, which developed after the birth of her youngest child, who is now almost [age] years old.  She loves ice cream. She denies any hx of preeclampsia or gestational diabetes but hx of gestational hypertension.  Lab Results  Component Value Date   CHOL 236 (H) 06/09/2018   HDL 64.40 06/09/2018   LDLCALC 144 (H) 06/09/2018   TRIG 135.0 06/09/2018   CHOLHDL 4 06/09/2018   Contraception: She is currently breastfeeding her [age]-year-old.  She was prescribed Micronor by her gynecologist about a year ago, took it for 2 weeks. She has previously tried Pharmacist, hospital, a few years ago, but discontinued due to experiencing frequent periods every 17 days.   She does not want more children, she has contemplated tubal ligation, she is apprehensive about the anesthesia involved, and her husband is reluctant to undergo a vasectomy. Currently, she is using condoms for contraception.  BP mildly elevated, which she reports that it was high before first pregnancy. Usually 120's/80's. She does not check BP at home. Negative for severe/frequent headache, visual changes, chest pain, dyspnea, palpitation, focal weakness, or edema.  Lab Results  Component Value Date   NA 136 02/21/2022   CL 106 02/21/2022   K 3.5 02/21/2022   CO2 23 02/21/2022   BUN <5 (L) 02/21/2022   CREATININE 0.65 02/21/2022   GFRNONAA >60 02/21/2022   CALCIUM 8.6 (L) 02/21/2022   ALBUMIN 2.9 (L)  02/21/2022   GLUCOSE 83 02/21/2022   Review of Systems  Constitutional:  Negative for activity change, appetite change, fatigue and fever.  HENT:  Negative for mouth sores, nosebleeds and sore throat.   Respiratory:  Negative for cough and wheezing.   Gastrointestinal:  Negative for abdominal pain, nausea and vomiting.       Negative for changes in bowel habits.  Endocrine: Negative for cold intolerance and heat intolerance.  Genitourinary:  Negative for decreased urine volume and hematuria.  Skin:  Negative for rash.  Neurological:  Negative for syncope and facial asymmetry.  See other pertinent positives and negatives in HPI.  No current outpatient medications on file prior to visit.   No current facility-administered medications on file prior to visit.   Past Medical History:  Diagnosis Date   Anemia    Migraine    SVD (12/23) 10/12/2019   Trichomonal vaginitis during pregnancy    Vaginal delivery 02/18/2022   No Known Allergies  Family History  Problem Relation Age of Onset   Diabetes Mother    Multiple sclerosis Mother    Fibromyalgia Mother    Kidney disease Father    Hypertension Father    Diabetes Maternal Uncle    Diabetes Maternal Grandmother    Cancer Paternal Grandfather        lung   Autism Paternal Uncle        half-uncle ( husbands brother)    Hypertension Maternal Grandfather    Heart attack Maternal Grandfather    Stroke Maternal  Grandfather    Hypertension Paternal Grandmother    Stroke Paternal Grandmother    Heart disease Paternal Grandmother     Social History   Socioeconomic History   Marital status: Married    Spouse name: Blakesley Gehman   Number of children: Not on file   Years of education: Not on file   Highest education level: 12th grade  Occupational History   Not on file  Tobacco Use   Smoking status: Never   Smokeless tobacco: Never  Vaping Use   Vaping status: Never Used  Substance and Sexual Activity   Alcohol use: No    Drug use: No   Sexual activity: Not Currently    Birth control/protection: None  Other Topics Concern   Not on file  Social History Narrative   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    Married    Social Determinants of Health   Financial Resource Strain: Low Risk  (06/18/2023)   Overall Financial Resource Strain (CARDIA)    Difficulty of Paying Living Expenses: Not very hard  Food Insecurity: No Food Insecurity (06/18/2023)   Hunger Vital Sign    Worried About Running Out of Food in the Last Year: Never true    Ran Out of Food in the Last Year: Never true  Transportation Needs: No Transportation Needs (06/18/2023)   PRAPARE - Administrator, Civil Service (Medical): No    Lack of Transportation (Non-Medical): No  Physical Activity: Unknown (06/18/2023)   Exercise Vital Sign    Days of Exercise per Week: 0 days    Minutes of Exercise per Session: Not on file  Stress: No Stress Concern Present (06/18/2023)   [employer] of Occupational Health - Occupational Stress Questionnaire    Feeling of Stress : Only a little  Social Connections: Moderately Integrated (06/18/2023)   Social Connection and Isolation Panel [NHANES]    Frequency of Communication with Friends and Family: More than three times a week    Frequency of Social Gatherings with Friends and Family: Twice a week    Attends Religious Services: 1 to 4 times per year    Active Member of [employer] or Organizations: No    Attends Banker Meetings: Not on file    Marital Status: Married   Vitals:   06/18/23 1227  BP: 124/86  Pulse: 91  Resp: 12  Temp: 98.7 F (37.1 C)  SpO2: 99%   Body mass index is 22.24 kg/m.  Physical Exam Vitals and nursing note reviewed.  Constitutional:      General: She is not in acute distress.    Appearance: She is well-developed.  HENT:     Head: Normocephalic and atraumatic.     Mouth/Throat:     Mouth: Mucous membranes are moist.     Pharynx: Oropharynx is clear.  Eyes:      Conjunctiva/sclera: Conjunctivae normal.  Cardiovascular:     Rate and Rhythm: Normal rate and regular rhythm.     Pulses:          Dorsalis pedis pulses are 2+ on the right side and 2+ on the left side.     Heart sounds: No murmur heard. Pulmonary:     Effort: Pulmonary effort is normal. No respiratory distress.     Breath sounds: Normal breath sounds.  Abdominal:     Palpations: Abdomen is soft. There is no hepatomegaly or mass.     Tenderness: There is no abdominal tenderness.  Lymphadenopathy:  Cervical: No cervical adenopathy.  Skin:    General: Skin is warm.     Findings: No erythema or rash.  Neurological:     General: No focal deficit present.     Mental Status: She is alert and oriented to person, place, and time.     Cranial Nerves: No cranial nerve deficit.     Gait: Gait normal.  Psychiatric:        Mood and Affect: Mood and affect normal.   ASSESSMENT AND PLAN:  [name] was seen today for establish care.  Diagnoses and all orders for this visit:  Hyperlipidemia, unspecified hyperlipidemia type Assessment & Plan: For now non pharmacologic treatment recommended. FLP in 3-4 months.  Orders: -     Comprehensive metabolic panel; Future -     Lipid panel; Future  Encounter for initial prescription of contraceptive pills Assessment & Plan: We discussed birth control options. Breast feeding and DBP mildly elevated, so progesterone only contraception is a good options at this time. Some side effects discussed. She took Micronor in the past, agrees with trying again. Recommend arranging appt with her gynecologist to discuss other options like IUD,implant,or BTL.  Orders: -     Norethindrone; Take 1 tablet (0.35 mg total) by mouth daily.  Dispense: 84 tablet; Refill: 0  Elevated blood pressure reading Assessment & Plan: DBP's in the 80's. We discussed CV complications of elevated BP. Recommend monitoring BP at home. Caution with salt  intake.  Orders: -     Comprehensive metabolic panel; Future   Return in about 1 year (around 06/17/2024) for Fasting labs in 3-4 months., CPE.  Courtney [name] G. Swazil[place], MD  [facility]. [place] office.

## 2023-06-18 ENCOUNTER — Ambulatory Visit: Payer: Medicaid Other | Admitting: Family Medicine

## 2023-06-18 ENCOUNTER — Encounter: Payer: Self-pay | Admitting: Adult Health

## 2023-06-18 VITALS — BP 124/86 | HR 91 | Temp 98.7°F | Resp 12 | Ht 62.0 in | Wt 121.6 lb

## 2023-06-18 DIAGNOSIS — R030 Elevated blood-pressure reading, without diagnosis of hypertension: Secondary | ICD-10-CM

## 2023-06-18 DIAGNOSIS — Z30011 Encounter for initial prescription of contraceptive pills: Secondary | ICD-10-CM

## 2023-06-18 DIAGNOSIS — E785 Hyperlipidemia, unspecified: Secondary | ICD-10-CM | POA: Diagnosis not present

## 2023-06-18 HISTORY — DX: Elevated blood-pressure reading, without diagnosis of hypertension: R03.0

## 2023-06-18 MED ORDER — NORETHINDRONE 0.35 MG PO TABS: 1.0000 | ORAL_TABLET | Freq: Every day | ORAL | 0 refills | Status: DC

## 2023-06-18 NOTE — Assessment & Plan Note (Signed)
For now non pharmacologic treatment recommended. FLP in 3-4 months.

## 2023-06-18 NOTE — Patient Instructions (Addendum)
A few things to remember from today's visit:  Hyperlipidemia, unspecified hyperlipidemia type  Encounter for initial prescription of contraceptive pills - Plan: norethindrone (MICRONOR) 0.35 MG tablet  Elevated blood pressure reading Monitor blood pressure at home, goal under 130/80.  Fasting labs in 3-4 months. Micronor sent to your pharmacy. Arrange an appt with your gynecologist to discuss other birth control options, including surgical.  If you need refills for medications you take chronically, please call your pharmacy. Do not use My Chart to request refills or for acute issues that need immediate attention. If you send a my chart message, it may take a few days to be addressed, specially if I am not in the office.  Please be sure medication list is accurate. If a new problem present, please set up appointment sooner than planned today.

## 2023-06-18 NOTE — Assessment & Plan Note (Signed)
DBP's in the 80's. We discussed CV complications of elevated BP. Recommend monitoring BP at home. Caution with salt intake.

## 2023-06-18 NOTE — Assessment & Plan Note (Signed)
We discussed birth control options. Breast feeding and DBP mildly elevated, so progesterone only contraception is a good options at this time. Some side effects discussed. She took Micronor in the past, agrees with trying again. Recommend arranging appt with her gynecologist to discuss other options like IUD,implant,or BTL.

## 2023-08-02 ENCOUNTER — Telehealth: Payer: Self-pay | Admitting: Family Medicine

## 2023-08-02 NOTE — Telephone Encounter (Signed)
Per chart review patient seen in our office last 03/30/22. She was seen at [facility] medicine with Dr. Swazil[place] 06/18/23 who prescribed one pack of birth control pills. I called [name] and we discussed this. I instructed her to follow up with Dr. Elvis Coil office. Nancy Fetter

## 2023-08-02 NOTE — Telephone Encounter (Signed)
Patient had tried birth control pill but was not given refills, patient would like to stay with birth control pill, would like a nurse to call back to see if she needs to come in or can she get it refilled

## 2023-08-03 ENCOUNTER — Ambulatory Visit: Payer: Medicaid Other | Admitting: Certified Nurse Midwife

## 2023-08-27 ENCOUNTER — Other Ambulatory Visit: Payer: Self-pay | Admitting: Family Medicine

## 2023-08-27 DIAGNOSIS — Z30011 Encounter for initial prescription of contraceptive pills: Secondary | ICD-10-CM

## 2023-08-27 MED ORDER — NORETHINDRONE 0.35 MG PO TABS: 1.0000 | ORAL_TABLET | Freq: Every day | ORAL | 1 refills | Status: DC

## 2023-09-20 ENCOUNTER — Other Ambulatory Visit: Payer: Medicaid Other

## 2023-10-20 NOTE — L&D Delivery Note (Incomplete Revision)
 OB/GYN Faculty Practice Delivery Note  Courtney [name] is a 33 y.o. [id number] s/p SVD at [redacted]w[redacted]d. She was admitted for IOL iso gHTN.   ROM: 2h 62m with clear fluid GBS Status: negative Maximum Maternal Temperature: 98.5  Labor Progress: Initial SCE 5/80/-2. She then progressed to complete  Delivery: Called to room and patient was complete and pushing. Head delivered straight occiput anterior. nuchal cord absent. Shoulder and body delivered in usual fashion. Infant with spontaneous cry, placed on mother's abdomen, dried and stimulated. Cord clamped x 2 after 1-minute delay, and cut by patient!. Cord blood drawn. Placenta delivered spontaneously with gentle cord traction. Fundus firm with massage and Pitocin . Labia, perineum, vagina, and cervix inspected inspected with no lacerations.   Placenta:  spontaneous Intact Complications:none Lacerations: none EBL: 25mL Analgesia: Epidural  Newborn Data: Birth date:08/17/2023 Birth time:6:48 PM Gender:Female Living status:Living Apgars:9 ,9  Weight:           Brad Prey, Medical Student  08/17/2024, 7:23 PM   Attestation of Supervision of Resident: Evaluation and management procedures were performed by the learners: Medical Student under my supervision. I was immediately available for direct supervision, assistance and direction throughout this encounter.  I also confirm that I have verified the information documented in the resident's note, and that I have also personally reperformed the pertinent components of the physical exam and all of the medical decision making activities.  I have also made any necessary editorial changes.  Barabara Maier, DO [facility] Fellow Center for [employer]

## 2023-10-20 NOTE — L&D Delivery Note (Addendum)
 OB/GYN Faculty Practice Delivery Note  Courtney [name] is a 33 y.o. [id number] s/p SVD at [redacted]w[redacted]d. She was admitted for IOL.   ROM: 2h 68m with clear fluid GBS Status: negative Maximum Maternal Temperature: 98.5  Labor Progress: Initial SCE 5/80/-2. She then progressed to complete  Delivery: Called to room and patient was complete and pushing. Head delivered straight occiput anterior. nuchal cord absent. Shoulder and body delivered in usual fashion. Infant with spontaneous cry, placed on mother's abdomen, dried and stimulated. Cord clamped x 2 after 1-minute delay, and cut by mother. Cord blood drawn. Placenta delivered spontaneously with gentle cord traction. Fundus firm with massage and Pitocin . Labia, perineum, vagina, and cervix inspected inspected with no lacerations.   Placenta:  spontaneous Intact Complications:none Lacerations: none EBL: 25mL Analgesia: Epidural  Newborn Data: Birth date:08/17/2023 Birth time:6:48 PM Gender:Female Living status:Living Apgars:9 ,9  Weight:           Brad Prey, Medical Student  08/17/2024, 7:23 PM

## 2023-11-12 ENCOUNTER — Ambulatory Visit: Payer: Medicaid Other | Admitting: Family Medicine

## 2023-11-12 ENCOUNTER — Other Ambulatory Visit (HOSPITAL_COMMUNITY)
Admission: RE | Admit: 2023-11-12 | Discharge: 2023-11-12 | Disposition: A | Discharge status: Home / Self Care | Payer: Medicaid Other | Source: Ambulatory Visit | Attending: Family Medicine | Admitting: Family Medicine

## 2023-11-12 VITALS — BP 126/80 | HR 100 | Resp 12 | Ht 62.0 in | Wt 128.4 lb

## 2023-11-12 DIAGNOSIS — N898 Other specified noninflammatory disorders of vagina: Secondary | ICD-10-CM | POA: Insufficient documentation

## 2023-11-12 DIAGNOSIS — E785 Hyperlipidemia, unspecified: Secondary | ICD-10-CM

## 2023-11-12 DIAGNOSIS — R030 Elevated blood-pressure reading, without diagnosis of hypertension: Secondary | ICD-10-CM

## 2023-11-12 DIAGNOSIS — Z113 Encounter for screening for infections with a predominantly sexual mode of transmission: Secondary | ICD-10-CM

## 2023-11-12 LAB — COMPREHENSIVE METABOLIC PANEL
ALT: 13 U/L (ref 0–35)
AST: 22 U/L (ref 0–37)
Albumin: 4.7 g/dL (ref 3.5–5.2)
Alkaline Phosphatase: 82 U/L (ref 39–117)
BUN: 10 mg/dL (ref 6–23)
CO2: 23 meq/L (ref 19–32)
Calcium: 9.1 mg/dL (ref 8.4–10.5)
Chloride: 103 meq/L (ref 96–112)
Creatinine, Ser: 0.7 mg/dL (ref 0.40–1.20)
GFR: 115.31 mL/min (ref >60.00)
Glucose, Bld: 90 mg/dL (ref 70–99)
Potassium: 3.6 meq/L (ref 3.5–5.1)
Sodium: 135 meq/L (ref 135–145)
Total Bilirubin: 0.7 mg/dL (ref 0.2–1.2)
Total Protein: 8.4 g/dL — ABNORMAL HIGH (ref 6.0–8.3)

## 2023-11-12 LAB — LIPID PANEL
Cholesterol: 225 mg/dL — ABNORMAL HIGH (ref 0–200)
HDL: 69.5 mg/dL (ref >39.00)
LDL Cholesterol: 145 mg/dL — ABNORMAL HIGH (ref 0–99)
NonHDL: 155.31
Total CHOL/HDL Ratio: 3
Triglycerides: 51 mg/dL (ref 0.0–149.0)
VLDL: 10.2 mg/dL (ref 0.0–40.0)

## 2023-11-12 NOTE — Patient Instructions (Addendum)
A few things to remember from today's visit:  Vaginal discharge - Plan: Cervicovaginal ancillary only( [place])  Screen for STD (sexually transmitted disease) - Plan: Cervicovaginal ancillary only( ), RPR, HIV Antibody (routine testing w rflx)  So as we decide we will hold on starting Metronidazole. Wear condoms. If medication is started, you may need to stop breastfeeding while on med.  Do not use My Chart to request refills or for acute issues that need immediate attention. If you send a my chart message, it may take a few days to be addressed, specially if I am not in the office.  Please be sure medication list is accurate. If a new problem present, please set up appointment sooner than planned today.

## 2023-11-12 NOTE — Progress Notes (Unsigned)
ACUTE VISIT Chief Complaint  Patient presents with   Vaginal Discharge    X 2 weeks    HPI: Ms.Anastacia Berton is a 33 y.o. female with a PMHx significant for migraine, gestational HTN, HLD, and anxiety, who is here today complaining of vaginal itching and discharge for about 3 weeks. Improved temporarily with OTC Monistat but symptoms back to baseline after stopping treatment.  Vaginal Discharge The patient's primary symptoms include genital itching, a genital odor and vaginal discharge. The patient's pertinent negatives include no genital lesions, genital rash, missed menses, pelvic pain or vaginal bleeding. The current episode started 1 to 4 weeks ago. The problem has been unchanged. The pain is mild. The problem affects both sides. She is not pregnant. Pertinent negatives include no abdominal pain, anorexia, back pain, chills, discolored urine, dysuria, fever, flank pain, frequency, headaches, hematuria, joint pain or sore throat. The vaginal discharge was milky, thin and malodorous. There has been no bleeding. She has not been passing clots. She has not been passing tissue. The symptoms are aggravated by intercourse. She has tried antifungals for the symptoms. The treatment provided mild relief. She is sexually active. It is unknown whether or not her partner has an STD. She uses oral contraceptives for contraception.   She has a sex partner, her husband, but recently found out her husband has an affair.  HLD on non pharmacologist treatment. 5 years ago TC 236, TG 51,HDL 69, and LDL 145.  Review of Systems  Constitutional:  Negative for chills and fever.  HENT:  Negative for mouth sores and sore throat.   Respiratory:  Negative for shortness of breath.   Cardiovascular:  Negative for chest pain and palpitations.  Gastrointestinal:  Negative for abdominal pain and anorexia.  Genitourinary:  Positive for vaginal discharge. Negative for dysuria, flank pain, frequency, hematuria, missed  menses and pelvic pain.  Musculoskeletal:  Negative for back pain and joint pain.  Neurological:  Negative for headaches.  See other pertinent positives and negatives in HPI.  Current Outpatient Medications on File Prior to Visit  Medication Sig Dispense Refill   norethindrone (MICRONOR) 0.35 MG tablet Take 1 tablet (0.35 mg total) by mouth daily. 84 tablet 1   No current facility-administered medications on file prior to visit.    Past Medical History:  Diagnosis Date   Anemia    Migraine    SVD (12/23) 10/12/2019   Trichomonal vaginitis during pregnancy    Vaginal delivery 02/18/2022   No Known Allergies  Social History   Socioeconomic History   Marital status: Married    Spouse name: Layah Skousen   Number of children: Not on file   Years of education: Not on file   Highest education level: 12th grade  Occupational History   Not on file  Tobacco Use   Smoking status: Never   Smokeless tobacco: Never  Vaping Use   Vaping status: Never Used  Substance and Sexual Activity   Alcohol use: No   Drug use: No   Sexual activity: Not Currently    Birth control/protection: None  Other Topics Concern   Not on file  Social History Narrative   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    Married    Social Drivers of Corporate investment banker Strain: Low Risk  (11/12/2023)   Overall Financial Resource Strain (CARDIA)    Difficulty of Paying Living Expenses: Not hard at all  Food Insecurity: No Food Insecurity (11/12/2023)   Hunger Vital  Sign    Worried About Programme researcher, broadcasting/film/video in the Last Year: Never true    Ran Out of Food in the Last Year: Never true  Transportation Needs: No Transportation Needs (11/12/2023)   PRAPARE - Administrator, Civil Service (Medical): No    Lack of Transportation (Non-Medical): No  Physical Activity: Unknown (11/12/2023)   Exercise Vital Sign    Days of Exercise per Week: 0 days    Minutes of Exercise per Session: Not on file  Stress: Stress  Concern Present (11/12/2023)   [employer] of Occupational Health - Occupational Stress Questionnaire    Feeling of Stress : Rather much  Social Connections: Moderately Integrated (11/12/2023)   Social Connection and Isolation Panel [NHANES]    Frequency of Communication with Friends and Family: More than three times a week    Frequency of Social Gatherings with Friends and Family: Once a week    Attends Religious Services: 1 to 4 times per year    Active Member of [employer] or Organizations: No    Attends Banker Meetings: Not on file    Marital Status: Married   Vitals:   11/12/23 0918  BP: 126/80  Pulse: 100  Resp: 12  SpO2: 99%   Body mass index is 23.48 kg/m.  Physical Exam Vitals and nursing note reviewed. Exam conducted with a chaperone present.  Constitutional:      General: She is not in acute distress.    Appearance: She is well-developed.  HENT:     Head: Normocephalic and atraumatic.  Eyes:     Conjunctiva/sclera: Conjunctivae normal.  Pulmonary:     Effort: Pulmonary effort is normal. No respiratory distress.  Abdominal:     Palpations: Abdomen is soft. There is no mass.     Tenderness: There is no abdominal tenderness.  Genitourinary:    Exam position: Lithotomy position.     Labia:        Right: No rash, tenderness, lesion or injury.        Left: No rash, tenderness, lesion or injury.      Vagina: Vaginal discharge present. No erythema, tenderness or bleeding.     Cervix: Discharge and erythema present. No cervical motion tenderness, friability, lesion or cervical bleeding.     Comments: Copious amount of whitish vaginal discharge.  Skin:    General: Skin is warm.     Findings: No erythema or rash.  Neurological:     General: No focal deficit present.     Mental Status: She is alert and oriented to person, place, and time.     Gait: Gait normal.  Psychiatric:        Mood and Affect: Mood and affect normal.   ASSESSMENT AND PLAN:  Ms.  Demchak was seen today for vaginal itching and discharge.  Lab Results  Component Value Date   CHOL 225 (H) 11/12/2023   HDL 69.50 11/12/2023   LDLCALC 145 (H) 11/12/2023   TRIG 51.0 11/12/2023   CHOLHDL 3 11/12/2023   Lab Results  Component Value Date   NA 135 11/12/2023   CL 103 11/12/2023   K 3.6 11/12/2023   CO2 23 11/12/2023   BUN 10 11/12/2023   CREATININE 0.70 11/12/2023   GFR 115.31 11/12/2023   CALCIUM 9.1 11/12/2023   ALBUMIN 4.7 11/12/2023   GLUCOSE 90 11/12/2023   Lab Results  Component Value Date   ALT 13 11/12/2023   AST 22 11/12/2023   ALKPHOS  82 11/12/2023   BILITOT 0.7 11/12/2023   Vaginal discharge We discussed possible etiologies, including BV, trichomoniasis, and candidiasis. We talked about the possibility of starting oral metronidazole empirically for possible trich infection, she prefers to hold on treatment for now. She is breastfeeding, she will need to stop if Metronidazole is recommended. Stressed the importance of wearing condoms for STD prevention. Further recommendation will be given according to lab results.  -     Cervicovaginal ancillary only; Future  Screen for STD (sexually transmitted disease) -     Cervicovaginal ancillary only; Future -     RPR; Future -     HIV Antibody (routine testing w rflx); Future  Hyperlipidemia, unspecified hyperlipidemia type  Continue non pharmacologic treatment. Further recommendations according to lipid panel result.  I spent a total of 32 minutes in both face to face and non face to face activities for this visit on the date of this encounter. During this time history was obtained and documented, examination was performed,and assessment/plan discussed.  Return if symptoms worsen or fail to improve.  Bahja Bence G. [place], MD  [facility]. [place] office.

## 2023-11-13 ENCOUNTER — Encounter: Payer: Self-pay | Admitting: Family Medicine

## 2023-11-13 LAB — HIV ANTIBODY (ROUTINE TESTING W REFLEX): HIV 1&2 Ab, 4th Generation: NONREACTIVE

## 2023-11-13 LAB — RPR: RPR Ser Ql: NONREACTIVE

## 2023-11-14 LAB — CERVICOVAGINAL ANCILLARY ONLY
Bacterial Vaginitis (gardnerella): POSITIVE — AB
Candida Glabrata: NEGATIVE
Candida Vaginitis: NEGATIVE
Chlamydia: NEGATIVE
Comment: NEGATIVE
Comment: NEGATIVE
Comment: NEGATIVE
Comment: NEGATIVE
Comment: NEGATIVE
Comment: NORMAL
Neisseria Gonorrhea: NEGATIVE
Trichomonas: POSITIVE — AB

## 2023-11-15 ENCOUNTER — Other Ambulatory Visit: Payer: Self-pay | Admitting: Family Medicine

## 2023-11-15 DIAGNOSIS — A5901 Trichomonal vulvovaginitis: Secondary | ICD-10-CM

## 2023-11-15 MED ORDER — METRONIDAZOLE 500 MG PO TABS: 500.0000 mg | ORAL_TABLET | Freq: Two times a day (BID) | ORAL | 0 refills | Status: AC

## 2023-12-29 ENCOUNTER — Ambulatory Visit: Admitting: *Deleted

## 2023-12-29 DIAGNOSIS — Z3200 Encounter for pregnancy test, result unknown: Secondary | ICD-10-CM

## 2023-12-29 DIAGNOSIS — Z3201 Encounter for pregnancy test, result positive: Secondary | ICD-10-CM

## 2023-12-29 DIAGNOSIS — O3680X0 Pregnancy with inconclusive fetal viability, not applicable or unspecified: Secondary | ICD-10-CM

## 2023-12-29 LAB — POCT PREGNANCY, URINE: Preg Test, Ur: POSITIVE — AB

## 2023-12-29 NOTE — Progress Notes (Signed)
 Pt submitted urine for pregnancy test which is positive. I called pt and informed her of results. She reports sure LMP 10/30/23 which yields EDD 08/05/24, now [redacted]w[redacted]d. Pt has had some light spotting. She denies heavy vaginal bleeding or abdominal pain. Pt was advised to go to [facility] if she develops these symptoms. [place] for viability and to confirm dating was scheduled on 3/13 @ 10:15.  She will be scheduled for prenatal care in this office. Pt reports that she was prescribed antibiotic by her PCP in January however did not take it as she lost the prescription. Per chart review, pt was positive for Trich and BV on 1/24 and Metronidazole was prescribed on 1/27. Pt states that her partner did receive treatment. I advised pt to contact her PCP for refill of medication and also that her partner should be re-tested as he will likely require re-treatment. They should abstain from intercourse for 2 weeks after they both have been treated. She voiced understanding of all instructions and information given.

## 2023-12-30 ENCOUNTER — Encounter: Payer: Self-pay | Admitting: Family Medicine

## 2023-12-30 ENCOUNTER — Other Ambulatory Visit: Payer: Self-pay

## 2023-12-30 ENCOUNTER — Telehealth: Payer: Self-pay | Admitting: Lactation Services

## 2023-12-30 ENCOUNTER — Ambulatory Visit

## 2023-12-30 ENCOUNTER — Ambulatory Visit: Admitting: General Practice

## 2023-12-30 DIAGNOSIS — O3680X0 Pregnancy with inconclusive fetal viability, not applicable or unspecified: Secondary | ICD-10-CM | POA: Diagnosis not present

## 2023-12-30 DIAGNOSIS — A5901 Trichomonal vulvovaginitis: Secondary | ICD-10-CM

## 2023-12-30 DIAGNOSIS — Z3A01 Less than 8 weeks gestation of pregnancy: Secondary | ICD-10-CM

## 2023-12-30 DIAGNOSIS — Z3491 Encounter for supervision of normal pregnancy, unspecified, first trimester: Secondary | ICD-10-CM

## 2023-12-30 DIAGNOSIS — O219 Vomiting of pregnancy, unspecified: Secondary | ICD-10-CM

## 2023-12-30 MED ORDER — DOXYLAMINE-PYRIDOXINE 10-10 MG PO TBEC: DELAYED_RELEASE_TABLET | ORAL | 1 refills | Status: DC

## 2023-12-30 MED ORDER — METRONIDAZOLE 500 MG PO TABS: 500.0000 mg | ORAL_TABLET | Freq: Two times a day (BID) | ORAL | 0 refills | Status: DC

## 2023-12-30 NOTE — Telephone Encounter (Signed)
 Received PA request from [facility] for Diclegis. Called and spoke with Pharmacy and when run as brand it was approved. They will notify patient when ready.

## 2023-12-30 NOTE — Progress Notes (Signed)
 Patient in office today for viability ultrasound. Reviewed with Dr Crissie Reese who states + gestational sac & yolk sac found but no fetal pole yet. Gestational sac measures 5 weeks- scheduled patient for repeat ultrasound 3/25. Rx for metronidazole given due to history of + trichomonas in January & patient wasn't treated. Patient also requests Diclegis for nausea. Rx sent to pharmacy as well. Patient will return to office in 2 weeks.   Chase Caller RN BSN 12/30/23

## 2024-01-04 NOTE — Progress Notes (Signed)
 Patient has been scheduled on 01/11/24.  B'Aisha, CMA

## 2024-01-11 ENCOUNTER — Ambulatory Visit

## 2024-01-11 DIAGNOSIS — O3680X0 Pregnancy with inconclusive fetal viability, not applicable or unspecified: Secondary | ICD-10-CM

## 2024-01-11 DIAGNOSIS — Z3A01 Less than 8 weeks gestation of pregnancy: Secondary | ICD-10-CM | POA: Diagnosis not present

## 2024-01-11 DIAGNOSIS — Z3491 Encounter for supervision of normal pregnancy, unspecified, first trimester: Secondary | ICD-10-CM | POA: Diagnosis not present

## 2024-01-12 ENCOUNTER — Telehealth

## 2024-01-12 DIAGNOSIS — Z3480 Encounter for supervision of other normal pregnancy, unspecified trimester: Secondary | ICD-10-CM | POA: Insufficient documentation

## 2024-01-12 DIAGNOSIS — Z3A01 Less than 8 weeks gestation of pregnancy: Secondary | ICD-10-CM

## 2024-01-12 DIAGNOSIS — Z3481 Encounter for supervision of other normal pregnancy, first trimester: Secondary | ICD-10-CM

## 2024-01-12 DIAGNOSIS — Z3490 Encounter for supervision of normal pregnancy, unspecified, unspecified trimester: Secondary | ICD-10-CM | POA: Insufficient documentation

## 2024-01-12 DIAGNOSIS — O0990 Supervision of high risk pregnancy, unspecified, unspecified trimester: Secondary | ICD-10-CM | POA: Insufficient documentation

## 2024-01-12 NOTE — Progress Notes (Signed)
 New OB Intake  I connected with Courtney [name]  on 01/12/24 at  2:15 PM EDT by MyChart Video Visit and verified that I am speaking with the correct person using two identifiers. Nurse is located at [facility] and pt is located at home.  I discussed the limitations, risks, security and privacy concerns of performing an evaluation and management service by telephone and the availability of in person appointments. I also discussed with the patient that there may be a patient responsible charge related to this service. The patient expressed understanding and agreed to proceed.  I explained I am completing New OB Intake today. We discussed EDD of Not found.. Pt is [id number]. I reviewed her allergies, medications and Medical/Surgical/OB history.    Patient Active Problem List   Diagnosis Date Noted   Supervision of other normal pregnancy, antepartum 01/12/2024   Hyperlipidemia 06/18/2023   Elevated blood pressure reading 06/18/2023   Postpartum care following vaginal delivery 03/30/2022   Contraception management 03/30/2022   Pap smear for cervical cancer screening 03/30/2022   Gestational hypertension 02/18/2022   Anxiety 10/11/2019   Migraine hx 10/11/2019   History of prior pregnancy with IUGR newborn 10/11/2019    Concerns addressed today  Delivery Plans Plans to deliver at [facility]. Discussed the nature of our practice with multiple providers including residents and students. Due to the size of the practice, the delivering provider may not be the same as those providing prenatal care.   Patient is not interested in water birth. Offered upcoming OB visit with CNM to discuss further.  MyChart/Babyscripts MyChart access verified. I explained pt will have some visits in office and some virtually. Babyscripts instructions given and order placed. Patient verifies receipt of registration text/e-mail. Account successfully created and app downloaded. If patient is a candidate for Optimized  scheduling, add to sticky note.   Blood Pressure Cuff/Weight Scale Pt has own BP Cuff, Explained after first prenatal appt pt will check weekly and document in Babyscripts. Patient does have weight scale.  Anatomy US Explained first scheduled [place] will be around 19 weeks. Anatomy US scheduled for 04/03/24 at 0100p.  Is patient a CenteringPregnancy candidate?  Accepted   If accepted,    Is patient a Mom+Baby Combined Care candidate?  Not a candidate   If accepted, confirm patient does not intend to move from the area for at least 12 months, then notify Mom+Baby staff  Interested in [place]? If yes, send referral and doula dot phrase.   Is patient a candidate for Babyscripts Optimization?    First visit review I reviewed new OB appt with patient. Explained pt will be seen by Clement Sayres PA-C, at first visit. Discussed Avelina Laine genetic screening with patient. Needs Panorama and Horizon.. Routine prenatal labs  needed at [facility] OB visit.    Last Pap Diagnosis  Date Value Ref Range Status  03/30/2022   Final   - Negative for intraepithelial lesion or malignancy (NILM)    Henrietta Dine, CMA 01/12/2024  3:15 PM

## 2024-01-15 ENCOUNTER — Encounter: Payer: Self-pay | Admitting: Family Medicine

## 2024-01-21 ENCOUNTER — Encounter: Admitting: Obstetrics & Gynecology

## 2024-01-24 ENCOUNTER — Encounter: Payer: Self-pay | Admitting: *Deleted

## 2024-01-30 NOTE — Progress Notes (Unsigned)
 PRENATAL VISIT NOTE  Subjective:  Courtney [name] is a 33 y.o. N5A2130 at [redacted]w[redacted]d being seen today for her first prenatal visit for this pregnancy.  She is currently monitored for the following issues for this {Blank single:19197::"high-risk","low-risk"} pregnancy and has Anxiety; Migraine hx; History of prior pregnancy with IUGR newborn; Gestational hypertension; Postpartum care following vaginal delivery; Contraception management; Pap smear for cervical cancer screening; Hyperlipidemia; Elevated blood pressure reading; and Supervision of other normal pregnancy, antepartum on their problem list.  Patient reports {sx:14538}.   .  .   . Denies leaking of fluid.   She is planning to {Blank single:19197::"breastfeed","bottle feed"}. Desires *** for contraception.   Last pap 03/30/22-NILM  The following portions of the patient's history were reviewed and updated as appropriate: allergies, current medications, past family history, past medical history, past social history, past surgical history and problem list.   Objective:  There were no vitals filed for this visit.  Fetal Status:           General:  Alert, oriented and cooperative. Patient is in no acute distress.  Skin: Skin is warm and dry. No rash noted.   Cardiovascular: Normal heart rate and rhythm noted  Respiratory: Normal respiratory effort, no problems with respiration noted. Clear to auscultation.   Abdomen: Soft, gravid, appropriate for gestational age. Normal bowel sounds. Non-tender.       Pelvic: Cervical exam deferred       Normal cervical contour, no lesions, no bleeding following pap, normal discharge  Extremities: Normal range of motion.     Mental Status: Normal mood and affect. Normal behavior. Normal judgment and thought content.    Indications for ASA therapy (per uptodate) One of the following: Previous pregnancy with preeclampsia, especially early onset and with an adverse outcome {yes/no:20286} Multifetal  gestation No Chronic hypertension {yes/no:20286} Type 1 or 2 diabetes mellitus {yes/no:20286} Chronic kidney disease {yes/no:20286} Autoimmune disease (antiphospholipid syndrome, systemic lupus erythematosus) {yes/no:20286}  Two or more of the following: Nulliparity No Obesity (body mass index >30 kg/m2) No Family history of preeclampsia in mother or sister {yes/no:20286} Age >=35 years No Sociodemographic characteristics (African American race, low socioeconomic level) Yes Personal risk factors (eg, previous pregnancy with low birth weight or small for gestational age infant, previous adverse pregnancy outcome [eg, stillbirth], interval >10 years between pregnancies) {yes/no:20286}   Assessment and Plan:  Pregnancy: [id number] at [redacted]w[redacted]d 1. Supervision of other normal pregnancy, antepartum (Primary) Encounter for supervision of normal pregnancy, antepartum, unspecified gravidity Initial labs drawn. Continue prenatal vitamins. Genetic Screening discussed: NIPS, carrier screening and AFP *** Ultrasound discussed; fetal anatomic survey: *** Problem list reviewed and updated. Reviewed Brx optimized schedule, patient agreeable The nature of [facility] Faculty Practice with multiple MDs and other Advanced Practice Providers was explained to patient; also emphasized that residents, students are part of our team. Routine obstetric precautions reviewed.   2. [redacted] weeks gestation of pregnancy Anticipatory guidance about next visits/weeks of pregnancy given.   Preterm labor/first trimester warning symptoms and general obstetric precautions including but not limited to vaginal bleeding, contractions, leaking of fluid and fetal movement were reviewed in detail with the patient. Please refer to After Visit Summary for other counseling recommendations.   No follow-ups on file.  Future Appointments  Date Time Provider Department Center  01/31/2024  9:55 AM Lucille Witts E, PA-C  [facility]  03/17/2024  9:00 AM CENTERING PROVIDER [facility]  04/03/2024  1:00 PM [facility] PROVIDER 1 [facility]  04/03/2024  1:30 PM [facility] US3 [facility]  04/14/2024  9:00 AM CENTERING PROVIDER [facility]  05/12/2024  9:00 AM CENTERING PROVIDER [facility]  05/26/2024  9:00 AM CENTERING PROVIDER [facility]  06/09/2024  9:00 AM CENTERING PROVIDER [facility]  06/20/2024  8:30 AM [place], Betty G, MD [facility]  06/23/2024  9:00 AM CENTERING PROVIDER [facility]  07/07/2024  9:00 AM CENTERING PROVIDER [facility]  07/21/2024  9:00 AM CENTERING PROVIDER [facility]  07/28/2024  9:00 AM CENTERING PROVIDER [facility]  08/04/2024  9:00 AM CENTERING PROVIDER [facility]    Luevenia Saha, PA-C

## 2024-01-31 ENCOUNTER — Other Ambulatory Visit (HOSPITAL_COMMUNITY)
Admission: RE | Admit: 2024-01-31 | Discharge: 2024-01-31 | Disposition: A | Discharge status: Home / Self Care | Source: Ambulatory Visit | Attending: Physician Assistant | Admitting: Physician Assistant

## 2024-01-31 ENCOUNTER — Ambulatory Visit: Admitting: Physician Assistant

## 2024-01-31 ENCOUNTER — Other Ambulatory Visit: Payer: Self-pay

## 2024-01-31 ENCOUNTER — Encounter: Payer: Self-pay | Admitting: Physician Assistant

## 2024-01-31 VITALS — BP 139/87 | HR 98 | Wt 131.3 lb

## 2024-01-31 DIAGNOSIS — Z31430 Encounter of female for testing for genetic disease carrier status for procreative management: Secondary | ICD-10-CM | POA: Diagnosis not present

## 2024-01-31 DIAGNOSIS — Z3480 Encounter for supervision of other normal pregnancy, unspecified trimester: Secondary | ICD-10-CM

## 2024-01-31 DIAGNOSIS — Z8759 Personal history of other complications of pregnancy, childbirth and the puerperium: Secondary | ICD-10-CM

## 2024-01-31 DIAGNOSIS — O219 Vomiting of pregnancy, unspecified: Secondary | ICD-10-CM

## 2024-01-31 DIAGNOSIS — Z3A10 10 weeks gestation of pregnancy: Secondary | ICD-10-CM | POA: Diagnosis not present

## 2024-01-31 MED ORDER — ASPIRIN 81 MG PO TBEC: 81.0000 mg | DELAYED_RELEASE_TABLET | Freq: Every day | ORAL | 12 refills | Status: DC

## 2024-01-31 MED ORDER — DOXYLAMINE-PYRIDOXINE 10-10 MG PO TBEC: DELAYED_RELEASE_TABLET | ORAL | 1 refills | Status: DC

## 2024-02-01 LAB — COMPREHENSIVE METABOLIC PANEL WITH GFR
ALT: 11 IU/L (ref 0–32)
AST: 19 IU/L (ref 0–40)
Albumin: 4.2 g/dL (ref 3.9–4.9)
Alkaline Phosphatase: 69 IU/L (ref 44–121)
BUN/Creatinine Ratio: 16 (ref 9–23)
BUN: 9 mg/dL (ref 6–20)
Bilirubin Total: <0.2 mg/dL (ref 0.0–1.2)
CO2: 18 mmol/L — ABNORMAL LOW (ref 20–29)
Calcium: 9.2 mg/dL (ref 8.7–10.2)
Chloride: 102 mmol/L (ref 96–106)
Creatinine, Ser: 0.58 mg/dL (ref 0.57–1.00)
Globulin, Total: 3.5 g/dL (ref 1.5–4.5)
Glucose: 97 mg/dL (ref 70–99)
Potassium: 4.1 mmol/L (ref 3.5–5.2)
Sodium: 133 mmol/L — ABNORMAL LOW (ref 134–144)
Total Protein: 7.7 g/dL (ref 6.0–8.5)
eGFR: 124 mL/min/{1.73_m2} (ref >59)

## 2024-02-01 LAB — CBC/D/PLT+RPR+RH+ABO+RUBIGG...
Antibody Screen: NEGATIVE
Basophils Absolute: 0 10*3/uL (ref 0.0–0.2)
Basos: 0 %
EOS (ABSOLUTE): 0.1 10*3/uL (ref 0.0–0.4)
Eos: 1 %
HCV Ab: NONREACTIVE
HIV Screen 4th Generation wRfx: NONREACTIVE
Hematocrit: 33.1 % — ABNORMAL LOW (ref 34.0–46.6)
Hemoglobin: 10.3 g/dL — ABNORMAL LOW (ref 11.1–15.9)
Hepatitis B Surface Ag: NEGATIVE
Immature Grans (Abs): 0 10*3/uL (ref 0.0–0.1)
Immature Granulocytes: 0 %
Lymphocytes Absolute: 1.9 10*3/uL (ref 0.7–3.1)
Lymphs: 29 %
MCH: 23.7 pg — ABNORMAL LOW (ref 26.6–33.0)
MCHC: 31.1 g/dL — ABNORMAL LOW (ref 31.5–35.7)
MCV: 76 fL — ABNORMAL LOW (ref 79–97)
Monocytes Absolute: 0.6 10*3/uL (ref 0.1–0.9)
Monocytes: 8 %
Neutrophils Absolute: 4.2 10*3/uL (ref 1.4–7.0)
Neutrophils: 62 %
Platelets: 382 10*3/uL (ref 150–450)
RBC: 4.34 x10E6/uL (ref 3.77–5.28)
RDW: 17.2 % — ABNORMAL HIGH (ref 11.7–15.4)
RPR Ser Ql: NONREACTIVE
Rh Factor: POSITIVE
Rubella Antibodies, IGG: 10 {index} (ref >0.99)
WBC: 6.8 10*3/uL (ref 3.4–10.8)

## 2024-02-01 LAB — GC/CHLAMYDIA PROBE AMP (~~LOC~~) NOT AT ARMC
Chlamydia: NEGATIVE
Comment: NEGATIVE
Comment: NORMAL
Neisseria Gonorrhea: NEGATIVE

## 2024-02-01 LAB — HEMOGLOBIN A1C
Est. average glucose Bld gHb Est-mCnc: 114 mg/dL
Hgb A1c MFr Bld: 5.6 % (ref 4.8–5.6)

## 2024-02-01 LAB — PROTEIN / CREATININE RATIO, URINE
Creatinine, Urine: 77.8 mg/dL
Protein, Ur: 8.1 mg/dL
Protein/Creat Ratio: 104 mg/g{creat} (ref 0–200)

## 2024-02-01 LAB — HCV INTERPRETATION

## 2024-02-01 LAB — TSH: TSH: 0.029 u[IU]/mL — ABNORMAL LOW (ref 0.450–4.500)

## 2024-02-02 LAB — URINE CULTURE, OB REFLEX

## 2024-02-02 LAB — CULTURE, OB URINE

## 2024-02-04 ENCOUNTER — Other Ambulatory Visit: Payer: Self-pay | Admitting: Physician Assistant

## 2024-02-04 DIAGNOSIS — O99011 Anemia complicating pregnancy, first trimester: Secondary | ICD-10-CM

## 2024-02-04 DIAGNOSIS — E0590 Thyrotoxicosis, unspecified without thyrotoxic crisis or storm: Secondary | ICD-10-CM

## 2024-02-04 MED ORDER — FERROUS GLUCONATE 324 (38 FE) MG PO TABS: 324.0000 mg | ORAL_TABLET | ORAL | 2 refills | Status: DC

## 2024-02-05 LAB — PANORAMA PRENATAL TEST FULL PANEL:PANORAMA TEST PLUS 5 ADDITIONAL MICRODELETIONS: FETAL FRACTION: 15.3

## 2024-02-07 ENCOUNTER — Telehealth: Payer: Self-pay

## 2024-02-07 LAB — HORIZON CUSTOM: REPORT SUMMARY: NEGATIVE

## 2024-02-07 NOTE — Telephone Encounter (Addendum)
 Attempted to call patient regarding physician's request to come back for lab draw for T3, T4, throtropin receptors. Left VM to call our office back.   Moira Andrews, RN    ----- Message from Luevenia Saha sent at 02/04/2024  1:25 PM EDT ----- Please call Mrs. Whittley and get her scheduled for a lab visit for thyroid  labs before her next prenatal visit.   Thank you,  [facility] [place]

## 2024-02-08 NOTE — Telephone Encounter (Signed)
 Patient scheduled for 02/09/24.  Indalecio Malmstrom,RN

## 2024-02-09 ENCOUNTER — Other Ambulatory Visit

## 2024-02-09 DIAGNOSIS — E0590 Thyrotoxicosis, unspecified without thyrotoxic crisis or storm: Secondary | ICD-10-CM

## 2024-02-10 LAB — T4, FREE: Free T4: 1.15 ng/dL (ref 0.82–1.77)

## 2024-02-10 LAB — THYROTROPIN RECEPTOR AUTOABS: Thyrotropin Receptor Ab: <1.1 IU/L (ref 0.00–1.75)

## 2024-02-10 LAB — T3, FREE: T3, Free: 2.9 pg/mL (ref 2.0–4.4)

## 2024-02-15 ENCOUNTER — Encounter: Payer: Self-pay | Admitting: Physician Assistant

## 2024-02-15 ENCOUNTER — Other Ambulatory Visit: Payer: Self-pay | Admitting: Physician Assistant

## 2024-02-15 DIAGNOSIS — E038 Other specified hypothyroidism: Secondary | ICD-10-CM

## 2024-03-08 ENCOUNTER — Telehealth: Payer: Self-pay | Admitting: *Deleted

## 2024-03-08 NOTE — Telephone Encounter (Signed)
 I called Courtney [name] to welcome to [facility]regnancy prenatal care and review appointment 03/17/24 and information . She voices understanding. Courtney [name]

## 2024-03-16 ENCOUNTER — Other Ambulatory Visit: Payer: Self-pay | Admitting: Family

## 2024-03-17 ENCOUNTER — Ambulatory Visit: Admitting: Family

## 2024-03-17 VITALS — BP 130/87 | HR 96 | Wt 139.6 lb

## 2024-03-17 DIAGNOSIS — Z3480 Encounter for supervision of other normal pregnancy, unspecified trimester: Secondary | ICD-10-CM

## 2024-03-17 DIAGNOSIS — Z3A16 16 weeks gestation of pregnancy: Secondary | ICD-10-CM

## 2024-03-17 DIAGNOSIS — E0590 Thyrotoxicosis, unspecified without thyrotoxic crisis or storm: Secondary | ICD-10-CM | POA: Diagnosis not present

## 2024-03-17 DIAGNOSIS — O99342 Other mental disorders complicating pregnancy, second trimester: Secondary | ICD-10-CM

## 2024-03-17 DIAGNOSIS — F419 Anxiety disorder, unspecified: Secondary | ICD-10-CM

## 2024-03-17 DIAGNOSIS — O99282 Endocrine, nutritional and metabolic diseases complicating pregnancy, second trimester: Secondary | ICD-10-CM

## 2024-03-17 NOTE — Progress Notes (Signed)
  [facility] PRENATAL VISIT NOTE- Centering Pregnancy Group #21  Subjective:  Courtney [name] is a 33 y.o. [id number] at [redacted]w[redacted]d being seen today for for session #1 for Centering Pregnancy..  She is currently monitored for the following issues for this low-risk pregnancy and has Anxiety; Migraine hx; History of prior pregnancy with IUGR newborn; Gestational hypertension; Postpartum care following vaginal delivery; Contraception management; Pap smear for cervical cancer screening; Hyperlipidemia; Elevated blood pressure reading; Supervision of normal pregnancy; and Hyperthyroidism on their problem list.  Patient reports no complaints.  Denies leaking of fluid/ROM.   The following portions of the patient's history were reviewed and updated as appropriate: allergies, current medications, past family history, past medical history, past social history, past surgical history and problem list. Problem list updated.  Objective:   Vitals:   03/17/24 0958  BP: 130/87  Pulse: 96  Weight: 139 lb 9.6 oz (63.3 kg)    Fetal Status: Fetal Heart Rate (bpm): 156         General:  Alert, oriented and cooperative. Patient is in no acute distress.  Skin: Skin is warm and dry. No rash noted.   Cardiovascular: Normal heart rate noted  Respiratory: Normal respiratory effort, no problems with respiration noted  Abdomen: Soft, gravid, appropriate for gestational age.        Pelvic: Cervical exam deferred        Extremities: Normal range of motion.     Mental Status: Normal mood and affect. Normal behavior. Normal judgment and thought content.   Assessment and Plan:  Pregnancy: [id number] at [redacted]w[redacted]d  1. Supervision of other normal pregnancy, antepartum (Primary) - Reviewed lab results and medical history  2. Anxiety - Doing fine at this time  3. [redacted] weeks gestation of pregnancy - Continue monitoring - Start taking ASA as discussed at previous visit  4. Hyperthyroidism - Pt has not  received call from endo yet; will reach out regarding referral.  Early pregnancy warning signs and general obstetric precautions including but not limited to vaginal bleeding, contractions, leaking of fluid and fetal movement were reviewed in detail with the patient. Please refer to After Visit Summary for other counseling recommendations.   Centering Pregnancy, Session#1: Introduction to model of care. Group determined rules for self-governance and closing phrase. Oriented group to space and mother's notebook.   Facilitated discussion today:  Common discomforts of pregnancy  Mindfulness activity completed as well as introduction to deep breathing for childbirth preparation.    Fundal height and FHR appropriate today unless noted otherwise in plan. Due to decrease in group number, will transition to one-on-one prenatal care beginning next week.    Return in about 4 weeks (around 04/14/2024) for Centering Group (Scheduled).  Future Appointments  Date Time Provider Department Center  04/03/2024  1:00 PM [facility] PROVIDER 1 [facility]  04/03/2024  1:30 PM [facility] US3 [facility]  04/14/2024  9:00 AM CENTERING PROVIDER [facility]  05/12/2024  9:00 AM CENTERING PROVIDER [facility]  05/26/2024  9:00 AM CENTERING PROVIDER [facility]  06/09/2024  9:00 AM CENTERING PROVIDER [facility]  06/20/2024  8:30 AM [place], Betty G, MD [facility]  06/23/2024  9:00 AM CENTERING PROVIDER [facility]  07/07/2024  9:00 AM CENTERING PROVIDER [facility]  07/21/2024  9:00 AM CENTERING PROVIDER [facility]  07/28/2024  9:00 AM CENTERING PROVIDER [facility]  08/04/2024  9:00 AM CENTERING PROVIDER [facility]    [id number] Lucille Saas, CNM

## 2024-03-18 ENCOUNTER — Encounter: Payer: Self-pay | Admitting: Physician Assistant

## 2024-03-22 ENCOUNTER — Telehealth: Payer: Self-pay | Admitting: Family Medicine

## 2024-03-22 NOTE — Telephone Encounter (Signed)
 Patient said she has been experiencing itching and wants to make sure everything is okay. She is [redacted]w[redacted]d and her next appt is 6/27.

## 2024-03-22 NOTE — Telephone Encounter (Signed)
 Returned call to patient. She reports her itching is in her vaginal area for the past 2 days. She reports she is having white runny discharge, no odor.   She reports she was treated for Trich and BV in January. She reports her husband was treated and they abstained from sexual intercourse for 2 weeks after both were treated. She reports she had sexual intercourse 1 week ago and symptoms started again. TOC is not noted in chart.   Offered vaginal swab and patient agreeable. Appointment scheduled.

## 2024-03-23 ENCOUNTER — Encounter: Payer: Self-pay | Admitting: *Deleted

## 2024-03-24 ENCOUNTER — Other Ambulatory Visit: Payer: Self-pay

## 2024-03-24 ENCOUNTER — Other Ambulatory Visit (HOSPITAL_COMMUNITY)
Admission: RE | Admit: 2024-03-24 | Discharge: 2024-03-24 | Disposition: A | Discharge status: Home / Self Care | Source: Ambulatory Visit | Attending: Family Medicine | Admitting: Family Medicine

## 2024-03-24 ENCOUNTER — Ambulatory Visit (INDEPENDENT_AMBULATORY_CARE_PROVIDER_SITE_OTHER)

## 2024-03-24 VITALS — BP 118/73 | HR 88 | Ht 62.0 in | Wt 135.7 lb

## 2024-03-24 DIAGNOSIS — O26892 Other specified pregnancy related conditions, second trimester: Secondary | ICD-10-CM | POA: Diagnosis present

## 2024-03-24 DIAGNOSIS — Z3A17 17 weeks gestation of pregnancy: Secondary | ICD-10-CM

## 2024-03-24 DIAGNOSIS — N898 Other specified noninflammatory disorders of vagina: Secondary | ICD-10-CM | POA: Diagnosis present

## 2024-03-24 NOTE — Progress Notes (Signed)
 Pt here today for self swab per request of RN for pt c/o white vaginal discharge that has some itching.  Pt explained how to obtain self swab and that we will call with abnormal results. Pt verbalized understanding with no further questions.   Jaren Vanetten,RN  03/24/24

## 2024-03-27 LAB — CERVICOVAGINAL ANCILLARY ONLY
Bacterial Vaginitis (gardnerella): NEGATIVE
Candida Glabrata: NEGATIVE
Candida Vaginitis: POSITIVE — AB
Chlamydia: NEGATIVE
Comment: NEGATIVE
Comment: NEGATIVE
Comment: NEGATIVE
Comment: NEGATIVE
Comment: NEGATIVE
Comment: NORMAL
Neisseria Gonorrhea: NEGATIVE
Trichomonas: NEGATIVE

## 2024-03-30 ENCOUNTER — Ambulatory Visit: Payer: Self-pay | Admitting: Family Medicine

## 2024-03-30 MED ORDER — FLUCONAZOLE 150 MG PO TABS
150.0000 mg | ORAL_TABLET | Freq: Once | ORAL | 0 refills | Status: AC
Start: 2024-03-30 — End: 2024-03-30

## 2024-04-03 ENCOUNTER — Other Ambulatory Visit: Payer: Self-pay | Admitting: Physician Assistant

## 2024-04-03 ENCOUNTER — Other Ambulatory Visit: Payer: Self-pay | Admitting: *Deleted

## 2024-04-03 ENCOUNTER — Ambulatory Visit: Attending: Obstetrics and Gynecology | Admitting: Obstetrics and Gynecology

## 2024-04-03 ENCOUNTER — Ambulatory Visit

## 2024-04-03 VITALS — BP 115/71 | HR 92

## 2024-04-03 DIAGNOSIS — Z3A19 19 weeks gestation of pregnancy: Secondary | ICD-10-CM | POA: Diagnosis not present

## 2024-04-03 DIAGNOSIS — Z8759 Personal history of other complications of pregnancy, childbirth and the puerperium: Secondary | ICD-10-CM | POA: Diagnosis not present

## 2024-04-03 DIAGNOSIS — O09299 Supervision of pregnancy with other poor reproductive or obstetric history, unspecified trimester: Secondary | ICD-10-CM

## 2024-04-03 DIAGNOSIS — O09292 Supervision of pregnancy with other poor reproductive or obstetric history, second trimester: Secondary | ICD-10-CM

## 2024-04-03 DIAGNOSIS — Z3480 Encounter for supervision of other normal pregnancy, unspecified trimester: Secondary | ICD-10-CM

## 2024-04-03 DIAGNOSIS — E0590 Thyrotoxicosis, unspecified without thyrotoxic crisis or storm: Secondary | ICD-10-CM | POA: Diagnosis not present

## 2024-04-03 DIAGNOSIS — O99282 Endocrine, nutritional and metabolic diseases complicating pregnancy, second trimester: Secondary | ICD-10-CM | POA: Diagnosis not present

## 2024-04-03 DIAGNOSIS — O99280 Endocrine, nutritional and metabolic diseases complicating pregnancy, unspecified trimester: Secondary | ICD-10-CM | POA: Insufficient documentation

## 2024-04-03 NOTE — Progress Notes (Signed)
 Maternal-Fetal Medicine Consultation Name: Courtney [name] MRN: [id number]  G6 P4015 at 19w 2d gestation. Patient is here for fetal anatomy scan. On cell-free fetal DNA screening, the risks of aneuploidies are not increased.   Obstetrical history significant for 4 term vaginal deliveries including 1 twin delivery.  Her third pregnancy was complicated by fetal growth restriction.  She had gestational hypertension in her previous pregnancy.  Although, the patient has a diagnosis of hypothyroidism (from her chart), patient reports she never had hypothyroidism.  Recent TSH and T4 levels are within normal range.  Ultrasound We performed fetal anatomy scan. No makers of aneuploidies or fetal structural defects are seen. Fetal biometry is consistent with her previously-established dates.  Fetal heart rate and rhythm appears normal.  Amniotic fluid is normal and good fetal activity is seen. Patient understands the limitations of ultrasound in detecting fetal anomalies.   History of gestational hypertension I counseled the patient that recurrence of gestational hypertension/preeclampsia is seen from 5 to 50% of cases.  I discussed the benefit of low-dose aspirin  prophylaxis that delays or prevents preeclampsia.  I encouraged her to start low-dose aspirin .  History of fetal growth restriction Recurrent fetal growth restriction is seen in about 20% of subsequent pregnancies.  I reassured the patient that we will be monitoring for fetal growth restriction in this pregnancy. Hyperthyroidism was not mentioned in her previous H&P (delivery). I reassured the patient of normal TSH and T4 levels.  Recommendations - An appointment was made for her to return in 8 weeks for fetal growth assessment. - Fetal growth assessment at 32 to [redacted] weeks gestation. -TRAb levels may be checked if there is a concern for hyperthyroidism Murrell Arrant' disease)  Consultation including face-to-face (more than 50%) counseling 30  minutes.

## 2024-04-13 ENCOUNTER — Other Ambulatory Visit: Payer: Self-pay | Admitting: Family

## 2024-04-13 DIAGNOSIS — Z8759 Personal history of other complications of pregnancy, childbirth and the puerperium: Secondary | ICD-10-CM

## 2024-04-13 DIAGNOSIS — O99282 Endocrine, nutritional and metabolic diseases complicating pregnancy, second trimester: Secondary | ICD-10-CM

## 2024-04-14 ENCOUNTER — Ambulatory Visit: Payer: Self-pay | Admitting: Family

## 2024-04-14 VITALS — BP 117/77 | HR 73 | Wt 139.6 lb

## 2024-04-14 DIAGNOSIS — O0992 Supervision of high risk pregnancy, unspecified, second trimester: Secondary | ICD-10-CM | POA: Diagnosis not present

## 2024-04-14 DIAGNOSIS — Z1379 Encounter for other screening for genetic and chromosomal anomalies: Secondary | ICD-10-CM

## 2024-04-14 DIAGNOSIS — Z3A20 20 weeks gestation of pregnancy: Secondary | ICD-10-CM

## 2024-04-14 DIAGNOSIS — O09892 Supervision of other high risk pregnancies, second trimester: Secondary | ICD-10-CM

## 2024-04-14 DIAGNOSIS — O0990 Supervision of high risk pregnancy, unspecified, unspecified trimester: Secondary | ICD-10-CM

## 2024-04-14 DIAGNOSIS — Z8759 Personal history of other complications of pregnancy, childbirth and the puerperium: Secondary | ICD-10-CM

## 2024-04-14 NOTE — Patient Instructions (Signed)

## 2024-04-14 NOTE — Progress Notes (Signed)
  [place] [facility] PRENATAL VISIT NOTE- Centering Pregnancy Group #21  Subjective:  Courtney [name] is a 33 y.o. [id number] at [redacted]w[redacted]d being seen today for ongoing prenatal care through Centering Pregnancy.  She is currently monitored for the following issues for this high-risk pregnancy and has Migraine hx; History of prior pregnancy with IUGR newborn; Gestational hypertension; Hyperlipidemia; Supervision of high risk pregnancy, antepartum; and Hyperthyroidism on their problem list.  Patient reports palpitations that increased when coffee intake increased.  Thyroid  labs normal.  Vag. Bleeding: None.  Movement: Present. Denies leaking of fluid/ROM.   The following portions of the patient's history were reviewed and updated as appropriate: allergies, current medications, past family history, past medical history, past social history, past surgical history and problem list. Problem list updated.  Objective:   Today's Vitals   04/14/24 0916  BP: 117/77  Pulse: 73  Weight: 139 lb 9.6 oz (63.3 kg)   Body mass index is 25.53 kg/m.  Fetal Status: Fetal Heart Rate (bpm): 156 Fundal Height: 21 cm Movement: Present     General:  Alert, oriented and cooperative. Patient is in no acute distress.  Skin: Skin is warm and dry. No rash noted.   Cardiovascular: Normal heart rate noted  Respiratory: Normal respiratory effort, no problems with respiration noted  Abdomen: Soft, gravid, appropriate for gestational age.  Pain/Pressure: Absent     Pelvic:         Extremities: Normal range of motion.     Mental Status: Normal mood and affect. Normal behavior. Normal judgment and thought content.   Assessment and Plan:  Pregnancy: [id number] at [redacted]w[redacted]d  1. Encounter for genetic screening (Primary) - AFP, Serum, Open Spina Bifida  2. History of prior pregnancy with IUGR newborn - Follow-up growth ultrasound scheduled for 05/29/24  3.  Supervision of high risk pregnancy, antepartum - Reviewed  ultrasound results - Continue close monitoring. - Will decrease coffee to see if reduce palpitations; if they continue will notify office.    Preterm labor symptoms and general obstetric precautions including but not limited to vaginal bleeding, contractions, leaking of fluid and fetal movement were reviewed in detail with the patient. Please refer to After Visit Summary for other counseling recommendations.    Centering Pregnancy, Session#2:  Introduced new members. Reviewed rules for self-governance and facilitated approach with group. Directed group to mother's notebook. Toured Stage manager and discussed availability of food resources.   Facilitated discussion today:  Nutrition, Genetic screening options with AFP/Quad.   Fundal height and FHR appropriate today unless noted otherwise in plan. Patient to continue group care.     Return in about 4 weeks (around 05/12/2024) for Centering Group (Scheduled).  Future Appointments  Date Time Provider Department Center  05/12/2024  9:00 AM CENTERING PROVIDER [facility]  05/26/2024  9:00 AM CENTERING PROVIDER [facility]  05/29/2024  7:00 AM [facility] PROVIDER 1 [facility]  05/29/2024  7:30 AM [facility] US3 [facility]  06/09/2024  9:00 AM CENTERING PROVIDER [facility]  06/20/2024  8:30 AM [place], Betty G, MD [facility]  06/23/2024  9:00 AM CENTERING PROVIDER [facility]  07/07/2024  9:00 AM CENTERING PROVIDER [facility]  07/21/2024  9:00 AM CENTERING PROVIDER [facility]  07/28/2024  9:00 AM CENTERING PROVIDER [facility]  08/04/2024  9:00 AM CENTERING PROVIDER [facility]    Tjopijy LOISE Pouch, CNM

## 2024-04-24 ENCOUNTER — Other Ambulatory Visit

## 2024-04-24 ENCOUNTER — Other Ambulatory Visit: Payer: Self-pay

## 2024-04-24 DIAGNOSIS — O0990 Supervision of high risk pregnancy, unspecified, unspecified trimester: Secondary | ICD-10-CM

## 2024-04-26 LAB — AFP, SERUM, OPEN SPINA BIFIDA
AFP MoM: 0.89
AFP Value: 78.7 ng/mL
Gest. Age on Collection Date: 22.2 wk
Maternal Age At EDD: 32.1 a
OSBR Risk 1 IN: 10000
Test Results:: NEGATIVE
Weight: 138 [lb_av]

## 2024-05-04 ENCOUNTER — Ambulatory Visit: Payer: Self-pay | Admitting: Family

## 2024-05-12 ENCOUNTER — Encounter: Payer: Self-pay | Admitting: *Deleted

## 2024-05-17 ENCOUNTER — Encounter: Payer: Self-pay | Admitting: *Deleted

## 2024-05-22 ENCOUNTER — Other Ambulatory Visit: Payer: Self-pay | Admitting: *Deleted

## 2024-05-22 DIAGNOSIS — O0990 Supervision of high risk pregnancy, unspecified, unspecified trimester: Secondary | ICD-10-CM

## 2024-05-23 ENCOUNTER — Other Ambulatory Visit

## 2024-05-26 ENCOUNTER — Ambulatory Visit: Payer: Self-pay

## 2024-05-26 VITALS — BP 134/81 | HR 92 | Wt 141.6 lb

## 2024-05-26 DIAGNOSIS — Z1332 Encounter for screening for maternal depression: Secondary | ICD-10-CM | POA: Diagnosis not present

## 2024-05-26 DIAGNOSIS — Z8759 Personal history of other complications of pregnancy, childbirth and the puerperium: Secondary | ICD-10-CM | POA: Diagnosis not present

## 2024-05-26 DIAGNOSIS — Z3A26 26 weeks gestation of pregnancy: Secondary | ICD-10-CM | POA: Insufficient documentation

## 2024-05-26 DIAGNOSIS — O0992 Supervision of high risk pregnancy, unspecified, second trimester: Secondary | ICD-10-CM | POA: Diagnosis not present

## 2024-05-26 DIAGNOSIS — O0990 Supervision of high risk pregnancy, unspecified, unspecified trimester: Secondary | ICD-10-CM

## 2024-05-26 NOTE — Patient Instructions (Signed)
 Family Planning Website [email or web address]

## 2024-05-26 NOTE — Progress Notes (Signed)
 Pregnancy risk form completed today. Rock Skip PEAK

## 2024-05-26 NOTE — Progress Notes (Signed)
  [place] [facility] PRENATAL VISIT NOTE- Centering Pregnancy Group #21  Subjective:  Courtney [name] is a 33 y.o. [id number] at [redacted]w[redacted]d being seen today for ongoing prenatal care through Centering Pregnancy.  She is currently monitored for the following issues for this high-risk pregnancy and has Migraine hx; History of prior pregnancy with IUGR newborn; Gestational hypertension; Hyperlipidemia; Supervision of high risk pregnancy, antepartum; and Hyperthyroidism on their problem list.  Patient reports no complaints.  Palpitations have stopped since discontinuing coffee.  Movement: Present. Denies leaking of fluid/ROM.   The following portions of the patient's history were reviewed and updated as appropriate: allergies, current medications, past family history, past medical history, past social history, past surgical history and problem list. Problem list updated.  Objective:   Vitals:   05/26/24 0927  BP: 134/81  Pulse: 92     Fetal Status: Fetal Heart Rate (bpm): 142 Fundal Height: 25 cm Movement: Present     General:  Alert, oriented and cooperative. Patient is in no acute distress.  Skin: Skin is warm and dry. No rash noted.   Cardiovascular: Normal heart rate noted  Respiratory: Normal respiratory effort, no problems with respiration noted  Abdomen: Soft, gravid, appropriate for gestational age.        Pelvic: Cervical exam deferred        Extremities: Normal range of motion.     Mental Status: Normal mood and affect. Normal behavior. Normal judgment and thought content.   Assessment and Plan:  Pregnancy: [id number] at [redacted]w[redacted]d  1. History of prior pregnancy with IUGR newborn (Primary) - Follow-up ultrasound on 05/29/24  2. Supervision of high risk pregnancy, antepartum - 28 wk labs scheduled for 06/06/24   Preterm labor symptoms and general obstetric precautions including but not limited to vaginal bleeding, contractions, leaking of fluid and fetal movement were reviewed  in detail with the patient. Please refer to After Visit Summary for other counseling recommendations.    Centering Pregnancy, Session#4:  Directed group to mother's notebook.   Facilitated discussion today:  Family Planning, TdAP and ASA Use in Pregnancy.   Fundal height and FHR appropriate today unless noted otherwise in plan. Patient to continue group care.     Return in about 2 weeks (around 06/09/2024) for Centering Group (Scheduled).  Future Appointments  Date Time Provider Department Center  05/29/2024  7:00 AM [facility] PROVIDER 1 [facility]  05/29/2024  7:30 AM [facility] US3 [facility]  06/06/2024  8:20 AM [facility] LAB [facility]  06/09/2024  9:00 AM CENTERING PROVIDER [facility]  06/23/2024  9:00 AM CENTERING PROVIDER [facility]  07/07/2024  9:00 AM CENTERING PROVIDER [facility]  07/21/2024  9:00 AM CENTERING PROVIDER [facility]  07/28/2024  9:00 AM CENTERING PROVIDER [facility]  08/04/2024  9:00 AM CENTERING PROVIDER [facility]    Tjopijy LOISE Pouch, CNM

## 2024-05-29 ENCOUNTER — Other Ambulatory Visit: Payer: Self-pay | Admitting: *Deleted

## 2024-05-29 ENCOUNTER — Ambulatory Visit (HOSPITAL_BASED_OUTPATIENT_CLINIC_OR_DEPARTMENT_OTHER): Admitting: Maternal & Fetal Medicine

## 2024-05-29 ENCOUNTER — Ambulatory Visit: Attending: Maternal & Fetal Medicine

## 2024-05-29 VITALS — BP 128/86 | HR 101

## 2024-05-29 VITALS — BP 142/90

## 2024-05-29 DIAGNOSIS — O0990 Supervision of high risk pregnancy, unspecified, unspecified trimester: Secondary | ICD-10-CM | POA: Diagnosis present

## 2024-05-29 DIAGNOSIS — O09299 Supervision of pregnancy with other poor reproductive or obstetric history, unspecified trimester: Secondary | ICD-10-CM

## 2024-05-29 DIAGNOSIS — O99282 Endocrine, nutritional and metabolic diseases complicating pregnancy, second trimester: Secondary | ICD-10-CM

## 2024-05-29 DIAGNOSIS — Z8759 Personal history of other complications of pregnancy, childbirth and the puerperium: Secondary | ICD-10-CM

## 2024-05-29 DIAGNOSIS — Z3A27 27 weeks gestation of pregnancy: Secondary | ICD-10-CM | POA: Diagnosis not present

## 2024-05-29 DIAGNOSIS — O132 Gestational [pregnancy-induced] hypertension without significant proteinuria, second trimester: Secondary | ICD-10-CM | POA: Diagnosis present

## 2024-05-29 DIAGNOSIS — E0590 Thyrotoxicosis, unspecified without thyrotoxic crisis or storm: Secondary | ICD-10-CM | POA: Insufficient documentation

## 2024-05-29 DIAGNOSIS — O09292 Supervision of pregnancy with other poor reproductive or obstetric history, second trimester: Secondary | ICD-10-CM

## 2024-05-29 NOTE — Progress Notes (Signed)
 Patient information  Patient Name: Courtney [name]  Patient MRN:   [id number]  Referring practice: MFM Referring Provider: [facility]  Problem List   Patient Active Problem List   Diagnosis Date Noted   [redacted] weeks gestation of pregnancy 05/26/2024   Hyperthyroidism 03/17/2024   Supervision of high risk pregnancy, antepartum 01/12/2024   Hyperlipidemia 06/18/2023   Gestational hypertension 02/18/2022   Migraine hx 10/11/2019   History of prior pregnancy with IUGR newborn 10/11/2019    Maternal Fetal medicine Consult  Courtney [name] is a 33 y.o. [id number] at [redacted]w[redacted]d here for ultrasound and consultation. Courtney [name] is doing well today with no acute concerns. Today we focused on the following:   Low TSH (likely normal physiology of pregnancy): The patient had a low TSH 3 months ago but the free T4 and total T3 were normal. It is unclear why a TSH was drawn three months ago.  The TSH is not part of the routine prenatal labs and may have been ordered by mistake.  The TSH will be artificially low in early pregnancy due to the high levels of hCG.  Since her antibodies were negative and her free and total thyroid hormones were normal I do not suspect she has a thyroid problem.  She has not required any medication either.  Low growth AC at 16 th percentile overall. Due to this she will return in 4 weeks.   Sonographic findings Single intrauterine pregnancy at 27w 2d.  Fetal cardiac activity:  Observed and appears normal. Presentation: Cephalic. Interval fetal anatomy appears normal. Fetal biometry shows the estimated fetal weight at the 31 percentile. Amniotic fluid volume: Within normal limits. MVP: 5.9 cm. Placenta: Anterior.  There are limitations of prenatal ultrasound such as the inability to detect certain abnormalities due to poor visualization. Various factors such as fetal position, gestational age and maternal body habitus may increase the difficulty  in visualizing the fetal anatomy.    RecommendationsY - Follow-up growth in 4 weeks due to the low abdominal circumference measurement  - TSH does not need to be reassessed during pregnancy unless symptoms of hyperthyroidism develop.  A TSH can be done postpartum around 12 weeks to see if it normalizes.  There are limitations of prenatal ultrasound such as the inability to detect certain abnormalities due to poor visualization. Various factors such as fetal position, gestational age and maternal body habitus may increase the difficulty in visualizing the fetal anatomy.    Recommendations -Serial growth ultrasounds every 4-6 weeks until delivery -Antenatal testing to start around 32 weeks  -Delivery around EDD   Review of Systems: A review of systems was performed and was negative except per HPI   Vitals and Physical Exam    05/29/2024    7:59 AM 05/29/2024    7:01 AM 05/26/2024    9:27 AM  Vitals with BMI  Weight   141 lbs 10 oz  Systolic 128 142 865  Diastolic 86 90 81  Pulse 101  92    Sitting comfortably on the sonogram table Nonlabored breathing Normal rate and rhythm Abdomen is nontender  Past pregnancies OB History  Gravida Para Term Preterm AB Living  6 4 4  0 1 5  SAB IAB Ectopic Multiple Live Births  1 0 0 1 5    # Outcome Date GA Lbr Len/2nd Weight Sex Type Anes PTL Lv  6 Current           5 Term 02/18/22  [redacted]w[redacted]d / 00:06 6 lb 12.6 oz (3.08 kg) F Vag-Spont EPI  LIV  4 Term 10/11/19 [redacted]w[redacted]d 00:31 / 00:12 5 lb 11.9 oz (2.605 kg) M Vag-Spont EPI  LIV  3A Term 03/20/18 [redacted]w[redacted]d / 00:11 5 lb 4 oz (2.38 kg) M Vag-Spont EPI  LIV  3B Term 03/20/18 [redacted]w[redacted]d / 00:15 5 lb 4.7 oz (2.4 kg) M Vag-Spont EPI  LIV  2 SAB 2018          1 Term 02/14/15 [redacted]w[redacted]d 02:47 / 00:09 5 lb 10.8 oz (2.575 kg) F Vag-Spont EPI  LIV     I spent 20 minutes reviewing the patients chart, including labs and images as well as counseling the patient about her medical conditions. Greater than 50% of the time was  spent in direct face-to-face patient counseling.  Delora Smaller  MFM, [facility]   05/29/2024  8:14 AM

## 2024-06-06 ENCOUNTER — Other Ambulatory Visit

## 2024-06-09 ENCOUNTER — Other Ambulatory Visit

## 2024-06-09 ENCOUNTER — Ambulatory Visit (INDEPENDENT_AMBULATORY_CARE_PROVIDER_SITE_OTHER): Payer: Self-pay | Admitting: Family

## 2024-06-09 VITALS — BP 121/85 | HR 93 | Wt 143.8 lb

## 2024-06-09 DIAGNOSIS — O0990 Supervision of high risk pregnancy, unspecified, unspecified trimester: Secondary | ICD-10-CM

## 2024-06-09 DIAGNOSIS — O0993 Supervision of high risk pregnancy, unspecified, third trimester: Secondary | ICD-10-CM | POA: Diagnosis not present

## 2024-06-09 DIAGNOSIS — Z3A28 28 weeks gestation of pregnancy: Secondary | ICD-10-CM | POA: Diagnosis not present
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
## 2024-06-10 LAB — CBC
Hematocrit: 32.2 % — ABNORMAL LOW (ref 34.0–46.6)
Hemoglobin: 10 g/dL — ABNORMAL LOW (ref 11.1–15.9)
MCH: 25.4 pg — ABNORMAL LOW (ref 26.6–33.0)
MCHC: 31.1 g/dL — ABNORMAL LOW (ref 31.5–35.7)
MCV: 82 fL (ref 79–97)
Platelets: 256 x10E3/uL (ref 150–450)
RBC: 3.93 x10E6/uL (ref 3.77–5.28)
RDW: 16.4 % — ABNORMAL HIGH (ref 11.7–15.4)
WBC: 6.9 x10E3/uL (ref 3.4–10.8)

## 2024-06-10 LAB — GLUCOSE TOLERANCE, 2 HOURS W/ 1HR
Glucose, 1 hour: 159 mg/dL (ref 70–179)
Glucose, 2 hour: 116 mg/dL (ref 70–152)
Glucose, Fasting: 80 mg/dL (ref 70–91)

## 2024-06-10 LAB — RPR: RPR Ser Ql: NONREACTIVE

## 2024-06-10 LAB — HIV ANTIBODY (ROUTINE TESTING W REFLEX): HIV Screen 4th Generation wRfx: NONREACTIVE

## 2024-06-20 ENCOUNTER — Encounter: Payer: Medicaid Other | Admitting: Family Medicine

## 2024-06-21 ENCOUNTER — Encounter: Payer: Self-pay | Admitting: Family

## 2024-06-21 ENCOUNTER — Ambulatory Visit: Payer: Self-pay | Admitting: Family

## 2024-06-21 DIAGNOSIS — O99013 Anemia complicating pregnancy, third trimester: Secondary | ICD-10-CM | POA: Insufficient documentation

## 2024-06-23 ENCOUNTER — Ambulatory Visit: Payer: Self-pay | Admitting: Family

## 2024-06-23 VITALS — BP 136/71 | HR 98 | Wt 142.2 lb

## 2024-06-23 DIAGNOSIS — O99013 Anemia complicating pregnancy, third trimester: Secondary | ICD-10-CM

## 2024-06-23 DIAGNOSIS — Z8759 Personal history of other complications of pregnancy, childbirth and the puerperium: Secondary | ICD-10-CM

## 2024-06-23 DIAGNOSIS — Z3A30 30 weeks gestation of pregnancy: Secondary | ICD-10-CM

## 2024-06-23 DIAGNOSIS — O0993 Supervision of high risk pregnancy, unspecified, third trimester: Secondary | ICD-10-CM

## 2024-06-23 DIAGNOSIS — O0990 Supervision of high risk pregnancy, unspecified, unspecified trimester: Secondary | ICD-10-CM

## 2024-06-23 DIAGNOSIS — Z23 Encounter for immunization: Secondary | ICD-10-CM

## 2024-06-23 MED ORDER — ACCRUFER 30 MG PO CAPS: 1.0000 | ORAL_CAPSULE | Freq: Two times a day (BID) | ORAL | 1 refills | Status: DC

## 2024-06-23 NOTE — Progress Notes (Signed)
  [facility] PRENATAL VIS[facility] NOTE- Centering Pregnancy Group #21  Subjective:  Courtney [name] is a 33 y.o. H3E5984 at [redacted]w[redacted]d being seen today for ongoing prenatal care through Centering Pregnancy.  She is currently monitored for the following issues for this high-risk pregnancy and has Migraine hx; History of prior pregnancy with IUGR newborn; Gestational hypertension; Hyperlipidemia; Supervision of high risk pregnancy, antepartum; Hyperthyroidism; and Anemia affecting pregnancy in third trimester on their problem list.  Patient reports no complaints.   .  .  Movement: Present. Denies leaking of fluid/ROM.   The following portions of the patient's history were reviewed and updated as appropriate: allergies, current medications, past family history, past medical history, past social history, past surgical history and problem list. Problem list updated.  Objective:   Today's Vitals   06/23/24 0919  BP: 136/71  Pulse: 98  Weight: 142 lb 3.2 oz (64.5 kg)   Body mass index is 26.01 kg/m.  Fetal Status: Fetal Heart Rate (bpm): 132 Fundal Height: 32 cm Movement: Present     General:  Alert, oriented and cooperative. Patient is in no acute distress.  Skin: Skin is warm and dry. No rash noted.   Cardiovascular: Normal heart rate noted  Respiratory: Normal respiratory effort, no problems with respiration noted  Abdomen: Soft, gravid, appropriate for gestational age.        Pelvic: Cervical exam deferred        Extremities: Normal range of motion.     Mental Status: Normal mood and affect. Normal behavior. Normal judgment and thought content.   Assessment and Plan:  Pregnancy: H3E5984 at [redacted]w[redacted]d  1. Anemia affecting pregnancy in third trimester (Primary) - Ferric Maltol  (ACCRUFER ) 30 MG CAPS; Take 1 capsule (30 mg total) by mouth 2 (two) times daily.  Dispense: 60 capsule; Refill: 1  2. Supervision of high risk pregnancy, antepartum - Tdap vaccine greater than or equal  to 7yo IM  3. History of prior pregnancy with IUGR newborn - Continue close monitoring   Preterm labor symptoms and general obstetric precautions including but not limited to vaginal bleeding, contractions, leaking of fluid and fetal movement were reviewed in detail with the patient. Please refer to After Visit Summary for other counseling recommendations.   Centering Pregnancy, Session #6: Reviewed resources in mother's notebook and discussed choosing pediatrician, feeding method, and circumcision.     Facilitated discussion today:  Activities:  Preparing for Birth and Lactation Consultant visit to discuss breastfeeding and resources available.    Fundal height and FHR appropriate today unless noted otherwise in plan. Patient to continue group care.   Return in about 2 weeks (around 07/07/2024) for Centering Group (Scheduled).  Future Appointments  Date Time Provider Department Center  06/26/2024  7:15 AM [facility] PROVIDER 1 [facility]  06/26/2024  7:30 AM [facility] US1 [facility]  07/07/2024  9:00 AM CENTERING PROVIDER [facility]  07/21/2024  9:00 AM CENTERING PROVIDER [facility]  07/28/2024  9:00 AM CENTERING PROVIDER [facility]  08/04/2024  9:00 AM CENTERING PROVIDER [facility]    Tjopijy LOISE Pouch, CNM

## 2024-06-26 ENCOUNTER — Other Ambulatory Visit: Payer: Self-pay | Admitting: *Deleted

## 2024-06-26 ENCOUNTER — Ambulatory Visit (HOSPITAL_BASED_OUTPATIENT_CLINIC_OR_DEPARTMENT_OTHER)

## 2024-06-26 ENCOUNTER — Other Ambulatory Visit: Payer: Self-pay | Admitting: Maternal & Fetal Medicine

## 2024-06-26 ENCOUNTER — Ambulatory Visit: Attending: Maternal & Fetal Medicine | Admitting: Maternal & Fetal Medicine

## 2024-06-26 VITALS — BP 127/82

## 2024-06-26 DIAGNOSIS — O365930 Maternal care for other known or suspected poor fetal growth, third trimester, not applicable or unspecified: Secondary | ICD-10-CM

## 2024-06-26 DIAGNOSIS — O09293 Supervision of pregnancy with other poor reproductive or obstetric history, third trimester: Secondary | ICD-10-CM | POA: Insufficient documentation

## 2024-06-26 DIAGNOSIS — O99283 Endocrine, nutritional and metabolic diseases complicating pregnancy, third trimester: Secondary | ICD-10-CM

## 2024-06-26 DIAGNOSIS — O0990 Supervision of high risk pregnancy, unspecified, unspecified trimester: Secondary | ICD-10-CM

## 2024-06-26 DIAGNOSIS — E0590 Thyrotoxicosis, unspecified without thyrotoxic crisis or storm: Secondary | ICD-10-CM | POA: Diagnosis not present

## 2024-06-26 DIAGNOSIS — O133 Gestational [pregnancy-induced] hypertension without significant proteinuria, third trimester: Secondary | ICD-10-CM

## 2024-06-26 DIAGNOSIS — Z3A31 31 weeks gestation of pregnancy: Secondary | ICD-10-CM | POA: Diagnosis not present

## 2024-06-26 DIAGNOSIS — Z8759 Personal history of other complications of pregnancy, childbirth and the puerperium: Secondary | ICD-10-CM

## 2024-06-26 DIAGNOSIS — O09299 Supervision of pregnancy with other poor reproductive or obstetric history, unspecified trimester: Secondary | ICD-10-CM

## 2024-06-26 NOTE — Progress Notes (Signed)
 Patient information  Patient Name: Courtney [name]  Patient MRN:   [id number]  Referring practice: MFM Referring Provider: [facility])  Problem List   Patient Active Problem List   Diagnosis Date Noted   Anemia affecting pregnancy in third trimester 06/21/2024   Hyperthyroidism 03/17/2024   Supervision of high risk pregnancy, antepartum 01/12/2024   Hyperlipidemia 06/18/2023   Gestational hypertension 02/18/2022   Migraine hx 10/11/2019   History of prior pregnancy with IUGR newborn 10/11/2019    Maternal Fetal Medicine Consult Courtney [name] is a 33 y.o. H3E5984 at [redacted]w[redacted]d here for ultrasound and consultation. She had Low risk aneuploidy screening of a female fetus. Carrier screening was Negative for the basic screening (SMA, alpha-thal, beta-thal, and cystic fibroisis. Maternal serum AFP was negative. She has no acute concerns.   Today we focused on the following:   Fetal growth restriction  I discussed the diagnosis, management and prognosis of fetal growth restriction (FGR) with the patient today. The patient has had multiple smaller newborns in the past and this is likely constitutionally small fetus. I discussed the various causes of growth restriction including constitutionally small fetus, placental insufficiency, genetic problems and chronic maternal disease. Currently there is no evidence of sonographic stigmata suggesting infection or aneuploidy.  I discussed the most likely cause of her fetal growth restriction is either a constitutionally small fetus or placental insufficiency. We discussed it is often challenging to differentiate between a fetus that is constitutionally small but is fulfilling its growth potential and a fetus that is not fulfilling its growth potential because of an underlying pathologic condition. Approximately 70% of fetuses with birth weight below the 10th percentile for gestational age are constitutionally small; in the remaining  30%, the cause of the small size is pathologic, meeting intrauterine growth restriction definition.  However, the smaller the fetus in the earlier onset of growth restriction the more likely there is a pathologic process causing the growth restriction.  We also discussed the potential for genetic or infectious causes which would only be diagnosed with amniocentesis.  The patient is not interested in this at this time given that there is no treatment for either condition.  I also discussed the importance of antenatal fetal surveillance including antenatal testing and umbilical artery Doppler assessment.  Sonographic findings Single intrauterine pregnancy at 31w 2d. Fetal cardiac activity:  Observed and appears normal. Presentation: Cephalic. Interval fetal anatomy appears normal. Fetal biometry shows the estimated fetal weight at the 17 percentile and the abdominal circumference at the 9 percentile.  Amniotic fluid: Within normal limits.  MVP: 4.01 cm. Placenta: Anterior. Umbilical artery dopplers findings: -S/D:3.1 which are normal at this gestational age.  -Absent end-diastolic flow: No.  -Reversed end-diastolic flow:  No.   Recommendations - Continue weekly UA dopplers until delivery.  - Continue to avoid tobacco use and exposure to harmful substances - Weekly antenatal testing to start at 28 weeks (typically NST and then add BPP at 32 weeks )  - Twice weekly antenatal testing may be indicated if there is very poor fetal growth, if the UA Dopplers have peroids of absent end diastolic flow or if the antenatal testing is equivocal.  - Betamethasone may be indicated if there is concern for needing to deliver within 7 days.  At this time there is no indication present. - Serial growth US  every 3 weeks until delivery  - Delivery timing will be pending clinical course  Review of Systems: A review of  systems was performed and was negative except per HPI   Past Obstetrical History:  OB History   Gravida Para Term Preterm AB Living  6 4 4  0 1 5  SAB IAB Ectopic Multiple Live Births  1 0 0 1 5    # Outcome Date GA Lbr Len/2nd Weight Sex Type Anes PTL Lv  6 Current           5 Term 02/18/22 [redacted]w[redacted]d / 00:06 6 lb 12.6 oz (3.08 kg) F Vag-Spont EPI  LIV  4 Term 10/11/19 [redacted]w[redacted]d 00:31 / 00:12 5 lb 11.9 oz (2.605 kg) M Vag-Spont EPI  LIV  3A Term 03/20/18 [redacted]w[redacted]d / 00:11 5 lb 4 oz (2.38 kg) M Vag-Spont EPI  LIV  3B Term 03/20/18 [redacted]w[redacted]d / 00:15 5 lb 4.7 oz (2.4 kg) M Vag-Spont EPI  LIV  2 SAB 2018          1 Term 02/14/15 [redacted]w[redacted]d 02:47 / 00:09 5 lb 10.8 oz (2.575 kg) F Vag-Spont EPI  LIV     Past Medical History:  Past Medical History:  Diagnosis Date   Anemia    Anxiety 10/11/2019   Contraception management 03/30/2022   Elevated blood pressure reading 06/18/2023   Migraine    Pap smear for cervical cancer screening 03/30/2022   Postpartum care following vaginal delivery 03/30/2022   SVD (12/23) 10/12/2019   Trichomonal vaginitis during pregnancy    Vaginal delivery 02/18/2022     Past Surgical History:    Past Surgical History:  Procedure Laterality Date   NO PAST SURGERIES     WISDOM TOOTH EXTRACTION       Home Medications:   Current Outpatient Medications on File Prior to Visit  Medication Sig Dispense Refill   aspirin  EC 81 MG tablet Take 1 tablet (81 mg total) by mouth daily. Swallow whole. 30 tablet 12   Ferric Maltol  (ACCRUFER ) 30 MG CAPS Take 1 capsule (30 mg total) by mouth 2 (two) times daily. 60 capsule 1   ferrous gluconate  (FERGON) 324 MG tablet Take 1 tablet (324 mg total) by mouth every other day. (Patient not taking: Reported on 06/23/2024) 30 tablet 2   prenatal vitamin w/FE, FA (PRENATAL 1 + 1) 27-1 MG TABS tablet Take 1 tablet by mouth daily at 12 noon.     No current facility-administered medications on file prior to visit.      Allergies:   No Known Allergies   Physical Exam:   Vitals:   06/26/24 0728  BP: 127/82   Sitting comfortably on the  sonogram table Nonlabored breathing Normal rate and rhythm Abdomen is nontender  Thank you for the opportunity to be involved with this patient's care. Please let us  know if we can be of any further assistance.   30 minutes of time was spent reviewing the patient's chart including labs, imaging and documentation.  At least 50% of this time was spent with direct patient care discussing the diagnosis, management and prognosis of her care.  Delora Smaller MFM, [facility]   06/26/2024  9:12 AM

## 2024-07-04 ENCOUNTER — Ambulatory Visit (HOSPITAL_BASED_OUTPATIENT_CLINIC_OR_DEPARTMENT_OTHER)

## 2024-07-04 ENCOUNTER — Ambulatory Visit (HOSPITAL_BASED_OUTPATIENT_CLINIC_OR_DEPARTMENT_OTHER): Attending: Obstetrics and Gynecology | Admitting: Obstetrics and Gynecology

## 2024-07-04 DIAGNOSIS — O09293 Supervision of pregnancy with other poor reproductive or obstetric history, third trimester: Secondary | ICD-10-CM | POA: Diagnosis not present

## 2024-07-04 DIAGNOSIS — Z3A32 32 weeks gestation of pregnancy: Secondary | ICD-10-CM

## 2024-07-04 DIAGNOSIS — O365930 Maternal care for other known or suspected poor fetal growth, third trimester, not applicable or unspecified: Secondary | ICD-10-CM

## 2024-07-04 NOTE — Progress Notes (Signed)
 Maternal-Fetal Medicine Consultation  Name: Courtney [name]  MRN: [id number]  GA: H3E5984 [redacted]w[redacted]d   Fetal growth restriction.  On ultrasound performed last week, abdominal circumference measurement was at the 9th percentile and the estimated fetal weight at the 17th percentile. She does not have gestational diabetes or hypertension.  Blood pressure today at our office is 125/76 mmHg. Ultrasound Amniotic fluid is normal good fetal activity seen.  Umbilical artery Doppler showed normal forward diastolic flow.  Antenatal testing is reassuring.  BPP 8/8. I reassured the patient of the findings and discussed ultrasound protocol of monitoring fetal growth restriction. Patient reported that she completed a family and had questions about bilateral tubal ligation.  I counseled the patient that postpartum bilateral tubal ligation can be performed after delivery.  Epidural analgesia catheter may be left in situ after delivery to facilitate regional anesthesia.  Patient is considering bilateral tubal ligation.  I informed her that it is preferable that she is signed the consent form at least a month before the procedure.  Recommendations - Continue weekly antenatal testing till delivery.     Consultation including face-to-face (more than 50%) counseling 10 minutes.

## 2024-07-05 ENCOUNTER — Other Ambulatory Visit: Payer: Self-pay | Admitting: *Deleted

## 2024-07-05 DIAGNOSIS — O365930 Maternal care for other known or suspected poor fetal growth, third trimester, not applicable or unspecified: Secondary | ICD-10-CM

## 2024-07-07 ENCOUNTER — Ambulatory Visit: Payer: Self-pay | Admitting: Advanced Practice Midwife

## 2024-07-07 VITALS — BP 115/80 | HR 84 | Wt 146.3 lb

## 2024-07-07 DIAGNOSIS — E0590 Thyrotoxicosis, unspecified without thyrotoxic crisis or storm: Secondary | ICD-10-CM

## 2024-07-07 DIAGNOSIS — O0990 Supervision of high risk pregnancy, unspecified, unspecified trimester: Secondary | ICD-10-CM

## 2024-07-07 DIAGNOSIS — O0993 Supervision of high risk pregnancy, unspecified, third trimester: Secondary | ICD-10-CM | POA: Diagnosis not present

## 2024-07-07 DIAGNOSIS — O99013 Anemia complicating pregnancy, third trimester: Secondary | ICD-10-CM

## 2024-07-07 DIAGNOSIS — Z3009 Encounter for other general counseling and advice on contraception: Secondary | ICD-10-CM

## 2024-07-07 DIAGNOSIS — Z8759 Personal history of other complications of pregnancy, childbirth and the puerperium: Secondary | ICD-10-CM | POA: Diagnosis not present

## 2024-07-07 DIAGNOSIS — O99283 Endocrine, nutritional and metabolic diseases complicating pregnancy, third trimester: Secondary | ICD-10-CM

## 2024-07-07 DIAGNOSIS — Z3A32 32 weeks gestation of pregnancy: Secondary | ICD-10-CM

## 2024-07-07 NOTE — Progress Notes (Signed)
 PRENATAL VISIT NOTE- Centering Pregnancy Cycle 21 , Session # 7  Subjective:  Courtney [name] is a 33 y.o. H3E5984 at [redacted]w[redacted]d being seen today for ongoing prenatal care through Centering Pregnancy.  She is currently monitored for the following issues for this high-risk pregnancy.  Patient Active Problem List   Diagnosis Date Noted   Anemia affecting pregnancy in third trimester 06/21/2024   Hyperthyroidism 03/17/2024   Supervision of high risk pregnancy, antepartum 01/12/2024   Hyperlipidemia 06/18/2023   Gestational hypertension 02/18/2022   Migraine hx 10/11/2019   History of prior pregnancy with IUGR newborn 10/11/2019   Poor fetal growth affecting management of mother in third trimester      Patient reports no complaints.  Contractions: Not present. Vag. Bleeding: None.  Movement: Present. Denies leaking of fluid/ROM.   The following portions of the patient's history were reviewed and updated as appropriate: allergies, current medications, past family history, past medical history, past social history, past surgical history and problem list. Problem list updated.  Objective:   Vitals:   07/07/24 0940  BP: 115/80  Pulse: 84  Weight: 146 lb 4.8 oz (66.4 kg)    Fetal Status: Fetal Heart Rate (bpm): 140 Fundal Height: 34 cm Movement: Present     General:  Alert, oriented and cooperative. Patient is in no acute distress.  Skin: Skin is warm and dry. No rash noted.   Cardiovascular: Normal heart rate noted  Respiratory: Normal respiratory effort, no problems with respiration noted  Abdomen: Soft, gravid, appropriate for gestational age.  Pain/Pressure: Absent     Pelvic: Cervical exam deferred        Extremities: Normal range of motion.  Edema: None  Mental Status: Normal mood and affect. Normal behavior. Normal judgment and thought content.   Assessment and Plan:  Pregnancy: H3E5984 at [redacted]w[redacted]d  1. Supervision of high risk pregnancy, antepartum  (Primary) --Anticipatory guidance about next visits/weeks of pregnancy given.    Centering Pregnancy, Session#7: Reviewed resources in [employer].   Facilitated discussion today:  Comfort measures for labor and ice exercise for practice, and postpartum mood changes Mindfulness activity with mindful listening  Fundal height and FHR appropriate today unless noted otherwise in plan. Patient to continue group care.   2. Anemia affecting pregnancy in third trimester --Hgb 10 on 8/22 --Taking oral iron  3. History of prior pregnancy with IUGR newborn --EFW  4. Hyperthyroidism affecting pregnancy in second trimester --Dx in chart, single below normal range TSH 01/31/2024 in early pregnancy. Free T3/T4 wnl.  Pt does not think she ever had  hyperthyroid and has never had treatment. --Consider repeat of thyroid  labs in third trimester or postpartum.   5. Unwanted fertility --Consented for BTL with Dr Danny today, pt unsure.  Contraceptive counseling for alternatives, including LARCs.    6.  32 weeks of pregnancy  Preterm labor symptoms and general obstetric precautions including but not limited to vaginal bleeding, contractions, leaking of fluid and fetal movement were reviewed in detail with the patient. Please refer to After Visit Summary for other counseling recommendations.  Return for Centering Sessions as scheduled.  Future Appointments  Date Time Provider Department Center  07/11/2024  2:00 PM [facility] PROVIDER 1 [facility]  07/11/2024  2:15 PM [facility] US1 [facility]  07/18/2024  2:00 PM [facility] PROVIDER 1 [facility]  07/18/2024  2:15 PM [facility] US5 [facility]  07/21/2024  9:00 AM CENTERING PROVIDER [facility]  07/26/2024  3:15 PM [facility]  PROVIDER 1 [facility]  07/26/2024  3:30 PM [facility] US2 [facility]  07/28/2024  9:00 AM CENTERING PROVIDER [facility]  08/02/2024  3:15 PM [facility] PROVIDER 1 [facility]  08/02/2024  3:30 PM [facility] US2 [facility]  08/04/2024   9:00 AM CENTERING PROVIDER [facility]  08/10/2024  1:15 PM [facility] PROVIDER 1 [facility]  08/10/2024  1:30 PM [facility] US1 [facility]  08/15/2024  8:55 AM Nicholaus Burnard HERO, MD [facility]  08/22/2024  9:15 AM Regino, Camie LABOR, CNM [facility]    Olam Boards, CNM

## 2024-07-10 ENCOUNTER — Encounter: Payer: Self-pay | Admitting: *Deleted

## 2024-07-11 ENCOUNTER — Ambulatory Visit

## 2024-07-12 ENCOUNTER — Encounter: Payer: Self-pay | Admitting: *Deleted

## 2024-07-13 ENCOUNTER — Encounter: Payer: Self-pay | Admitting: Internal Medicine

## 2024-07-13 ENCOUNTER — Ambulatory Visit (INDEPENDENT_AMBULATORY_CARE_PROVIDER_SITE_OTHER): Admitting: Internal Medicine

## 2024-07-13 VITALS — BP 124/72 | HR 97 | Ht 62.0 in | Wt 146.0 lb

## 2024-07-13 DIAGNOSIS — E0590 Thyrotoxicosis, unspecified without thyrotoxic crisis or storm: Secondary | ICD-10-CM | POA: Diagnosis not present

## 2024-07-13 NOTE — Progress Notes (Unsigned)
 Name: Courtney [name]  MRN/ DOB: [id number], [birth year]    Age/ Sex: 33 y.o., female    PCP: [place], Betty G, MD   Reason for Endocrinology Evaluation: Hyperthyroidism     Date of Initial Endocrinology Evaluation: 07/13/2024     HPI: [name] is a 33 y.o. female with a past medical history of anemia. The patient presented for initial endocrinology clinic visit on 07/13/2024 for consultative assistance with her Hyperthyroidism.     Pt is G6P5 at 33.5 weeks of gestation.  During prenatal evaluation she was noted with a low TSH at 0.029 u IU/ML in April, 2025 with normal free T4 and T3.SABRA  TRAb was undetectable <1.10  EDD 08/26/2024- boy  No prior hx of thyroid  disease Pt with appropriate weight gain  No local neck swelling  Has occasional palpitations, worse with coffee  No tremors  No constipation or diarrhea  No jittery sensation,feels anxious at times  No eye symptoms  just fatigue   No FH of thyroid  disease   HISTORY:  Past Medical History:  Past Medical History:  Diagnosis Date   Anemia    Anxiety 10/11/2019   Contraception management 03/30/2022   Elevated blood pressure reading 06/18/2023   Migraine    Pap smear for cervical cancer screening 03/30/2022   Postpartum care following vaginal delivery 03/30/2022   SVD (12/23) 10/12/2019   Trichomonal vaginitis during pregnancy    Vaginal delivery 02/18/2022   Past Surgical History:  Past Surgical History:  Procedure Laterality Date   NO PAST SURGERIES     WISDOM TOOTH EXTRACTION      Social History:  reports that she has never smoked. She has never used smokeless tobacco. She reports that she does not drink alcohol and does not use drugs. Family History: family history includes Autism in her paternal uncle; Cancer in her paternal grandfather; Diabetes in her maternal grandmother, maternal uncle, and mother; Fibromyalgia in her mother; Heart attack in her maternal grandfather; Heart disease in her  paternal grandmother; Hypertension in her father, maternal grandfather, and paternal grandmother; Kidney disease in her father; Multiple sclerosis in her mother; Stroke in her maternal grandfather and paternal grandmother.   HOME MEDICATIONS: Allergies as of 07/13/2024   No Known Allergies      Medication List        Accurate as of July 13, 2024  7:07 AM. If you have any questions, ask your nurse or doctor.          ACCRUFeR  30 MG Caps Generic drug: Ferric Maltol  Take 1 capsule (30 mg total) by mouth 2 (two) times daily.   aspirin  EC 81 MG tablet Take 1 tablet (81 mg total) by mouth daily. Swallow whole.   ferrous gluconate  324 MG tablet Commonly known as: FERGON Take 1 tablet (324 mg total) by mouth every other day.   prenatal vitamin w/FE, FA 27-1 MG Tabs tablet Take 1 tablet by mouth daily at 12 noon.          REVIEW OF SYSTEMS: A comprehensive ROS was conducted with the patient and is negative except as per HPI and below:  ROS     OBJECTIVE:  VS: LMP  (Approximate) Comment: pt states menstrual cycle has not started since after last pregnancy.   Wt Readings from Last 3 Encounters:  07/07/24 146 lb 4.8 oz (66.4 kg)  06/23/24 142 lb 3.2 oz (64.5 kg)  06/09/24 143 lb 12.8 oz (65.2 kg)     EXAM: General:  Pt appears well and is in NAD  Eyes: External eye exam normal without stare, lid lag or exophthalmos.  EOM intact.  PERRL.  Neck: General: Supple without adenopathy. Thyroid : Thyroid  size normal.  Questionable right thyroid  asymmetry  Lungs: Clear with good BS bilat   Heart: Auscultation: RRR.  Abdomen: Soft, nontender  Extremities:  BL LE: No pretibial edema   Mental Status: Judgment, insight: Intact Orientation: Oriented to time, place, and person Mood and affect: No depression, anxiety, or agitation     DATA REVIEWED: ***    Latest Reference Range & Units 02/09/24 10:18  Thyrotropin Receptor Ab 0.00 - 1.75 IU/L <1.10     ASSESSMENT/PLAN/RECOMMENDATIONS:   Subclinical Hyperthyroidism:   - Patient is clinically euthyroid -No local neck symptoms - Differential diagnosis includes pregnancy related hyperthyroid, Graves' disease, autonomous thyroid  nodule - Repeat TFTs***   Medications :   Follow-up in 4 months  Signed electronically by: Stefano Redgie Butts, MD  [facility]  [facility] [address] [place] [place], [place] [address] Phone: [phone] FAX: [phone]   CC: [place], Betty G, MD [address] [place] [address] Phone: [phone] Fax: [phone]   Return to Endocrinology clinic as below: Future Appointments  Date Time Provider Department Center  07/13/2024  9:30 AM Shekina Cordell, Donell Redgie, MD [facility] None  07/18/2024  2:00 PM [facility] PROVIDER 1 [facility]  07/18/2024  2:15 PM [facility] US5 [facility]  07/21/2024  9:00 AM CENTERING PROVIDER [facility]  07/26/2024  3:15 PM [facility] PROVIDER 1 [facility]  07/26/2024  3:30 PM [facility] US2 [facility]  07/28/2024  9:00 AM CENTERING PROVIDER [facility]  08/02/2024  3:15 PM [facility] PROVIDER 1 [facility]  08/02/2024  3:30 PM [facility] US2 [facility]  08/04/2024  9:00 AM CENTERING PROVIDER [facility]  08/10/2024  1:15 PM [facility] PROVIDER 1 [facility]  08/10/2024  1:30 PM [facility] US1 [facility]  08/15/2024  8:55 AM Nicholaus Burnard HERO, MD [facility]  08/22/2024  9:15 AM Regino Camie LABOR, CNM [facility]

## 2024-07-14 ENCOUNTER — Telehealth: Payer: Self-pay

## 2024-07-14 ENCOUNTER — Ambulatory Visit: Payer: Self-pay | Admitting: Internal Medicine

## 2024-07-14 NOTE — Telephone Encounter (Signed)
 Contact patient to schedule 1 month lab visit

## 2024-07-14 NOTE — Telephone Encounter (Signed)
 Can you please contact the patient and schedule her for a lab appointment only in 1 month?   Thanks

## 2024-07-17 LAB — THYROID STIMULATING IMMUNOGLOBULIN: TSI: <89 %{baseline} (ref <140)

## 2024-07-17 LAB — T4: T4, Total: 15.6 ug/dL — ABNORMAL HIGH (ref 5.1–11.9)

## 2024-07-17 LAB — TSH: TSH: 1.35 m[IU]/L

## 2024-07-17 LAB — T3: T3, Total: 172 ng/dL (ref 76–181)

## 2024-07-17 NOTE — Telephone Encounter (Signed)
 [email or web address],[email or web address] to schedule 1 month lab appointment.

## 2024-07-18 ENCOUNTER — Ambulatory Visit

## 2024-07-21 ENCOUNTER — Ambulatory Visit: Payer: Self-pay

## 2024-07-21 ENCOUNTER — Other Ambulatory Visit: Payer: Self-pay

## 2024-07-21 VITALS — BP 119/77 | HR 90 | Wt 146.8 lb

## 2024-07-21 DIAGNOSIS — O365930 Maternal care for other known or suspected poor fetal growth, third trimester, not applicable or unspecified: Secondary | ICD-10-CM | POA: Diagnosis not present

## 2024-07-21 DIAGNOSIS — Z3A34 34 weeks gestation of pregnancy: Secondary | ICD-10-CM

## 2024-07-21 DIAGNOSIS — O133 Gestational [pregnancy-induced] hypertension without significant proteinuria, third trimester: Secondary | ICD-10-CM

## 2024-07-21 DIAGNOSIS — O0993 Supervision of high risk pregnancy, unspecified, third trimester: Secondary | ICD-10-CM | POA: Diagnosis not present

## 2024-07-21 DIAGNOSIS — O0990 Supervision of high risk pregnancy, unspecified, unspecified trimester: Secondary | ICD-10-CM

## 2024-07-21 NOTE — Progress Notes (Signed)
 BTL: consent resigned today. Rock Skip PEAK

## 2024-07-21 NOTE — Progress Notes (Signed)
  [facility] PRENATAL VISIT NOTE- Centering Pregnancy Group #21  Subjective:  Courtney [name] is a 33 y.o. H3E5984 at [redacted]w[redacted]d being seen today for ongoing prenatal care through Centering Pregnancy.  She is currently monitored for the following issues for this high-risk pregnancy and has Poor fetal growth affecting management of mother in third trimester; Migraine hx; History of prior pregnancy with IUGR newborn; Gestational hypertension; Hyperlipidemia; Supervision of high risk pregnancy, antepartum; Hyperthyroidism; and Anemia affecting pregnancy in third trimester on their problem list.  Patient reports no complaints.   .  .  Movement: Present. Denies leaking of fluid/ROM.   The following portions of the patient's history were reviewed and updated as appropriate: allergies, current medications, past family history, past medical history, past social history, past surgical history and problem list. Problem list updated.  Objective:   Vitals:   07/21/24 0947  BP: 119/77  Pulse: 90    Fetal Status: Fetal Heart Rate (bpm): 132 Fundal Height: 37 cm Movement: Present     General:  Alert, oriented and cooperative. Patient is in no acute distress.  Skin: Skin is warm and dry. No rash noted.   Cardiovascular: Normal heart rate noted  Respiratory: Normal respiratory effort, no problems with respiration noted  Abdomen: Soft, gravid, appropriate for gestational age.        Pelvic: Cervical exam deferred        Extremities: Normal range of motion.     Mental Status: Normal mood and affect. Normal behavior. Normal judgment and thought content.   Assessment and Plan:  Pregnancy: H3E5984 at [redacted]w[redacted]d  1. Poor fetal growth affecting management of mother in third trimester, single or unspecified fetus (Primary) - Consulted with Dr. Arna since patient missed dopplers on Monday due to 15 yo son being newly diagnosed with DMI (at appointment); okay to do NST today and dopplers as  scheduled on 10/8 - Fetal nonstress test > reactive  2. Gestational hypertension, third trimester - Fetal nonstress test  3. Supervision of high risk pregnancy, antepartum - Reviewed precautions  - Fetal nonstress test   Preterm labor symptoms and general obstetric precautions including but not limited to vaginal bleeding, contractions, leaking of fluid and fetal movement were reviewed in detail with the patient. Please refer to After Visit Summary for other counseling recommendations.   Centering Pregnancy, Session #8: Reviewed resources in mother's notebook regarding preparing for baby.     Facilitated discussion today:  Activities:  What's in the Basket?  Also reviewed importance of choosing pediatrician and family planning options.    Fundal height and FHR appropriate today unless noted otherwise in plan.   Return in about 1 week (around 07/28/2024) for Centering Group (Scheduled).  Future Appointments  Date Time Provider Department Center  07/26/2024  3:15 PM [facility] PROVIDER 1 [facility]  07/26/2024  3:30 PM [facility] US2 [facility]  07/28/2024  9:00 AM CENTERING PROVIDER [facility]  08/02/2024  3:15 PM [facility] PROVIDER 1 [facility]  08/02/2024  3:30 PM [facility] US2 [facility]  08/04/2024  9:00 AM CENTERING PROVIDER [facility]  08/10/2024  1:15 PM [facility] PROVIDER 1 [facility]  08/10/2024  1:30 PM [facility] US1 [facility]  08/15/2024  8:55 AM Nicholaus Burnard HERO, MD [facility]  08/22/2024  9:15 AM Regino Camie LABOR, CNM [facility]  11/13/2024 10:10 AM Shamleffer, Donell Cardinal, MD [facility] None    Lani LOISE Pouch, CNM

## 2024-07-24 ENCOUNTER — Encounter: Payer: Self-pay | Admitting: *Deleted

## 2024-07-26 ENCOUNTER — Ambulatory Visit

## 2024-07-28 ENCOUNTER — Other Ambulatory Visit (HOSPITAL_COMMUNITY)
Admission: RE | Admit: 2024-07-28 | Discharge: 2024-07-28 | Disposition: A | Discharge status: Home / Self Care | Source: Ambulatory Visit | Attending: Advanced Practice Midwife | Admitting: Advanced Practice Midwife

## 2024-07-28 ENCOUNTER — Ambulatory Visit (INDEPENDENT_AMBULATORY_CARE_PROVIDER_SITE_OTHER): Payer: Self-pay | Admitting: Advanced Practice Midwife

## 2024-07-28 ENCOUNTER — Encounter: Payer: Self-pay | Admitting: *Deleted

## 2024-07-28 VITALS — BP 123/80 | HR 91 | Wt 147.2 lb

## 2024-07-28 DIAGNOSIS — O0990 Supervision of high risk pregnancy, unspecified, unspecified trimester: Secondary | ICD-10-CM | POA: Insufficient documentation

## 2024-07-28 DIAGNOSIS — E0590 Thyrotoxicosis, unspecified without thyrotoxic crisis or storm: Secondary | ICD-10-CM | POA: Diagnosis not present

## 2024-07-28 DIAGNOSIS — Z3A35 35 weeks gestation of pregnancy: Secondary | ICD-10-CM | POA: Diagnosis not present

## 2024-07-28 DIAGNOSIS — O99013 Anemia complicating pregnancy, third trimester: Secondary | ICD-10-CM | POA: Diagnosis not present

## 2024-07-28 DIAGNOSIS — O0993 Supervision of high risk pregnancy, unspecified, third trimester: Secondary | ICD-10-CM

## 2024-07-28 DIAGNOSIS — O365930 Maternal care for other known or suspected poor fetal growth, third trimester, not applicable or unspecified: Secondary | ICD-10-CM | POA: Diagnosis not present

## 2024-07-28 NOTE — Progress Notes (Signed)
   PRENATAL VISIT NOTE  Subjective:  Courtney [name] is a 33 y.o. H3E5984 at [redacted]w[redacted]d being seen today for ongoing prenatal care.  She is currently monitored for the following issues for this high-risk pregnancy and has Poor fetal growth affecting management of mother in third trimester; Migraine hx; History of prior pregnancy with IUGR newborn; Gestational hypertension; Hyperlipidemia; Supervision of high risk pregnancy, antepartum; Hyperthyroidism; and Anemia affecting pregnancy in third trimester on their problem list.   Patient reports no complaints.  Contractions: Irregular. Vag. Bleeding: None.  Movement: Present. Denies leaking of fluid.   The following portions of the patient's history were reviewed and updated as appropriate: allergies, current medications, past family history, past medical history, past social history, past surgical history and problem list.   Objective:   Vitals:   07/28/24 0921  BP: 123/80  Pulse: 91  Weight: 147 lb 3.2 oz (66.8 kg)    Fetal Status: Fetal Heart Rate (bpm): 145 Fundal Height: 35 cm Movement: Present  Presentation: Vertex  General:  Alert, oriented and cooperative. Patient is in no acute distress.  Skin: Skin is warm and dry. No rash noted.   Cardiovascular: Normal heart rate noted  Respiratory: Normal respiratory effort, no problems with respiration noted  Abdomen: Soft, gravid, appropriate for gestational age.  Pain/Pressure: Present     Pelvic: Cervical exam deferred       Self swabs  Extremities: Normal range of motion.  Edema: None  Mental Status: Normal mood and affect. Normal behavior. Normal judgment and thought content.   Assessment and Plan:  Pregnancy: H3E5984 at [redacted]w[redacted]d 1. Hyperthyroidism (Primary) - Nml repeat labs  2. Poor fetal growth affecting management of mother in third trimester, single or unspecified fetus - Missed Dopplers. Growth US  and Dopplers scheduled 10/15 - NST today reactive.  - Growth US , dopplers scheduled  10/15 3. Supervision of high risk pregnancy, antepartum  4. Anemia affecting pregnancy in third trimester -  Preterm labor symptoms and general obstetric precautions including but not limited to vaginal bleeding, contractions, leaking of fluid and fetal movement were reviewed in detail with the patient. Please refer to After Visit Summary for other counseling recommendations.    Centering Pregnancy, Session#9: Reviewed resources in [employer].   Facilitated discussion today:   - Newborn care, safety and soothing techniques for infants - Safe sleep  Fundal height and FHR appropriate today unless noted otherwise in plan. Patient to continue group care.    No follow-ups on file.  Future Appointments  Date Time Provider Department Center  08/02/2024  3:15 PM [facility] PROVIDER 1 [facility]  08/02/2024  3:30 PM [facility] US2 [facility]  08/04/2024  9:00 AM CENTERING PROVIDER [facility]  08/10/2024  1:15 PM [facility] PROVIDER 1 [facility]  08/10/2024  1:30 PM [facility] US1 [facility]  08/15/2024  8:55 AM Nicholaus Burnard HERO, MD [facility]  08/22/2024  9:15 AM Regino Camie DELENA EDDY [facility]  11/13/2024 10:10 AM Shamleffer, Donell Cardinal, MD [facility] None    Markea Ruzich  Claudene, CNM [facility] for [facility]

## 2024-07-31 LAB — GC/CHLAMYDIA PROBE AMP (~~LOC~~) NOT AT ARMC
Chlamydia: NEGATIVE
Comment: NEGATIVE
Comment: NORMAL
Neisseria Gonorrhea: NEGATIVE

## 2024-08-01 LAB — CULTURE, BETA STREP (GROUP B ONLY): Strep Gp B Culture: NEGATIVE

## 2024-08-02 ENCOUNTER — Ambulatory Visit

## 2024-08-02 ENCOUNTER — Other Ambulatory Visit: Payer: Self-pay | Admitting: Obstetrics and Gynecology

## 2024-08-02 ENCOUNTER — Ambulatory Visit: Attending: Obstetrics and Gynecology | Admitting: Maternal & Fetal Medicine

## 2024-08-02 VITALS — BP 118/74

## 2024-08-02 DIAGNOSIS — O365930 Maternal care for other known or suspected poor fetal growth, third trimester, not applicable or unspecified: Secondary | ICD-10-CM

## 2024-08-02 DIAGNOSIS — Z3A36 36 weeks gestation of pregnancy: Secondary | ICD-10-CM | POA: Diagnosis not present

## 2024-08-02 DIAGNOSIS — O0990 Supervision of high risk pregnancy, unspecified, unspecified trimester: Secondary | ICD-10-CM

## 2024-08-02 DIAGNOSIS — O99013 Anemia complicating pregnancy, third trimester: Secondary | ICD-10-CM

## 2024-08-02 DIAGNOSIS — O09293 Supervision of pregnancy with other poor reproductive or obstetric history, third trimester: Secondary | ICD-10-CM | POA: Insufficient documentation

## 2024-08-02 DIAGNOSIS — Z362 Encounter for other antenatal screening follow-up: Secondary | ICD-10-CM | POA: Diagnosis not present

## 2024-08-02 NOTE — Progress Notes (Signed)
 Patient information  Patient Name: Courtney [name]  Patient MRN:   [id number]  Referring practice: MFM Referring Provider: [facility])  Problem List   Patient Active Problem List   Diagnosis Date Noted   Prior poor obstetrical history, antepartum, third trimester (Hx of GHTN) 08/02/2024   Anemia affecting pregnancy in third trimester 06/21/2024   Hyperthyroidism 03/17/2024   Supervision of high risk pregnancy, antepartum 01/12/2024   Hyperlipidemia 06/18/2023   History of prior pregnancy with IUGR newborn 10/11/2019   Poor fetal growth affecting management of mother in third trimester (resolved)     Maternal Fetal medicine Consult  ESTI DEMELLO is a 33 y.o. H3E5984 at [redacted]w[redacted]d here for ultrasound and consultation. Julanne Faubert is doing well today with no acute concerns. Today we focused on the following:   The patient had a growth ultrasound 5 months ago that showed fetal growth restriction.  She was unable to attend her next few ultrasounds but now is here today.  The estimated fetal weight is normal.  A BPP and UA Dopplers were also normal.  I discussed that since her growth is now normal she does not need future ultrasounds or antenatal testing unless other indications arise.  She reports good fetal movement.  She will follow-up with her OB providers.  The patient had time to ask questions that were answered to her satisfaction.  She verbalized understanding and agrees to proceed with the plan below.  Sonographic findings Single intrauterine pregnancy. Fetal cardiac activity: Observed. Presentation: Cephalic. Interval fetal anatomy appears normal. Fetal biometry shows the estimated fetal weight at the 32 percentile. Amniotic fluid: Within normal limits.  MVP: 5.74 cm. Placenta: Anterior. BPP: 8/8.   There are limitations of prenatal ultrasound such as the inability to detect certain abnormalities due to poor visualization. Various factors such  as fetal position, gestational age and maternal body habitus may increase the difficulty in visualizing the fetal anatomy.    Recommendations - No further ultrasounds are recommended at this time based on the current indications. If future indications arise (e.g. size/date discrepancy on fundal height, gestational diabetes or hypertension) and an ultrasound is to be desired at our MFM office, please send a referral.   Review of Systems: A review of systems was performed and was negative except per HPI   Vitals and Physical Exam    08/02/2024    3:13 PM 07/28/2024    9:21 AM 07/21/2024    9:47 AM  Vitals with BMI  Weight  147 lbs 3 oz 146 lbs 13 oz  BMI  26.92 26.84  Systolic 118 123 880  Diastolic 74 80 77  Pulse  91 90    Sitting comfortably on the sonogram table Nonlabored breathing Normal rate and rhythm Abdomen is nontender  Past pregnancies OB History  Gravida Para Term Preterm AB Living  6 4 4  0 1 5  SAB IAB Ectopic Multiple Live Births  1 0 0 1 5    # Outcome Date GA Lbr Len/2nd Weight Sex Type Anes PTL Lv  6 Current           5 Term 02/18/22 [redacted]w[redacted]d / 00:06 6 lb 12.6 oz (3.08 kg) F Vag-Spont EPI  LIV  4 Term 10/11/19 [redacted]w[redacted]d 00:31 / 00:12 5 lb 11.9 oz (2.605 kg) M Vag-Spont EPI  LIV  3A Term 03/20/18 [redacted]w[redacted]d / 00:11 5 lb 4 oz (2.38 kg) M Vag-Spont EPI  LIV  3B Term 03/20/18 [redacted]w[redacted]d /  00:15 5 lb 4.7 oz (2.4 kg) M Vag-Spont EPI  LIV  2 SAB 2018          1 Term 02/14/15 [redacted]w[redacted]d 02:47 / 00:09 5 lb 10.8 oz (2.575 kg) F Vag-Spont EPI  LIV     I spent 20 minutes reviewing the patients chart, including labs and images as well as counseling the patient about her medical conditions. Greater than 50% of the time was spent in direct face-to-face patient counseling.  Delora Smaller  MFM, [place]   08/02/2024  3:53 PM

## 2024-08-04 ENCOUNTER — Ambulatory Visit: Payer: Self-pay | Admitting: Family

## 2024-08-04 VITALS — BP 124/86 | HR 108 | Wt 150.2 lb

## 2024-08-04 DIAGNOSIS — O0993 Supervision of high risk pregnancy, unspecified, third trimester: Secondary | ICD-10-CM

## 2024-08-04 DIAGNOSIS — Z1332 Encounter for screening for maternal depression: Secondary | ICD-10-CM

## 2024-08-04 DIAGNOSIS — O0990 Supervision of high risk pregnancy, unspecified, unspecified trimester: Secondary | ICD-10-CM

## 2024-08-04 DIAGNOSIS — Z3A36 36 weeks gestation of pregnancy: Secondary | ICD-10-CM

## 2024-08-04 DIAGNOSIS — O09293 Supervision of pregnancy with other poor reproductive or obstetric history, third trimester: Secondary | ICD-10-CM | POA: Diagnosis not present

## 2024-08-04 NOTE — Progress Notes (Signed)
  [facility] PRENATAL VISIT NOTE- Centering Pregnancy Group #21  Subjective:  Courtney [name] is a 32 y.o. H3E5984 at [redacted]w[redacted]d being seen today for ongoing prenatal care through Centering Pregnancy.  She is currently monitored for the following issues for this high-risk pregnancy and has Poor fetal growth affecting management of mother in third trimester (resolved); History of prior pregnancy with IUGR newborn; Hyperlipidemia; Supervision of high risk pregnancy, antepartum; Hyperthyroidism; Anemia affecting pregnancy in third trimester; and Prior poor obstetrical history, antepartum, third trimester (Hx of GHTN) on their problem list.  Patient reports no complaints.   .  .  Movement: Present. Denies leaking of fluid/ROM.   The following portions of the patient's history were reviewed and updated as appropriate: allergies, current medications, past family history, past medical history, past social history, past surgical history and problem list. Problem list updated.  Objective:   Vitals:   08/04/24 0917  BP: 124/86  Pulse: (!) 108     Fetal Status: Fetal Heart Rate (bpm): 132 Fundal Height: 36 cm Movement: Present     General:  Alert, oriented and cooperative. Patient is in no acute distress.  Skin: Skin is warm and dry. No rash noted.   Cardiovascular: Normal heart rate noted  Respiratory: Normal respiratory effort, no problems with respiration noted  Abdomen: Soft, gravid, appropriate for gestational age.        Pelvic: Cervical exam deferred        Extremities: Normal range of motion.     Mental Status: Normal mood and affect. Normal behavior. Normal judgment and thought content.   Assessment and Plan:  Pregnancy: H3E5984 at [redacted]w[redacted]d  1. Supervision of high risk pregnancy, antepartum (Primary) - Continued closed monitoring  2. Prior poor obstetrical history, antepartum, third trimester (Hx of GHTN) - Monitor blood pressure   Preterm labor symptoms and general  obstetric precautions including but not limited to vaginal bleeding, contractions, leaking of fluid and fetal movement were reviewed in detail with the patient. Please refer to After Visit Summary for other counseling recommendations.   Centering Pregnancy, Session #10: Reviewed resources in mother's notebook regarding preparing for baby.     Facilitated discussion today:  Activities:  1. Stages of Labor  2.  Role Play - Postpartum .  Also reviewed importance of RSV/Flu, choosing pediatrician and family planning options.    Fundal height and FHR appropriate today unless noted otherwise in plan.   Return in about 1 week (around 08/11/2024) for Keep scheduled appointment. .  Future Appointments  Date Time Provider Department Center  08/15/2024  8:55 AM Nicholaus Burnard HERO, MD [facility]  08/22/2024  9:15 AM Regino Camie DELENA EDDY [facility]  11/13/2024 10:10 AM Shamleffer, Donell Cardinal, MD [facility] None    Lani LOISE Pouch, CNM

## 2024-08-04 NOTE — Patient Instructions (Signed)
 SABRA

## 2024-08-04 NOTE — Progress Notes (Signed)
Pregnancy risk form completed. Courtney [name]

## 2024-08-07 ENCOUNTER — Encounter: Payer: Self-pay | Admitting: *Deleted

## 2024-08-10 ENCOUNTER — Other Ambulatory Visit

## 2024-08-10 ENCOUNTER — Ambulatory Visit

## 2024-08-10 DIAGNOSIS — O0990 Supervision of high risk pregnancy, unspecified, unspecified trimester: Secondary | ICD-10-CM

## 2024-08-10 DIAGNOSIS — O99013 Anemia complicating pregnancy, third trimester: Secondary | ICD-10-CM

## 2024-08-15 ENCOUNTER — Ambulatory Visit (INDEPENDENT_AMBULATORY_CARE_PROVIDER_SITE_OTHER): Admitting: Family Medicine

## 2024-08-15 ENCOUNTER — Other Ambulatory Visit: Payer: Self-pay

## 2024-08-15 VITALS — BP 145/95 | HR 98 | Wt 148.0 lb

## 2024-08-15 DIAGNOSIS — O0990 Supervision of high risk pregnancy, unspecified, unspecified trimester: Secondary | ICD-10-CM

## 2024-08-15 DIAGNOSIS — O163 Unspecified maternal hypertension, third trimester: Secondary | ICD-10-CM

## 2024-08-15 DIAGNOSIS — O0993 Supervision of high risk pregnancy, unspecified, third trimester: Secondary | ICD-10-CM

## 2024-08-15 DIAGNOSIS — E0590 Thyrotoxicosis, unspecified without thyrotoxic crisis or storm: Secondary | ICD-10-CM

## 2024-08-15 DIAGNOSIS — Z3A38 38 weeks gestation of pregnancy: Secondary | ICD-10-CM

## 2024-08-15 NOTE — Progress Notes (Signed)
   PRENATAL VISIT NOTE  Subjective:  Courtney [name] is a 33 y.o. H3E5984 at [redacted]w[redacted]d being seen today for ongoing prenatal care.  She is currently monitored for the following issues for this low-risk pregnancy and has Poor fetal growth affecting management of mother in third trimester (resolved); History of prior pregnancy with IUGR newborn; Hyperlipidemia; Supervision of high risk pregnancy, antepartum; Hyperthyroidism; Anemia affecting pregnancy in third trimester; and Prior poor obstetrical history, antepartum, third trimester (Hx of GHTN) on their problem list.  Patient reports headache.  Contractions: Not present. Vag. Bleeding: None.  Movement: Present. Denies leaking of fluid.   The following portions of the patient's history were reviewed and updated as appropriate: allergies, current medications, past family history, past medical history, past social history, past surgical history and problem list.   Objective:    Vitals:   08/15/24 0915 08/15/24 0928  BP: (!) 134/98 (!) 145/95  Pulse: 93 98  Weight: 148 lb (67.1 kg)     Fetal Status:  Fetal Heart Rate (bpm): 138 Fundal Height: 38 cm Movement: Present Presentation: Vertex  General: Alert, oriented and cooperative. Patient is in no acute distress.  Skin: Skin is warm and dry. No rash noted.   Cardiovascular: Normal heart rate noted  Respiratory: Normal respiratory effort, no problems with respiration noted  Abdomen: Soft, gravid, appropriate for gestational age.  Pain/Pressure: Absent     Pelvic: Cervical exam performed in the presence of a chaperone Dilation: 3 Effacement (%): 50, 60 Station: -2  Extremities: Normal range of motion.  Edema: None  Mental Status: Normal mood and affect. Normal behavior. Normal judgment and thought content.   Assessment and Plan:  Pregnancy: H3E5984 at [redacted]w[redacted]d 1. [redacted] weeks gestation of pregnancy (Primary)   2. Supervision of high risk pregnancy, antepartum Continue prenatal care.  3.  Hyperthyroidism TFT's have been normal On no meds  4. Elevated blood pressure affecting pregnancy in third trimester, antepartum H/o GHTN in past Elevated x 2 today Try tylenol  for h/a, if does not improve come to [facility]U May not have child care until Thursday, warning signs reviewed Will check labs and consider IOL if elevated again - CBC - Protein / creatinine ratio, urine - Comprehensive metabolic panel with GFR  Preterm labor symptoms and general obstetric precautions including but not limited to vaginal bleeding, contractions, leaking of fluid and fetal movement were reviewed in detail with the patient. Please refer to After Visit Summary for other counseling recommendations.   Return in 2 days (on 08/17/2024) for blood pressure labs.  Future Appointments  Date Time Provider Department Center  08/22/2024  9:15 AM Regino Camie DELENA EDDY [facility]  11/13/2024 10:10 AM Shamleffer, Donell Cardinal, MD [facility] None    [name], MD

## 2024-08-16 ENCOUNTER — Ambulatory Visit: Payer: Self-pay | Admitting: Family Medicine

## 2024-08-16 LAB — COMPREHENSIVE METABOLIC PANEL WITH GFR
ALT: 6 IU/L (ref 0–32)
AST: 16 IU/L (ref 0–40)
Albumin: 3.7 g/dL — ABNORMAL LOW (ref 3.9–4.9)
Alkaline Phosphatase: 138 IU/L — ABNORMAL HIGH (ref 41–116)
BUN/Creatinine Ratio: 8 — ABNORMAL LOW (ref 9–23)
BUN: 5 mg/dL — ABNORMAL LOW (ref 6–20)
Bilirubin Total: 0.4 mg/dL (ref 0.0–1.2)
CO2: 20 mmol/L (ref 20–29)
Calcium: 8.7 mg/dL (ref 8.7–10.2)
Chloride: 102 mmol/L (ref 96–106)
Creatinine, Ser: 0.63 mg/dL (ref 0.57–1.00)
Globulin, Total: 3.6 g/dL (ref 1.5–4.5)
Glucose: 75 mg/dL (ref 70–99)
Potassium: 4.1 mmol/L (ref 3.5–5.2)
Sodium: 136 mmol/L (ref 134–144)
Total Protein: 7.3 g/dL (ref 6.0–8.5)
eGFR: 121 mL/min/1.73 (ref >59)

## 2024-08-16 LAB — PROTEIN / CREATININE RATIO, URINE
Creatinine, Urine: 176.5 mg/dL
Protein, Ur: 25.8 mg/dL
Protein/Creat Ratio: 146 mg/g{creat} (ref 0–200)

## 2024-08-16 LAB — CBC
Hematocrit: 32.4 % — ABNORMAL LOW (ref 34.0–46.6)
Hemoglobin: 10 g/dL — ABNORMAL LOW (ref 11.1–15.9)
MCH: 24 pg — ABNORMAL LOW (ref 26.6–33.0)
MCHC: 30.9 g/dL — ABNORMAL LOW (ref 31.5–35.7)
MCV: 78 fL — ABNORMAL LOW (ref 79–97)
Platelets: 284 x10E3/uL (ref 150–450)
RBC: 4.17 x10E6/uL (ref 3.77–5.28)
RDW: 15.3 % (ref 11.7–15.4)
WBC: 5.8 x10E3/uL (ref 3.4–10.8)

## 2024-08-16 NOTE — Telephone Encounter (Addendum)
 RN called pt to schedule BP Check nurse visit per Dr. Fredirick. Pt agreeable to nurse visit 08/17/24 at 09:40am.   Waddell, RN  ----- Message from Glenys GORMAN Fredirick sent at 08/16/2024  3:19 PM EDT ----- Needs BP check tomorrow. ----- Message ----- From: Rebecka Memos Lab Results In Sent: 08/16/2024  12:35 AM EDT To: Glenys GORMAN Fredirick, MD

## 2024-08-17 ENCOUNTER — Inpatient Hospital Stay (HOSPITAL_COMMUNITY): Admitting: Anesthesiology

## 2024-08-17 ENCOUNTER — Other Ambulatory Visit: Payer: Self-pay

## 2024-08-17 ENCOUNTER — Inpatient Hospital Stay (HOSPITAL_COMMUNITY)
Admission: AD | Admit: 2024-08-17 | Discharge: 2024-08-19 | DRG: 806 | Disposition: A | Discharge status: Home / Self Care | Attending: Obstetrics and Gynecology | Admitting: Obstetrics and Gynecology

## 2024-08-17 ENCOUNTER — Ambulatory Visit (INDEPENDENT_AMBULATORY_CARE_PROVIDER_SITE_OTHER): Admitting: *Deleted

## 2024-08-17 ENCOUNTER — Encounter (HOSPITAL_COMMUNITY): Payer: Self-pay | Admitting: Family Medicine

## 2024-08-17 ENCOUNTER — Other Ambulatory Visit: Payer: Self-pay | Admitting: Family Medicine

## 2024-08-17 VITALS — BP 138/85 | HR 108 | Ht 62.0 in | Wt 148.0 lb

## 2024-08-17 DIAGNOSIS — O9081 Anemia of the puerperium: Secondary | ICD-10-CM | POA: Diagnosis not present

## 2024-08-17 DIAGNOSIS — Z833 Family history of diabetes mellitus: Secondary | ICD-10-CM | POA: Diagnosis not present

## 2024-08-17 DIAGNOSIS — E785 Hyperlipidemia, unspecified: Secondary | ICD-10-CM | POA: Diagnosis present

## 2024-08-17 DIAGNOSIS — O99284 Endocrine, nutritional and metabolic diseases complicating childbirth: Secondary | ICD-10-CM | POA: Diagnosis present

## 2024-08-17 DIAGNOSIS — O134 Gestational [pregnancy-induced] hypertension without significant proteinuria, complicating childbirth: Principal | ICD-10-CM | POA: Diagnosis present

## 2024-08-17 DIAGNOSIS — Z8249 Family history of ischemic heart disease and other diseases of the circulatory system: Secondary | ICD-10-CM | POA: Diagnosis not present

## 2024-08-17 DIAGNOSIS — O99013 Anemia complicating pregnancy, third trimester: Secondary | ICD-10-CM | POA: Diagnosis present

## 2024-08-17 DIAGNOSIS — O365930 Maternal care for other known or suspected poor fetal growth, third trimester, not applicable or unspecified: Secondary | ICD-10-CM | POA: Diagnosis present

## 2024-08-17 DIAGNOSIS — Z8759 Personal history of other complications of pregnancy, childbirth and the puerperium: Secondary | ICD-10-CM

## 2024-08-17 DIAGNOSIS — O09293 Supervision of pregnancy with other poor reproductive or obstetric history, third trimester: Secondary | ICD-10-CM

## 2024-08-17 DIAGNOSIS — O139 Gestational [pregnancy-induced] hypertension without significant proteinuria, unspecified trimester: Principal | ICD-10-CM | POA: Diagnosis present

## 2024-08-17 DIAGNOSIS — Z3A38 38 weeks gestation of pregnancy: Secondary | ICD-10-CM

## 2024-08-17 DIAGNOSIS — E0590 Thyrotoxicosis, unspecified without thyrotoxic crisis or storm: Secondary | ICD-10-CM | POA: Diagnosis present

## 2024-08-17 DIAGNOSIS — Z7982 Long term (current) use of aspirin: Secondary | ICD-10-CM

## 2024-08-17 DIAGNOSIS — D62 Acute posthemorrhagic anemia: Secondary | ICD-10-CM | POA: Diagnosis not present

## 2024-08-17 DIAGNOSIS — Z0130 Encounter for examination of blood pressure without abnormal findings: Secondary | ICD-10-CM

## 2024-08-17 DIAGNOSIS — R030 Elevated blood-pressure reading, without diagnosis of hypertension: Secondary | ICD-10-CM | POA: Diagnosis present

## 2024-08-17 DIAGNOSIS — G9741 Accidental puncture or laceration of dura during a procedure: Secondary | ICD-10-CM | POA: Diagnosis not present

## 2024-08-17 DIAGNOSIS — Y838 Other surgical procedures as the cause of abnormal reaction of the patient, or of later complication, without mention of misadventure at the time of the procedure: Secondary | ICD-10-CM | POA: Diagnosis not present

## 2024-08-17 DIAGNOSIS — O163 Unspecified maternal hypertension, third trimester: Secondary | ICD-10-CM

## 2024-08-17 LAB — COMPREHENSIVE METABOLIC PANEL WITH GFR
ALT: 10 U/L (ref 0–44)
AST: 17 U/L (ref 15–41)
Albumin: 2.7 g/dL — ABNORMAL LOW (ref 3.5–5.0)
Alkaline Phosphatase: 112 U/L (ref 38–126)
Anion gap: 10 (ref 5–15)
BUN: 6 mg/dL (ref 6–20)
CO2: 19 mmol/L — ABNORMAL LOW (ref 22–32)
Calcium: 8.4 mg/dL — ABNORMAL LOW (ref 8.9–10.3)
Chloride: 105 mmol/L (ref 98–111)
Creatinine, Ser: 0.54 mg/dL (ref 0.44–1.00)
GFR, Estimated: >60 mL/min (ref >60)
Glucose, Bld: 91 mg/dL (ref 70–99)
Potassium: 3.5 mmol/L (ref 3.5–5.1)
Sodium: 134 mmol/L — ABNORMAL LOW (ref 135–145)
Total Bilirubin: 0.6 mg/dL (ref 0.0–1.2)
Total Protein: 7.1 g/dL (ref 6.5–8.1)

## 2024-08-17 LAB — TYPE AND SCREEN
ABO/RH(D): O POS
Antibody Screen: NEGATIVE

## 2024-08-17 LAB — CBC
HCT: 29.3 % — ABNORMAL LOW (ref 36.0–46.0)
Hemoglobin: 9.3 g/dL — ABNORMAL LOW (ref 12.0–15.0)
MCH: 24 pg — ABNORMAL LOW (ref 26.0–34.0)
MCHC: 31.7 g/dL (ref 30.0–36.0)
MCV: 75.7 fL — ABNORMAL LOW (ref 80.0–100.0)
Platelets: 285 K/uL (ref 150–400)
RBC: 3.87 MIL/uL (ref 3.87–5.11)
RDW: 15.2 % (ref 11.5–15.5)
WBC: 7.9 K/uL (ref 4.0–10.5)
nRBC: 0 % (ref 0.0–0.2)

## 2024-08-17 LAB — PROTEIN / CREATININE RATIO, URINE
Creatinine, Urine: 65 mg/dL
Protein Creatinine Ratio: 0.17 mg/mg{creat} — ABNORMAL HIGH (ref 0.00–0.15)
Total Protein, Urine: 11 mg/dL

## 2024-08-17 MED ORDER — DIPHENHYDRAMINE HCL 50 MG/ML IJ SOLN: 12.5000 mg | INTRAMUSCULAR | Status: DC | PRN

## 2024-08-17 MED ORDER — OXYCODONE-ACETAMINOPHEN 5-325 MG PO TABS: 2.0000 | ORAL_TABLET | ORAL | Status: DC | PRN

## 2024-08-17 MED ORDER — PHENYLEPHRINE 80 MCG/ML (10ML) SYRINGE FOR IV PUSH (FOR BLOOD PRESSURE SUPPORT): 80.0000 ug | PREFILLED_SYRINGE | INTRAVENOUS | Status: DC | PRN

## 2024-08-17 MED ORDER — LABETALOL HCL 5 MG/ML IV SOLN: 80.0000 mg | INTRAVENOUS | Status: DC | PRN

## 2024-08-17 MED ORDER — BENZOCAINE-MENTHOL 20-0.5 % EX AERO
1.0000 | INHALATION_SPRAY | CUTANEOUS | Status: DC | PRN
  Administered 2024-08-18: 1 via TOPICAL
  Filled 2024-08-17: qty 56

## 2024-08-17 MED ORDER — PRENATAL MULTIVITAMIN CH
1.0000 | ORAL_TABLET | Freq: Every day | ORAL | Status: DC
Start: 2024-08-18 — End: 2024-08-19
  Administered 2024-08-18 – 2024-08-19 (×2): 1 via ORAL
  Filled 2024-08-17 (×2): qty 1

## 2024-08-17 MED ORDER — OXYCODONE-ACETAMINOPHEN 5-325 MG PO TABS: 1.0000 | ORAL_TABLET | ORAL | Status: DC | PRN

## 2024-08-17 MED ORDER — ONDANSETRON HCL 4 MG PO TABS: 4.0000 mg | ORAL_TABLET | ORAL | Status: DC | PRN

## 2024-08-17 MED ORDER — OXYCODONE HCL 5 MG PO TABS: 10.0000 mg | ORAL_TABLET | ORAL | Status: DC | PRN

## 2024-08-17 MED ORDER — LIDOCAINE HCL (PF) 1 % IJ SOLN
INTRAMUSCULAR | Status: DC | PRN
  Administered 2024-08-17: 5 mL via EPIDURAL

## 2024-08-17 MED ORDER — POTASSIUM CHLORIDE CRYS ER 20 MEQ PO TBCR
20.0000 meq | EXTENDED_RELEASE_TABLET | Freq: Every day | ORAL | Status: DC
  Administered 2024-08-18 – 2024-08-19 (×2): 20 meq via ORAL
  Filled 2024-08-17 (×2): qty 1

## 2024-08-17 MED ORDER — SOD CITRATE-CITRIC ACID 500-334 MG/5ML PO SOLN
30.0000 mL | ORAL | Status: DC | PRN
Start: 2024-08-17 — End: 2024-08-17

## 2024-08-17 MED ORDER — ACETAMINOPHEN 325 MG PO TABS
650.0000 mg | ORAL_TABLET | ORAL | Status: DC | PRN
  Filled 2024-08-17: qty 2

## 2024-08-17 MED ORDER — OXYTOCIN BOLUS FROM INFUSION
333.0000 mL | Freq: Once | INTRAVENOUS | Status: AC
Start: 2024-08-17 — End: 2024-08-17
  Administered 2024-08-17: 333 mL via INTRAVENOUS

## 2024-08-17 MED ORDER — ACETAMINOPHEN 325 MG PO TABS
650.0000 mg | ORAL_TABLET | ORAL | Status: DC | PRN
Start: 2024-08-17 — End: 2024-08-17
  Filled 2024-08-17: qty 2

## 2024-08-17 MED ORDER — LABETALOL HCL 5 MG/ML IV SOLN: 20.0000 mg | INTRAVENOUS | Status: DC | PRN

## 2024-08-17 MED ORDER — WITCH HAZEL-GLYCERIN EX PADS: 1.0000 | MEDICATED_PAD | CUTANEOUS | Status: DC | PRN

## 2024-08-17 MED ORDER — HYDRALAZINE HCL 20 MG/ML IJ SOLN: 10.0000 mg | INTRAMUSCULAR | Status: DC | PRN

## 2024-08-17 MED ORDER — NIFEDIPINE ER OSMOTIC RELEASE 30 MG PO TB24
30.0000 mg | ORAL_TABLET | Freq: Every day | ORAL | Status: DC
  Administered 2024-08-17 – 2024-08-19 (×3): 30 mg via ORAL
  Filled 2024-08-17 (×3): qty 1

## 2024-08-17 MED ORDER — ONDANSETRON HCL 4 MG/2ML IJ SOLN: 4.0000 mg | Freq: Four times a day (QID) | INTRAMUSCULAR | Status: DC | PRN

## 2024-08-17 MED ORDER — OXYTOCIN-SODIUM CHLORIDE 30-0.9 UT/500ML-% IV SOLN: 1.0000 m[IU]/min | INTRAVENOUS | Status: DC

## 2024-08-17 MED ORDER — EPHEDRINE 5 MG/ML INJ: 10.0000 mg | INTRAVENOUS | Status: DC | PRN

## 2024-08-17 MED ORDER — SIMETHICONE 80 MG PO CHEW: 80.0000 mg | CHEWABLE_TABLET | ORAL | Status: DC | PRN

## 2024-08-17 MED ORDER — FENTANYL-BUPIVACAINE-NACL 0.5-0.125-0.9 MG/250ML-% EP SOLN
12.0000 mL/h | EPIDURAL | Status: DC | PRN
  Administered 2024-08-17: 12 mL/h via EPIDURAL
  Filled 2024-08-17: qty 250

## 2024-08-17 MED ORDER — DIBUCAINE (PERIANAL) 1 % EX OINT: 1.0000 | TOPICAL_OINTMENT | CUTANEOUS | Status: DC | PRN

## 2024-08-17 MED ORDER — DIPHENHYDRAMINE HCL 25 MG PO CAPS: 25.0000 mg | ORAL_CAPSULE | Freq: Four times a day (QID) | ORAL | Status: DC | PRN

## 2024-08-17 MED ORDER — LIDOCAINE HCL (PF) 1 % IJ SOLN
30.0000 mL | INTRAMUSCULAR | Status: AC | PRN
Start: 2024-08-17 — End: 2024-08-17
  Administered 2024-08-17: 30 mL via SUBCUTANEOUS
  Filled 2024-08-17: qty 30

## 2024-08-17 MED ORDER — MISOPROSTOL 25 MCG QUARTER TABLET
25.0000 ug | ORAL_TABLET | Freq: Once | ORAL | Status: DC
Start: 2024-08-17 — End: 2024-08-17

## 2024-08-17 MED ORDER — LACTATED RINGERS IV SOLN: 500.0000 mL | Freq: Once | INTRAVENOUS | Status: DC

## 2024-08-17 MED ORDER — LACTATED RINGERS IV SOLN: INTRAVENOUS | Status: DC

## 2024-08-17 MED ORDER — LACTATED RINGERS IV SOLN: 500.0000 mL | INTRAVENOUS | Status: DC | PRN

## 2024-08-17 MED ORDER — MISOPROSTOL 50MCG HALF TABLET
50.0000 ug | ORAL_TABLET | Freq: Once | ORAL | Status: DC
Start: 2024-08-17 — End: 2024-08-17

## 2024-08-17 MED ORDER — TERBUTALINE SULFATE 1 MG/ML IJ SOLN: 0.2500 mg | Freq: Once | INTRAMUSCULAR | Status: DC | PRN

## 2024-08-17 MED ORDER — LABETALOL HCL 5 MG/ML IV SOLN: 40.0000 mg | INTRAVENOUS | Status: DC | PRN

## 2024-08-17 MED ORDER — FUROSEMIDE 20 MG PO TABS
20.0000 mg | ORAL_TABLET | Freq: Every day | ORAL | Status: DC
Start: 2024-08-18 — End: 2024-08-19
  Administered 2024-08-18 – 2024-08-19 (×2): 20 mg via ORAL
  Filled 2024-08-17 (×2): qty 1

## 2024-08-17 MED ORDER — TERBUTALINE SULFATE 1 MG/ML IJ SOLN
0.2500 mg | Freq: Once | INTRAMUSCULAR | Status: DC | PRN
Start: 2024-08-17 — End: 2024-08-17
  Filled 2024-08-17: qty 1

## 2024-08-17 MED ORDER — SENNOSIDES-DOCUSATE SODIUM 8.6-50 MG PO TABS
2.0000 | ORAL_TABLET | Freq: Every day | ORAL | Status: DC
  Administered 2024-08-18 – 2024-08-19 (×2): 2 via ORAL
  Filled 2024-08-17 (×2): qty 2

## 2024-08-17 MED ORDER — ONDANSETRON HCL 4 MG/2ML IJ SOLN: 4.0000 mg | INTRAMUSCULAR | Status: DC | PRN

## 2024-08-17 MED ORDER — IBUPROFEN 600 MG PO TABS
600.0000 mg | ORAL_TABLET | Freq: Four times a day (QID) | ORAL | Status: DC
  Administered 2024-08-17 – 2024-08-19 (×7): 600 mg via ORAL
  Filled 2024-08-17 (×7): qty 1

## 2024-08-17 MED ORDER — COCONUT OIL OIL
1.0000 | TOPICAL_OIL | Status: DC | PRN
  Administered 2024-08-18: 1 via TOPICAL

## 2024-08-17 MED ORDER — OXYTOCIN-SODIUM CHLORIDE 30-0.9 UT/500ML-% IV SOLN
2.5000 [IU]/h | INTRAVENOUS | Status: DC
  Filled 2024-08-17: qty 500

## 2024-08-17 MED ORDER — OXYCODONE HCL 5 MG PO TABS: 5.0000 mg | ORAL_TABLET | ORAL | Status: DC | PRN

## 2024-08-17 NOTE — Anesthesia Procedure Notes (Signed)
 Epidural Patient location during procedure: OB Start time: 08/17/2024 4:08 PM End time: 08/17/2024 4:24 PM  Staffing Anesthesiologist: Jefm Garnette LABOR, MD Performed: anesthesiologist   Preanesthetic Checklist Completed: patient identified, IV checked, site marked, risks and benefits discussed, surgical consent, monitors and equipment checked, pre-op evaluation and timeout performed  Epidural Patient position: sitting Prep: DuraPrep and site prepped and draped Patient monitoring: continuous pulse ox and blood pressure Approach: midline Location: L2-L3 Injection technique: LOR air  Needle:  Needle type: Tuohy  Needle gauge: 17 G Needle length: 9 cm and 9 Needle insertion depth: 5 cm cm Catheter type: closed end flexible Catheter size: 19 Gauge Catheter at skin depth: 11 cm Test dose: negative  Assessment Events: blood not aspirated, cerebrospinal fluid, injection not painful, no injection resistance, no paresthesia and negative IV test  Additional Notes Patient identified. Risks/Benefits/Options discussed with patient including but not limited to bleeding, infection, nerve damage, paralysis, failed block, incomplete pain control, headache, blood pressure changes, nausea, vomiting, reactions to medication both or allergic, itching and postpartum back pain. Confirmed with bedside nurse the patient's most recent platelet count. Confirmed with patient that they are not currently taking any anticoagulation, have any bleeding history or any family history of bleeding disorders. Patient expressed understanding and wished to proceed. All questions were answered. Sterile technique was used throughout the entire procedure. Please see nursing notes for vital signs. Test dose was given through epidural needle and negative prior to continuing to dose epidural or start infusion. Warning signs of high block given to the patient including shortness of breath, tingling/numbness in hands, complete  motor block, or any concerning symptoms with instructions to call for help. Patient was given instructions on fall risk and not to get out of bed. All questions and concerns addressed with instructions to call with any issues.  1st attempt unable to thread catheter further attempt to advance the neele resulted is + csf. Needle withdran 2nd attemtp at L2-L3  Attempt (S) . Patient tolerated procedure well.

## 2024-08-17 NOTE — Discharge Summary (Signed)
 Postpartum Discharge Summary     Patient Name: Courtney [name] DOB: Oct 24, 1990 MRN: [id number]  Date of admission: 08/17/2024 Delivery date:08/17/2024 Delivering provider: DANNY GERALDS Date of discharge: 08/19/2024  Admitting diagnosis: Gestational hypertension [O13.9] Intrauterine pregnancy: [redacted]w[redacted]d     Secondary diagnosis:  Principal Problem:   Gestational hypertension Active Problems:   Poor fetal growth affecting management of mother in third trimester (resolved)   Hyperthyroidism   Anemia affecting pregnancy in third trimester   History of gestational hypertension  Additional problems: N/A    Discharge diagnosis: Term Pregnancy Delivered and Gestational Hypertension                                              Post partum procedures:None Augmentation: None Complications: Dural puncture with epidural  Hospital course: Induction of Labor With Vaginal Delivery   33 y.o. yo H3E4983 at [redacted]w[redacted]d was admitted to the hospital 08/17/2024 for induction of labor.  Indication for induction: Gestational hypertension.  Patient had an labor course complicated by wet tap and epidural complications. Membrane Rupture Time/Date: 4:46 PM,08/17/2024  Delivery Method:Vaginal, Spontaneous Operative Delivery:N/A Episiotomy: None Lacerations:  None Details of delivery can be found in separate delivery note.    Patient had an uncomplicated PP course. Patient is discharged home 08/19/24.  gHTN: Started on Nifedipine  30mg  XR while in the hospital. Has been normotensive. Will continue outpatient. Rx sent to patient pharmacy. Will continue lasix  and Kcl supplementation to finish 5 day course. Follow up in 1 week for BP check.  Dural puncture: Inadvertent during epidural placement. Largely asymptomatic. Seen by anesthesia PP who recommended PO hydration and increased caffeine intake.  Anemia: Discharge Hgb 9.0. Continue PO iron. Oral iron therapy started for clinically significant postpartum anemia due  to expected blood loss after delivery.    Newborn Data: Birth date:08/17/2024 Birth time:6:48 PM Gender:Female Living status:Living Apgars:9 ,9  Weight:3120 g  Magnesium Sulfate received: No BMZ received: No Rhophylac:N/A MMR:N/A T-DaP:Given prenatally Flu: No, declined prenatally RSV Vaccine received: No Transfusion:No  Immunizations received: Immunization History  Administered Date(s) Administered   Tdap 01/14/2018, 08/29/2019, 11/13/2021, 06/23/2024    Physical exam  Vitals:   08/18/24 0944 08/18/24 1500 08/18/24 2025 08/19/24 0534  BP: 123/88 121/85 128/83 117/85  Pulse: 94 89 87 81  Resp: 18 18 18 18   Temp: 98.2 F (36.8 C) 98.2 F (36.8 C) 98.7 F (37.1 C) 98.1 F (36.7 C)  TempSrc: Axillary Oral Oral Oral  SpO2: 99% 99% 100% 100%   General: alert, cooperative, and no distress Lochia: appropriate Uterine Fundus: firm Incision: N/A DVT Evaluation: No evidence of DVT seen on physical exam.  Labs: Lab Results  Component Value Date   WBC 13.1 (H) 08/18/2024   HGB 9.0 (L) 08/18/2024   HCT 28.4 (L) 08/18/2024   MCV 76.8 (L) 08/18/2024   PLT 245 08/18/2024      Latest Ref Rng & Units 08/17/2024    3:06 PM  CMP  Glucose 70 - 99 mg/dL 91   BUN 6 - 20 mg/dL 6   Creatinine 9.55 - 8.99 mg/dL 9.45   Sodium 864 - 854 mmol/L 134   Potassium 3.5 - 5.1 mmol/L 3.5   Chloride 98 - 111 mmol/L 105   CO2 22 - 32 mmol/L 19   Calcium 8.9 - 10.3 mg/dL 8.4   Total Protein 6.5 - 8.1  g/dL 7.1   Total Bilirubin 0.0 - 1.2 mg/dL 0.6   Alkaline Phos 38 - 126 U/L 112   AST 15 - 41 U/L 17   ALT 0 - 44 U/L 10    Edinburgh Score:    08/18/2024    7:44 AM  Edinburgh Postnatal Depression Scale Screening Tool  I have been able to laugh and see the funny side of things. 0  I have looked forward with enjoyment to things. 0  I have blamed myself unnecessarily when things went wrong. 2  I have been anxious or worried for no good reason. 2  I have felt scared or panicky for  no good reason. 1  Things have been getting on top of me. 1  I have been so unhappy that I have had difficulty sleeping. 0  I have felt sad or miserable. 0  I have been so unhappy that I have been crying. 1  The thought of harming myself has occurred to me. 0  Edinburgh Postnatal Depression Scale Total 7   Edinburgh Postnatal Depression Scale Total: 7   After visit meds:  Allergies as of 08/19/2024   No Known Allergies      Medication List     STOP taking these medications    aspirin  EC 81 MG tablet   ferrous gluconate  324 MG tablet Commonly known as: FERGON       TAKE these medications    ACCRUFeR  30 MG Caps Generic drug: Ferric Maltol  Take 1 capsule (30 mg total) by mouth 2 (two) times daily.   acetaminophen  325 MG tablet Commonly known as: Tylenol  Take 2 tablets (650 mg total) by mouth every 4 (four) hours as needed (for pain scale < 4).   furosemide  20 MG tablet Commonly known as: LASIX  Take 1 tablet (20 mg total) by mouth daily for 4 days.   ibuprofen  600 MG tablet Commonly known as: ADVIL  Take 1 tablet (600 mg total) by mouth every 6 (six) hours.   NIFEdipine  30 MG 24 hr tablet Commonly known as: ADALAT  CC Take 1 tablet (30 mg total) by mouth daily.   potassium chloride SA 20 MEQ tablet Commonly known as: KLOR-CON M Take 1 tablet (20 mEq total) by mouth daily.   prenatal vitamin w/FE, FA 27-1 MG Tabs tablet Take 1 tablet by mouth daily at 12 noon.   senna-docusate 8.6-50 MG tablet Commonly known as: Senokot-S Take 2 tablets by mouth daily.         Discharge home in stable condition Infant Feeding: Bottle and Breast Infant Disposition:home with mother Discharge instruction: per After Visit Summary and Postpartum booklet. Activity: Advance as tolerated. Pelvic rest for 6 weeks.  Diet: routine diet Future Appointments: Future Appointments  Date Time Provider Department Center  08/25/2024 10:00 AM [facility] NURSE [facility]  09/29/2024  10:15 AM Claudene Litter , CNM [facility]  11/13/2024 10:10 AM Shamleffer, Donell Cardinal, MD [facility] None   Follow up Visit:  Follow-up Information     Center for [facility] Follow up.   Specialty: Obstetrics and Gynecology Why: for postpartum visits. Contact Dr. Cresenzo for consult about vasectomy. Contact information: [address] [place]  [address] [phone]                Please schedule this patient for a In person postpartum visit in 6 weeks with the following provider: Any provider. Additional Postpartum F/U:BP check 1 week  High risk pregnancy complicated by: HTN  Delivery mode:  Vaginal, Spontaneous Anticipated Birth Control:  Nexplanon OP   08/19/2024 Dominik Lauricella LITTIE Angles, MD

## 2024-08-17 NOTE — Progress Notes (Signed)
 LABOR PROGRESS NOTE  In to the room at 1740 for decel. Arrived approx minute 1-2.  Patient had dural puncture during epidural placement, has been reporting dizziness, CP, and HA. Anesthesia at bedside as well.   SCE: 7cm  After spontaneous return to baseline: baseline 140, mod variability, +accels, late decels; overall category II.  A/P:  CTM, hopeful for labor progression and vagdel   Hamp Moreland, DO 5:57 PM

## 2024-08-17 NOTE — Progress Notes (Signed)
 Pt @ 38.5 wks presents for BP check. She denies H/A or visual disturbances. Initial BP - 131/101, P - 122.   Repeat BP after 20 minutes rest - 138/85, P - 108. Per consult with Dr. Lola, pt was advised to go to hospital for IOL direct admit. Pt states she needs to get her children settled @ home with family member and will go to hospital within a few hours.

## 2024-08-17 NOTE — H&P (Signed)
 LABOR AND DELIVERY ADMISSION HISTORY AND PHYSICAL NOTE  Courtney [name] is a 33 y.o. female [phone] with IUP at [redacted]w[redacted]d by 7 wk u/s presenting for elevated bp. At clinic 2 days ago and again today. No ha, vision change, upper abd pain, dyspnea.   She reports positive fetal movement. She denies leakage of fluid or vaginal bleeding. Mild contractions starting yesterday  Prenatal History/Complications:  Past Medical History: Past Medical History:  Diagnosis Date   Anemia    Anxiety 10/11/2019   Contraception management 03/30/2022   Elevated blood pressure reading 06/18/2023   Gestational hypertension 02/18/2022   Migraine    Migraine hx 10/11/2019   Pap smear for cervical cancer screening 03/30/2022   Postpartum care following vaginal delivery 03/30/2022   SVD (12/23) 10/12/2019   Trichomonal vaginitis during pregnancy    Vaginal delivery 02/18/2022    Past Surgical History: Past Surgical History:  Procedure Laterality Date   NO PAST SURGERIES     WISDOM TOOTH EXTRACTION      Obstetrical History: OB History     Gravida  6   Para  4   Term  4   Preterm  0   AB  1   Living  5      SAB  1   IAB  0   Ectopic  0   Multiple  1   Live Births  5           Social History: Social History   Socioeconomic History   Marital status: Married    Spouse name: Airlie Blumenberg   Number of children: Not on file   Years of education: Not on file   Highest education level: 12th grade  Occupational History   Not on file  Tobacco Use   Smoking status: Never   Smokeless tobacco: Never  Vaping Use   Vaping status: Never Used  Substance and Sexual Activity   Alcohol use: No   Drug use: No   Sexual activity: Yes    Birth control/protection: None  Other Topics Concern   Not on file  Social History Narrative   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    Married    Social Drivers of Corporate Investment Banker Strain: Low Risk  (11/12/2023)   Overall Financial Resource Strain  (CARDIA)    Difficulty of Paying Living Expenses: Not hard at all  Food Insecurity: Unknown (08/17/2024)   Hunger Vital Sign    Worried About Programme Researcher, Broadcasting/film/video in the Last Year: Not on file    [employer] of Food in the Last Year: Never true  Transportation Needs: No Transportation Needs (08/17/2024)   PRAPARE - Administrator, Civil Service (Medical): No    Lack of Transportation (Non-Medical): No  Physical Activity: Unknown (11/12/2023)   Exercise Vital Sign    Days of Exercise per Week: 0 days    Minutes of Exercise per Session: Not on file  Stress: Stress Concern Present (11/12/2023)   [employer] of Occupational Health - Occupational Stress Questionnaire    Feeling of Stress : Rather much  Social Connections: Socially Integrated (08/17/2024)   Social Connection and Isolation Panel    Frequency of Communication with Friends and Family: Twice a week    Frequency of Social Gatherings with Friends and Family: Three times a week    Attends Religious Services: More than 4 times per year    Active Member of Clubs or Organizations: Yes  Attends Banker Meetings: Never    Marital Status: Married    Family History: Family History  Problem Relation Age of Onset   Diabetes Mother    Multiple sclerosis Mother    Fibromyalgia Mother    Kidney disease Father    Hypertension Father    Diabetes Maternal Uncle    Diabetes Maternal Grandmother    Cancer Paternal Grandfather        lung   Autism Paternal Uncle        half-uncle ( husbands brother)    Hypertension Maternal Grandfather    Heart attack Maternal Grandfather    Stroke Maternal Grandfather    Hypertension Paternal Grandmother    Stroke Paternal Grandmother    Heart disease Paternal Grandmother     Allergies: No Known Allergies  Medications Prior to Admission  Medication Sig Dispense Refill Last Dose/Taking   aspirin  EC 81 MG tablet Take 1 tablet (81 mg total) by mouth daily. Swallow whole.  30 tablet 12    Ferric Maltol  (ACCRUFER ) 30 MG CAPS Take 1 capsule (30 mg total) by mouth 2 (two) times daily. 60 capsule 1    ferrous gluconate  (FERGON) 324 MG tablet Take 1 tablet (324 mg total) by mouth every other day. (Patient not taking: Reported on 08/15/2024) 30 tablet 2    prenatal vitamin w/FE, FA (PRENATAL 1 + 1) 27-1 MG TABS tablet Take 1 tablet by mouth daily at 12 noon.        Review of Systems   All systems reviewed and negative except as stated in HPI  Blood pressure (!) 133/93, pulse 85, temperature 98.5 F (36.9 C), temperature source Oral, resp. rate 18, currently breastfeeding. General appearance: alert, cooperative, and appears stated age Lungs: clear to auscultation bilaterally Heart: regular rate and rhythm Abdomen: soft, non-tender; bowel sounds normal Extremities: No calf swelling or tenderness Presentation: cephalic per rn exam Fetal monitoring: cat 1 Uterine activity: q 2-4 Dilation: 5 Effacement (%): 80 Station: -2 Exam by:: Waddell Leaver, RN   Prenatal labs: ABO, Rh: --/--/PENDING (10/30 1506) Antibody: PENDING (10/30 1506) Rubella: 10.00 (04/14 1109) RPR: Non Reactive (08/22 0917)  HBsAg: Negative (04/14 1109)  HIV: Non Reactive (08/22 0917)  GBS: Negative/-- (10/10 1217)  2 hr Glucola: wnl Genetic screening:  wnl Anatomy US : wnl  Prenatal Transfer Tool  Maternal Diabetes: No Genetic Screening: Normal Maternal Ultrasounds/Referrals: iugr (resolved) Fetal Ultrasounds or other Referrals:  None Maternal Substance Abuse:  No Significant Maternal Medications:  None Significant Maternal Lab Results: Group B Strep negative  Results for orders placed or performed during the hospital encounter of 08/17/24 (from the past 24 hours)  CBC   Collection Time: 08/17/24  3:06 PM  Result Value Ref Range   WBC 7.9 4.0 - 10.5 K/uL   RBC 3.87 3.87 - 5.11 MIL/uL   Hemoglobin 9.3 (L) 12.0 - 15.0 g/dL   HCT 70.6 (L) 63.9 - 53.9 %   MCV 75.7 (L) 80.0 -  100.0 fL   MCH 24.0 (L) 26.0 - 34.0 pg   MCHC 31.7 30.0 - 36.0 g/dL   RDW 84.7 88.4 - 84.4 %   Platelets 285 150 - 400 K/uL   nRBC 0.0 0.0 - 0.2 %  Type and screen   Collection Time: 08/17/24  3:06 PM  Result Value Ref Range   ABO/RH(D) PENDING    Antibody Screen PENDING    Sample Expiration      08/20/2024,2359 Performed at [facility], [address] [place], [place] [address]     Patient Active Problem List   Diagnosis Date Noted   History of gestational hypertension 08/17/2024   Prior poor obstetrical history, antepartum, third trimester (Hx of GHTN) 08/02/2024   Anemia affecting pregnancy in third trimester 06/21/2024   Hyperthyroidism 03/17/2024   Supervision of high risk pregnancy, antepartum 01/12/2024   Hyperlipidemia 06/18/2023   Gestational hypertension 02/18/2022   History of prior pregnancy with IUGR newborn 10/11/2019   Poor fetal growth affecting management of mother in third trimester (resolved)     Assessment: Courtney [name] is a 34 y.o. H3E5984 at [redacted]w[redacted]d here for IOL for new mild ghtn.  # ghtn: mild, asymptomatic, labs pending  # FGR: resolved, efw 32% w/ normal dopplers most recent scan  # Subclinical hyperthyroid: likely 2/2 pregnancy, would advise TFTs 6 weeks postpartum  #Labor: 5 cm on rn check, will plan epidural then pit then arom, patient reports fast labors after rom #Pain: epidural #FWB: Cat 1 #ID:  Gbs neg no s/s/ infection #MOF: breast/bottle #MOC: undecided considering bts vs nexplanon vs. iud #Circ:  Yes undecided whether inpatient  Devaughn B Africa Masaki 08/17/2024, 4:00 PM

## 2024-08-17 NOTE — Anesthesia Preprocedure Evaluation (Signed)
 Anesthesia Evaluation  Patient identified by MRN, date of birth, ID band Patient awake    Reviewed: Allergy & Precautions, NPO status , Patient's Chart, lab work & pertinent test results  Airway Mallampati: II  TM Distance: >3 FB Neck ROM: Full    Dental no notable dental hx.    Pulmonary    Pulmonary exam normal breath sounds clear to auscultation       Cardiovascular hypertension (gHtn), Pt. on medications Normal cardiovascular exam Rhythm:Regular Rate:Normal     Neuro/Psych  Headaches  Anxiety        GI/Hepatic negative GI ROS, Neg liver ROS,,,  Endo/Other   Hyperthyroidism   Renal/GU negative Renal ROS     Musculoskeletal   Abdominal   Peds  Hematology  (+) Blood dyscrasia, anemia Lab Results      Component                Value               Date                      WBC                      7.9                 08/17/2024                HGB                      9.3 (L)             08/17/2024                HCT                      29.3 (L)            08/17/2024                MCV                      75.7 (L)            08/17/2024                PLT                      285                 08/17/2024              Anesthesia Other Findings   Reproductive/Obstetrics (+) Pregnancy                              Anesthesia Physical Anesthesia Plan  ASA: 2  Anesthesia Plan: Epidural   Post-op Pain Management:    Induction:   PONV Risk Score and Plan:   Airway Management Planned:   Additional Equipment:   Intra-op Plan:   Post-operative Plan:   Informed Consent: I have reviewed the patients History and Physical, chart, labs and discussed the procedure including the risks, benefits and alternatives for the proposed anesthesia with the patient or authorized representative who has indicated his/her understanding and acceptance.       Plan Discussed with:   Anesthesia  Plan Comments: (38.5 wk G6P5 w gHtn,  anemia and Hyperthyroid for LEA)         Anesthesia Quick Evaluation

## 2024-08-18 LAB — CBC
HCT: 28.4 % — ABNORMAL LOW (ref 36.0–46.0)
Hemoglobin: 9 g/dL — ABNORMAL LOW (ref 12.0–15.0)
MCH: 24.3 pg — ABNORMAL LOW (ref 26.0–34.0)
MCHC: 31.7 g/dL (ref 30.0–36.0)
MCV: 76.8 fL — ABNORMAL LOW (ref 80.0–100.0)
Platelets: 245 K/uL (ref 150–400)
RBC: 3.7 MIL/uL — ABNORMAL LOW (ref 3.87–5.11)
RDW: 15 % (ref 11.5–15.5)
WBC: 13.1 K/uL — ABNORMAL HIGH (ref 4.0–10.5)
nRBC: 0 % (ref 0.0–0.2)

## 2024-08-18 LAB — RPR: RPR Ser Ql: NONREACTIVE

## 2024-08-18 LAB — BIRTH TISSUE RECOVERY COLLECTION (PLACENTA DONATION)

## 2024-08-18 NOTE — Progress Notes (Signed)
 MOB was referred for history of depression/anxiety.  * Referral screened out by Clinical Social Worker because none of the following criteria appear to apply:  ~ History of anxiety/depression during this pregnancy, or of post-partum depression following prior delivery.  ~ Diagnosis of anxiety and/or depression within last 3 years  Per OB notes, MOB did not indicate any signs/symptoms during her pregnancy.  Edinburgh score 7  Anxiety/Depression 2013  OR  * MOB's symptoms currently being treated with medication and/or therapy.  Please contact the Clinical Social Worker if needs arise, by [facility] request, or if MOB scores greater than 9/yes to question 10 on Edinburgh Postpartum Depression Screen.  Rosina Molt, [place] Clinical Social Worker [phone]

## 2024-08-18 NOTE — Anesthesia Postprocedure Evaluation (Signed)
 Anesthesia Post Note  Patient: Courtney [name]  Procedure(s) Performed: AN AD HOC LABOR EPIDURAL     Patient location during evaluation: Mother Baby Anesthesia Type: Epidural Level of consciousness: awake Pain management: satisfactory to patient Vital Signs Assessment: post-procedure vital signs reviewed and stable Respiratory status: spontaneous breathing Cardiovascular status: stable Anesthetic complications: no   No notable events documented.  Last Vitals:  Vitals:   08/18/24 0201 08/18/24 0544  BP: (!) 128/92 115/87  Pulse: 85 89  Resp: 17 18  Temp: 36.7 C 36.8 C  SpO2: 100% 99%    Last Pain:  Vitals:   08/18/24 0744  TempSrc:   PainSc: 3    Pain Goal:                Epidural/Spinal Function Cutaneous sensation: Normal sensation (08/18/24 0744), Patient able to flex knees: Yes (08/18/24 0744), Patient able to lift hips off bed: Yes (08/18/24 0744), Back pain beyond tenderness at insertion site: No (08/18/24 0744), Progressively worsening motor and/or sensory loss: No (08/18/24 0744), Bowel and/or bladder incontinence post epidural: No (08/18/24 0744)  JEANENNE HANDING

## 2024-08-18 NOTE — Patient Instructions (Signed)

## 2024-08-18 NOTE — Lactation Note (Signed)
 This note was copied from a baby's chart. Lactation Consultation Note  Patient Name: Boy Kalleigh Harbor Unijb'd Date: 08/18/2024 Age:32 hours Reason for consult: Initial assessment;Early term 37-38.6wks  P5. Experienced BF mom stated this baby is BF great having no issues or concerns. Mom is doing BF/formula because he is already wanting to cluster feed.  Reviewed mom on newborn feeding habits, and behavior. Mom encouraged to feed baby 8-12 times/24 hours and with feeding cues.  Encouraged mom if she has questions or needs assistance to call.  Maternal Data Has patient been taught Hand Expression?: No Does the patient have breastfeeding experience prior to this delivery?: Yes How long did the patient breastfeed?: 1st-33yrs old pumped 8 months, 2nd-33yr old twins 2 months Breast/formula, 3rd-33yr old BF 6 months, 4th-2 1/32yrs old BF 2 yrs. Stopped Jan.  Feeding    LATCH Score Latch: Grasps breast easily, tongue down, lips flanged, rhythmical sucking.  Audible Swallowing: Spontaneous and intermittent  Type of Nipple: Everted at rest and after stimulation  Comfort (Breast/Nipple): Soft / non-tender  Hold (Positioning): No assistance needed to correctly position infant at breast.  LATCH Score: 10   Lactation Tools Discussed/Used    Interventions    Discharge Pump: DEBP  Consult Status Consult Status: Complete    Ramanda Paules G 08/18/2024, 12:20 AM

## 2024-08-18 NOTE — Progress Notes (Signed)
 Pt seen and evaluated s/p inadvertent dural puncture during epidural placement yesterday (delivery about 16h ago). One mild (2/10) HA overnight, but otherwise comfortable. Seen sitting up at 90 degree angle in bed currently. D/w pt would like to leave epidural catheter in place as long as possible, however she expressed a desire to shower later this afternoon. We will see her around 2pm and if still asymptomatic remove epidural catheter at that time so that she can shower without risk of infection. D/w her maintaining good PO hydration and as much caffeine as possible. All questions answered. Communicated plan with RN [employer].

## 2024-08-18 NOTE — Progress Notes (Signed)
 Pt seen again sitting up in bed with baby, c/o no pain. Has been up and ambulating without headache. Epidural removed per patient request so she can shower. Instructed her to have nursing team call anesthesia if a headache develops. Encouraged PO hydration. Will evaluate again tomorrow prior to discharge.

## 2024-08-18 NOTE — Progress Notes (Signed)
 POSTPARTUM PROGRESS NOTE Postpartum Day 1  Subjective: Presley Gora is a 33 y.o. H3E4983 s/p SVD at [redacted]w[redacted]d.  She reports she is doing well. No acute events overnight. She denies any problems with ambulating, voiding or po intake. Denies nausea or vomiting.  Pain is well controlled.  Lochia is appropriate. BP have been mild range. She feels well, no complaints.  Objective: Blood pressure 115/87, pulse 89, temperature 98.3 F (36.8 C), temperature source Oral, resp. rate 18, SpO2 99%, unknown if currently breastfeeding.  Physical Exam:  General: alert, cooperative and no distress Heart:regular rate Resp: nonlabored Uterine Fundus: firm, appropriately tender Extremities:  none edema Skin: warm, dry  Recent Labs    08/17/24 1506 08/18/24 0419  HGB 9.3* 9.0*  HCT 29.3* 28.4*    Assessment/Plan: Malvika Tung is a 33 y.o. H3E4983 s/p SVD at [redacted]w[redacted]d. She presented for IOL, was 5cm on arrival, spontaneously ROM shortly thereafter, and spontaneous progression.   PPD#1 - meeting milestones - continue routine postpartum care - po iron q2d   gHTN - Lasix  + K x5d  Dural puncture - anesthesia to evaluate today   Feeding: breast Contraception: undecided LARC vs patient/partner sterilization   Dispo: Plan for discharge PPD2.  LOS: 1 day   Barabara Maier, DO [facility] [employer]

## 2024-08-19 MED ORDER — FUROSEMIDE 20 MG PO TABS: 20.0000 mg | ORAL_TABLET | Freq: Every day | ORAL | 0 refills | Status: DC

## 2024-08-19 MED ORDER — ACETAMINOPHEN 325 MG PO TABS: 650.0000 mg | ORAL_TABLET | ORAL | Status: DC | PRN

## 2024-08-19 MED ORDER — IBUPROFEN 600 MG PO TABS: 600.0000 mg | ORAL_TABLET | Freq: Four times a day (QID) | ORAL | 0 refills | Status: DC

## 2024-08-19 MED ORDER — SENNOSIDES-DOCUSATE SODIUM 8.6-50 MG PO TABS: 2.0000 | ORAL_TABLET | Freq: Every day | ORAL | 0 refills | Status: DC

## 2024-08-19 MED ORDER — NIFEDIPINE ER 30 MG PO TB24: 30.0000 mg | ORAL_TABLET | Freq: Every day | ORAL | 3 refills | Status: DC

## 2024-08-19 MED ORDER — POTASSIUM CHLORIDE CRYS ER 20 MEQ PO TBCR: 20.0000 meq | EXTENDED_RELEASE_TABLET | Freq: Every day | ORAL | 0 refills | Status: DC

## 2024-08-19 NOTE — Lactation Note (Signed)
 This note was copied from a baby's chart. Lactation Consultation Note  Patient Name: Courtney [name] Unijb'd Date: 08/19/2024 Age:32 hours Reason for consult: Follow-up assessment;Maternal discharge;Infant weight loss (change in weight -1.44% to -4.33%)  P6, MOB informed LC infant was in [address] for his circumcision, infant has been breastfeeding for 10 minutes, MOB will work towards increasing his duration at the breast on Day 2 of life. MOB feeding choice is breast and bottle feeding. LC set MOB up with using the DEBP she wants to supplement infant with more of her EBM than formula. LC fitted MOB with size 24 mm breast flange and MOB was expressing colostrum in flanges as LC left the room. MOB plans to latch infant when he comes back from [place] and then offer him her EBM first before formula. MOB knows on Day 2 of life infant may be cluster feeding as well and this is normal pattern of behavior. LC discussed importance of rest, meals and hydration.   MOB knows that her EBM is safe for 4 hours at room temperature whereas , formula RTF is only safe for 1 hour. MOB has handout on Breast milk storage and collection.    Feeding Plan- Day 2 1- MOB will continue to breastfeed infant by cues, on demand, 8-12 times within 24 hours, skin to skin. 2- Day 2 MOB will work on increasing infant time at the breast greater than 10 minutes. LC discussed skin to skin. 3-Using DEBP at her discretion  Discharge education: 1- LC discussed engorgement prevention and treatment. 2-How to know if breastfeeding is going well with infant. 3- LC discussed warning signs of dehydration in infant.  3- At [facility] discretion she will always latch infant first and then offer amy EBM first and then formula with latch ( 7-12 mls) per feeding. Maternal Data Has patient been taught Hand Expression?: Yes Does the patient have breastfeeding experience prior to this delivery?: Yes How long did the patient  breastfeed?: MOB previously breastfeed 5 children for 1 year.  Feeding Mother's Current Feeding Choice: Breast Milk and Formula  LATCH Score                    Lactation Tools Discussed/Used Tools: Flanges;Pump Flange Size: 24 Breast pump type: Double-Electric Breast Pump Pump Education: Setup, frequency, and cleaning;Milk Storage Reason for Pumping: MOB want to offer her own EBM and supplement with less formula. Pumping frequency: Pump every 3 horus using DEBP at her discretion. Pumped volume: 4 mL (MOB was still expressing colostrum when LC left the room.)  Interventions Interventions: Skin to skin;Position options;Expressed milk;DEBP;Education;CDC milk storage guidelines;CDC Guidelines for Breast Pump Cleaning;Guidelines for Milk Supply and Pumping Schedule Handout;LC Services brochure  Discharge Discharge Education: Engorgement and breast care;Warning signs for feeding baby Pump: DEBP (MOB will be receiving her DEBP from her insurance company today.)  Consult Status Consult Status: Complete Date: 08/19/24 Follow-up type: Physician    Grayce LULLA Batter 08/19/2024, 10:10 AM

## 2024-08-20 ENCOUNTER — Emergency Department (HOSPITAL_BASED_OUTPATIENT_CLINIC_OR_DEPARTMENT_OTHER)

## 2024-08-20 ENCOUNTER — Observation Stay (HOSPITAL_BASED_OUTPATIENT_CLINIC_OR_DEPARTMENT_OTHER)
Admission: EM | Admit: 2024-08-20 | Discharge: 2024-08-22 | Disposition: A | Discharge status: Home / Self Care | Attending: Obstetrics and Gynecology | Admitting: Obstetrics and Gynecology

## 2024-08-20 ENCOUNTER — Encounter (HOSPITAL_BASED_OUTPATIENT_CLINIC_OR_DEPARTMENT_OTHER): Payer: Self-pay

## 2024-08-20 ENCOUNTER — Other Ambulatory Visit: Payer: Self-pay

## 2024-08-20 DIAGNOSIS — O864 Pyrexia of unknown origin following delivery: Secondary | ICD-10-CM | POA: Diagnosis present

## 2024-08-20 DIAGNOSIS — O8604 Sepsis following an obstetrical procedure: Secondary | ICD-10-CM | POA: Diagnosis not present

## 2024-08-20 DIAGNOSIS — B9789 Other viral agents as the cause of diseases classified elsewhere: Secondary | ICD-10-CM | POA: Diagnosis not present

## 2024-08-20 DIAGNOSIS — Z743 Need for continuous supervision: Secondary | ICD-10-CM | POA: Diagnosis not present

## 2024-08-20 DIAGNOSIS — R509 Fever, unspecified: Secondary | ICD-10-CM

## 2024-08-20 DIAGNOSIS — N3001 Acute cystitis with hematuria: Secondary | ICD-10-CM | POA: Diagnosis present

## 2024-08-20 DIAGNOSIS — Z1152 Encounter for screening for COVID-19: Secondary | ICD-10-CM | POA: Insufficient documentation

## 2024-08-20 DIAGNOSIS — A419 Sepsis, unspecified organism: Principal | ICD-10-CM | POA: Diagnosis present

## 2024-08-20 DIAGNOSIS — O8622 Infection of bladder following delivery: Secondary | ICD-10-CM | POA: Diagnosis not present

## 2024-08-20 DIAGNOSIS — R531 Weakness: Secondary | ICD-10-CM | POA: Diagnosis not present

## 2024-08-20 LAB — COMPREHENSIVE METABOLIC PANEL WITH GFR
ALT: 9 U/L (ref 0–44)
AST: 25 U/L (ref 15–41)
Albumin: 3.8 g/dL (ref 3.5–5.0)
Alkaline Phosphatase: 135 U/L — ABNORMAL HIGH (ref 38–126)
Anion gap: 12 (ref 5–15)
BUN: 7 mg/dL (ref 6–20)
CO2: 24 mmol/L (ref 22–32)
Calcium: 9.4 mg/dL (ref 8.9–10.3)
Chloride: 102 mmol/L (ref 98–111)
Creatinine, Ser: 0.66 mg/dL (ref 0.44–1.00)
GFR, Estimated: >60 mL/min (ref >60)
Glucose, Bld: 107 mg/dL — ABNORMAL HIGH (ref 70–99)
Potassium: 3.7 mmol/L (ref 3.5–5.1)
Sodium: 137 mmol/L (ref 135–145)
Total Bilirubin: 0.3 mg/dL (ref 0.0–1.2)
Total Protein: 7.9 g/dL (ref 6.5–8.1)

## 2024-08-20 LAB — PROTIME-INR
INR: 0.9 (ref 0.8–1.2)
Prothrombin Time: 12.7 s (ref 11.4–15.2)

## 2024-08-20 LAB — CBC WITH DIFFERENTIAL/PLATELET
Abs Immature Granulocytes: 0.06 K/uL (ref 0.00–0.07)
Basophils Absolute: 0 K/uL (ref 0.0–0.1)
Basophils Relative: 0 %
Eosinophils Absolute: 0.1 K/uL (ref 0.0–0.5)
Eosinophils Relative: 1 %
HCT: 33.4 % — ABNORMAL LOW (ref 36.0–46.0)
Hemoglobin: 10.4 g/dL — ABNORMAL LOW (ref 12.0–15.0)
Immature Granulocytes: 1 %
Lymphocytes Relative: 13 %
Lymphs Abs: 1.2 K/uL (ref 0.7–4.0)
MCH: 23.8 pg — ABNORMAL LOW (ref 26.0–34.0)
MCHC: 31.1 g/dL (ref 30.0–36.0)
MCV: 76.4 fL — ABNORMAL LOW (ref 80.0–100.0)
Monocytes Absolute: 0.8 K/uL (ref 0.1–1.0)
Monocytes Relative: 8 %
Neutro Abs: 7 K/uL (ref 1.7–7.7)
Neutrophils Relative %: 77 %
Platelets: 281 K/uL (ref 150–400)
RBC: 4.37 MIL/uL (ref 3.87–5.11)
RDW: 15.5 % (ref 11.5–15.5)
WBC: 9.2 K/uL (ref 4.0–10.5)
nRBC: 0.2 % (ref 0.0–0.2)

## 2024-08-20 LAB — URINALYSIS, W/ REFLEX TO CULTURE (INFECTION SUSPECTED)
Bacteria, UA: NONE SEEN
Bilirubin Urine: NEGATIVE
Glucose, UA: NEGATIVE mg/dL
Ketones, ur: NEGATIVE mg/dL
Nitrite: NEGATIVE
RBC / HPF: >50 RBC/hpf (ref 0–5)
Specific Gravity, Urine: 1.016 (ref 1.005–1.030)
pH: 7 (ref 5.0–8.0)

## 2024-08-20 LAB — RESP PANEL BY RT-PCR (RSV, FLU A&B, COVID)  RVPGX2
Influenza A by PCR: NEGATIVE
Influenza B by PCR: NEGATIVE
Resp Syncytial Virus by PCR: NEGATIVE
SARS Coronavirus 2 by RT PCR: NEGATIVE

## 2024-08-20 LAB — LACTIC ACID, PLASMA
Lactic Acid, Venous: 1.3 mmol/L (ref 0.5–1.9)
Lactic Acid, Venous: 1 mmol/L (ref 0.5–1.9)

## 2024-08-20 LAB — TSH: TSH: 0.648 u[IU]/mL (ref 0.350–4.500)

## 2024-08-20 MED ORDER — DIBUCAINE (PERIANAL) 1 % EX OINT: 1.0000 | TOPICAL_OINTMENT | CUTANEOUS | Status: DC | PRN

## 2024-08-20 MED ORDER — SODIUM CHLORIDE 0.9 % IV SOLN
1.0000 g | Freq: Once | INTRAVENOUS | Status: AC
  Administered 2024-08-20: 1 g via INTRAVENOUS
  Filled 2024-08-20: qty 10

## 2024-08-20 MED ORDER — ENOXAPARIN SODIUM 40 MG/0.4ML IJ SOSY
40.0000 mg | PREFILLED_SYRINGE | INTRAMUSCULAR | Status: DC
  Administered 2024-08-20 – 2024-08-21 (×2): 40 mg via SUBCUTANEOUS
  Filled 2024-08-20 (×2): qty 0.4

## 2024-08-20 MED ORDER — IBUPROFEN 600 MG PO TABS
600.0000 mg | ORAL_TABLET | Freq: Four times a day (QID) | ORAL | Status: DC | PRN
  Administered 2024-08-22: 600 mg via ORAL
  Filled 2024-08-20: qty 1

## 2024-08-20 MED ORDER — ONDANSETRON HCL 4 MG PO TABS: 4.0000 mg | ORAL_TABLET | ORAL | Status: DC | PRN

## 2024-08-20 MED ORDER — FUROSEMIDE 20 MG PO TABS
20.0000 mg | ORAL_TABLET | Freq: Every day | ORAL | Status: DC
  Administered 2024-08-21 – 2024-08-22 (×2): 20 mg via ORAL
  Filled 2024-08-20 (×3): qty 1

## 2024-08-20 MED ORDER — ZOLPIDEM TARTRATE 5 MG PO TABS: 5.0000 mg | ORAL_TABLET | Freq: Every evening | ORAL | Status: DC | PRN

## 2024-08-20 MED ORDER — COCONUT OIL OIL: 1.0000 | TOPICAL_OIL | Status: DC | PRN

## 2024-08-20 MED ORDER — WITCH HAZEL-GLYCERIN EX PADS: 1.0000 | MEDICATED_PAD | CUTANEOUS | Status: DC | PRN

## 2024-08-20 MED ORDER — SIMETHICONE 80 MG PO CHEW: 80.0000 mg | CHEWABLE_TABLET | ORAL | Status: DC | PRN

## 2024-08-20 MED ORDER — POTASSIUM CHLORIDE CRYS ER 20 MEQ PO TBCR
20.0000 meq | EXTENDED_RELEASE_TABLET | Freq: Every day | ORAL | Status: DC
  Administered 2024-08-21: 20 meq via ORAL
  Filled 2024-08-20 (×2): qty 1

## 2024-08-20 MED ORDER — SENNOSIDES-DOCUSATE SODIUM 8.6-50 MG PO TABS: 2.0000 | ORAL_TABLET | Freq: Every evening | ORAL | Status: DC | PRN

## 2024-08-20 MED ORDER — ONDANSETRON HCL 4 MG/2ML IJ SOLN: 4.0000 mg | INTRAMUSCULAR | Status: DC | PRN

## 2024-08-20 MED ORDER — LABETALOL HCL 200 MG PO TABS
300.0000 mg | ORAL_TABLET | Freq: Two times a day (BID) | ORAL | Status: DC
Start: 2024-08-20 — End: 2024-08-20

## 2024-08-20 MED ORDER — FUROSEMIDE 20 MG PO TABS
20.0000 mg | ORAL_TABLET | Freq: Every day | ORAL | Status: DC
Start: 2024-08-20 — End: 2024-08-20

## 2024-08-20 MED ORDER — ACETAMINOPHEN 325 MG PO TABS
650.0000 mg | ORAL_TABLET | Freq: Once | ORAL | Status: AC
Start: 2024-08-20 — End: 2024-08-20
  Administered 2024-08-20: 650 mg via ORAL
  Filled 2024-08-20: qty 2

## 2024-08-20 MED ORDER — POTASSIUM CHLORIDE CRYS ER 20 MEQ PO TBCR
20.0000 meq | EXTENDED_RELEASE_TABLET | Freq: Every day | ORAL | Status: DC
Start: 2024-08-20 — End: 2024-08-20

## 2024-08-20 MED ORDER — FUROSEMIDE 10 MG/ML IJ SOLN
20.0000 mg | Freq: Once | INTRAMUSCULAR | Status: DC
Start: 2024-08-20 — End: 2024-08-20

## 2024-08-20 MED ORDER — NIFEDIPINE ER OSMOTIC RELEASE 30 MG PO TB24
30.0000 mg | ORAL_TABLET | Freq: Every day | ORAL | Status: DC
  Administered 2024-08-21 – 2024-08-22 (×2): 30 mg via ORAL
  Filled 2024-08-20 (×2): qty 1

## 2024-08-20 MED ORDER — BENZOCAINE-MENTHOL 20-0.5 % EX AERO: 1.0000 | INHALATION_SPRAY | CUTANEOUS | Status: DC | PRN

## 2024-08-20 MED ORDER — SODIUM CHLORIDE 0.9 % IV BOLUS
1000.0000 mL | Freq: Once | INTRAVENOUS | Status: AC
  Administered 2024-08-20: 1000 mL via INTRAVENOUS

## 2024-08-20 MED ORDER — FUROSEMIDE 20 MG PO TABS
20.0000 mg | ORAL_TABLET | Freq: Every day | ORAL | Status: DC
Start: 2024-08-21 — End: 2024-08-20

## 2024-08-20 MED ORDER — LABETALOL HCL 200 MG PO TABS: 300.0000 mg | ORAL_TABLET | Freq: Two times a day (BID) | ORAL | Status: DC

## 2024-08-20 MED ORDER — DIPHENHYDRAMINE HCL 25 MG PO CAPS: 25.0000 mg | ORAL_CAPSULE | Freq: Four times a day (QID) | ORAL | Status: DC | PRN

## 2024-08-20 MED ORDER — ACETAMINOPHEN 325 MG PO TABS
650.0000 mg | ORAL_TABLET | ORAL | Status: DC | PRN
  Administered 2024-08-21 – 2024-08-22 (×2): 650 mg via ORAL
  Filled 2024-08-20 (×2): qty 2

## 2024-08-20 NOTE — ED Provider Notes (Addendum)
 [place] EMERGENCY DEPARTMENT AT [facility] Provider Note   CSN: [id number] Arrival date & time: 08/20/24  1714     Patient presents with: Fever   Courtney [name] is a 33 y.o. female.   Patient is a 33yo female G78P6 with pmh of gestational hypertension, delivered healthy boy at 38 weeks and 5 days on 10/30, 3 days ago, presenting with fever, body aches, and headache. Temperature 100.5 F and tachycardic at 127 on arrival to ED. Denies abdominal, pelvic, or vaginal pain. Admits to nasal congestion and rhinorrhea intermittently in the hospital that she attributed to her allergies. None currently. No rashes. States she had epidural complication. Chart review demonstrates she had an epidural that was attempted twice and successful on the second try. She denies spinal pain, skin rash, back pain, skin color changes near the epidural site, or neck pain. She states she had a cold two weeks ago. She sates when she was in the labor and delivery unit she had rhinorrhea and nasal congestion that improved immediately upon leaving so she attributed these symptoms to allergies. She denies current nasal congestion, rhinorrhea, sore throat, or cough. She denies abdominal pain, pelvic pain, abnormal discharge, nausea/vomiting. She states she I still having postpartum bleeding and states that she has some vaginal odor. No vaginal discharge seen.   The history is provided by the patient. No language interpreter was used.  Fever Associated symptoms: no chest pain, no chills, no cough, no dysuria, no ear pain, no rash, no sore throat and no vomiting        Prior to Admission medications   Medication Sig Start Date End Date Taking? Authorizing Provider  acetaminophen  (TYLENOL ) 325 MG tablet Take 2 tablets (650 mg total) by mouth every 4 (four) hours as needed (for pain scale < 4). 08/19/24   Cashion, Colter L, MD  amoxicillin-clavulanate (AUGMENTIN) 875-125 MG tablet Take 1 tablet by mouth every 12  (twelve) hours. 08/22/24   Jayne Vonn DEL, MD  Ferric Maltol  (ACCRUFER ) 30 MG CAPS Take 1 capsule (30 mg total) by mouth 2 (two) times daily. Patient not taking: Reported on 08/28/2024 06/23/24   Teresia Rakers N, CNM  furosemide  (LASIX ) 20 MG tablet Take 1 tablet (20 mg total) by mouth daily for 4 days. 08/19/24 08/23/24  Cashion, Colter L, MD  ibuprofen  (ADVIL ) 600 MG tablet Take 1 tablet (600 mg total) by mouth every 6 (six) hours. Patient not taking: Reported on 08/28/2024 08/19/24   Cashion, Colter L, MD  NIFEdipine  (ADALAT  CC) 30 MG 24 hr tablet Take 1 tablet (30 mg total) by mouth daily. 08/22/24   Jayne Vonn DEL, MD  nystatin  cream (MYCOSTATIN ) Apply thin layer to nipples after each breastfeeding or pumping session. Continue to use for 2 weeks after all symptoms are gone. 08/28/24   Delores Nidia CROME, FNP  potassium chloride SA (KLOR-CON M) 20 MEQ tablet Take 1 tablet (20 mEq total) by mouth daily. 08/19/24   Cashion, Colter L, MD  prenatal vitamin w/FE, FA (PRENATAL 1 + 1) 27-1 MG TABS tablet Take 1 tablet by mouth daily at 12 noon. Patient not taking: Reported on 08/28/2024    [provider]  senna-docusate (SENOKOT-S) 8.6-50 MG tablet Take 2 tablets by mouth daily. 08/19/24   Cashion, Colter L, MD    Allergies: Patient has no known allergies.    Review of Systems  Constitutional:  Positive for fever. Negative for chills.  HENT:  Negative for ear pain and sore throat.  Eyes:  Negative for pain and visual disturbance.  Respiratory:  Negative for cough and shortness of breath.   Cardiovascular:  Negative for chest pain and palpitations.  Gastrointestinal:  Negative for abdominal pain and vomiting.  Genitourinary:  Positive for vaginal bleeding. Negative for dysuria, hematuria, pelvic pain and vaginal discharge.  Musculoskeletal:  Negative for arthralgias and back pain.  Skin:  Negative for color change and rash.  Neurological:  Negative for seizures and syncope.  All  other systems reviewed and are negative.   Updated Vital Signs BP 128/86 (BP Location: Left Arm)   Pulse 90   Temp 98.6 F (37 C) (Oral)   Resp 16   Ht 5' 2 (1.575 m)   Wt 65.3 kg   LMP  (Approximate) Comment: pt states menstrual cycle has not started since after last pregnancy.  SpO2 100%   Breastfeeding Yes   BMI 26.34 kg/m   Physical Exam Vitals and nursing note reviewed.  Constitutional:      General: She is not in acute distress.    Appearance: She is well-developed.  HENT:     Head: Normocephalic and atraumatic.  Eyes:     Conjunctiva/sclera: Conjunctivae normal.  Cardiovascular:     Rate and Rhythm: Regular rhythm. Tachycardia present.     Heart sounds: No murmur heard. Pulmonary:     Effort: Pulmonary effort is normal. No respiratory distress.     Breath sounds: Normal breath sounds.  Chest:     Comments: Breast are engorged and warm. No local tenderness. No nipple tenderness. No erythema.  Abdominal:     Palpations: Abdomen is soft.     Tenderness: There is no abdominal tenderness. There is no guarding or rebound.  Musculoskeletal:        General: No swelling.     Cervical back: Neck supple.  Skin:    General: Skin is warm and dry.     Capillary Refill: Capillary refill takes less than 2 seconds.  Neurological:     Mental Status: She is alert and oriented to person, place, and time.     GCS: GCS eye subscore is 4. GCS verbal subscore is 5. GCS motor subscore is 6.  Psychiatric:        Mood and Affect: Mood normal.     (all labs ordered are listed, but only abnormal results are displayed) Labs Reviewed  URINE CULTURE - Abnormal; Notable for the following components:      Result Value   Culture   (*)    Value: <10,000 COLONIES/mL INSIGNIFICANT GROWTH Performed at [facility], [address], [place], [place] [address]    All other components within normal limits  COMPREHENSIVE METABOLIC PANEL WITH GFR - Abnormal; Notable for the following  components:   Glucose, Bld 107 (*)    Alkaline Phosphatase 135 (*)    All other components within normal limits  CBC WITH DIFFERENTIAL/PLATELET - Abnormal; Notable for the following components:   Hemoglobin 10.4 (*)    HCT 33.4 (*)    MCV 76.4 (*)    MCH 23.8 (*)    All other components within normal limits  URINALYSIS, W/ REFLEX TO CULTURE (INFECTION SUSPECTED) - Abnormal; Notable for the following components:   Hgb urine dipstick LARGE (*)    Protein, ur TRACE (*)    Leukocytes,Ua LARGE (*)    All other components within normal limits  CBC - Abnormal; Notable for the following components:   Hemoglobin 9.5 (*)  HCT 30.7 (*)    MCV 76.8 (*)    MCH 23.8 (*)    RDW 15.6 (*)    All other components within normal limits  COMPREHENSIVE METABOLIC PANEL WITH GFR - Abnormal; Notable for the following components:   Potassium 3.1 (*)    CO2 21 (*)    Glucose, Bld 113 (*)    Calcium 8.1 (*)    Total Protein 6.3 (*)    Albumin 2.3 (*)    All other components within normal limits  CBC - Abnormal; Notable for the following components:   Hemoglobin 10.1 (*)    HCT 32.4 (*)    MCV 77.7 (*)    MCH 24.2 (*)    RDW 15.9 (*)    All other components within normal limits  CULTURE, BLOOD (ROUTINE X 2)  CULTURE, BLOOD (ROUTINE X 2)  RESP PANEL BY RT-PCR (RSV, FLU A&B, COVID)  RVPGX2  LACTIC ACID, PLASMA  LACTIC ACID, PLASMA  PROTIME-INR  TSH    EKG: None  Radiology: No results found.    .Critical Care  Performed by: Elnor Bernarda SQUIBB, DO Authorized by: Elnor Bernarda SQUIBB, DO   Critical care provider statement:    Critical care time (minutes):  103   Critical care was necessary to treat or prevent imminent or life-threatening deterioration of the following conditions:  Sepsis   Critical care was time spent personally by me on the following activities:  Development of treatment plan with patient or surrogate, discussions with consultants, evaluation of patient's response to treatment,  examination of patient, ordering and review of laboratory studies, ordering and review of radiographic studies, ordering and performing treatments and interventions, pulse oximetry, re-evaluation of patient's condition and review of old charts   Care discussed with: admitting provider      Medications Ordered in the ED  acetaminophen  (TYLENOL ) tablet 650 mg (650 mg Oral Given 08/20/24 1802)  cefTRIAXone  (ROCEPHIN ) 1 g in sodium chloride  0.9 % 100 mL IVPB (0 g Intravenous Stopped 08/20/24 1904)  sodium chloride  0.9 % bolus 1,000 mL (0 mLs Intravenous Stopped 08/20/24 2016)                                    Medical Decision Making Amount and/or Complexity of Data Reviewed Labs: ordered. Radiology: ordered.  Risk OTC drugs. Decision regarding hospitalization.   33yo female G76P6 with pmh of gestational hypertension, delivered healthy boy at 38 weeks and 5 days on 10/30, 3 days ago, presenting with fever, body aches, and headache. On arrival pt is tachycardic, hypertensive, and febrile at 100.47F. concerns for sepsis due to 2 sirs criteria. Blood cx, lactic, labs, iv fluids ordered.  Differential dx includies but is not limited to postpartum endometritis, retrained products, UTI, mastitis, complication from epidural, etc. Pt also has a history of hyperthyroidism. TSH ordered to r/o thyroid  storm.    -Some hx of vaginal odor changes. No pelvic or abdominal pain. Although postpardium endometrisis less likely due to no discomfort, transvaginal US  ordered in the setting of vaginal foul odor reported by patient, fever, and no other source. Mucous found on UA. Transvaginal US  results discussed with OBGYN team who will be accepting as primary. Endometritis thought to be unlikely at this time and they do not recommend escalating antibiotics. They will evaluate when pt [facility] to [facility]. Pt accepted as admission under Dr. Nicholaus. -UA mildly positive. Rocephin  started.  -No leukocytosis. -  Breast exam  demonstrates warm, enlarged breasts with engorged ducts. Pt states she hasn't been nursing as much as normal the last 24 hours due to feeling unwell. No erythema, localized pain, or nipple discharge. Low suspicion mastitis.  -hx of complicated spinal tap and headache. No nuchal rigidity or difficulty with neck flexion. No spinal pain. Small puncture wounds x 2 present. No erythema, drainage, swelling, or tenderness.   -no nausea, vomiting, or abdominal pain.       Final diagnoses:  Sepsis, due to unspecified organism, unspecified whether acute organ dysfunction present [facility])  Acute cystitis with hematuria    ED Discharge Orders          Ordered    Diet - low sodium heart healthy        08/22/24 0946    Activity as tolerated        08/22/24 0946    Lifting restrictions       Comments: Weight restriction of 20 lbs.   08/22/24 0946    Sexual activity       Comments: No sex 6 weeks   08/22/24 0946    amoxicillin-clavulanate (AUGMENTIN) 875-125 MG tablet  Every 12 hours        08/22/24 0951    NIFEdipine  (ADALAT  CC) 30 MG 24 hr tablet  Daily        08/22/24 1008              Elnor Bernarda SQUIBB, DO 08/28/24 1544

## 2024-08-20 NOTE — H&P (Signed)
 Obstetrics Admission History & Physical  08/20/2024 - 10:25 PM Primary OBGYN: [facility]  Chief Complaint: PP FUO  History of Present Illness  33 y.o. [id number] readmit . Patient had 10/30 GHTN uncomplicated IOL and NSVD on HD#1. Patient discharged to home yesterday. Patient presented to [facility] ED with fevers, chills and URI s/s. COVID/RSV/flu swab negative, as well as CBC, breast exam, abdominal exam and unremarkable UA (blood, protein and leuks); she also got a pelvic u/s and inconclusive. She did meet SIRS criteria with admit VS at 1720 of temp of 38.1, HR in the 120s; she states the pharmacy didn't have her BP medications so she didn't take any today and her BPs there were 130s-150s/90s-100s. She received a dose of rocephin  and Dr. Nicholaus recommended transfer to [facility] for admission.  Patient denies ever having any neuro s/s, breast pain, abdominal pain, abnormal lochia or lower urinary tract s/s, and no current constitutional s/s and no sick contacts.   Review of Systems:  as noted in the History of Present Illness.  Patient Active Problem List   Diagnosis Date Noted   Sepsis (HCC) 08/20/2024   Postpartum fever 08/20/2024   History of gestational hypertension 08/17/2024   Prior poor obstetrical history, antepartum, third trimester (Hx of GHTN) 08/02/2024   Anemia affecting pregnancy in third trimester 06/21/2024   Hyperthyroidism 03/17/2024   Supervision of high risk pregnancy, antepartum 01/12/2024   Hyperlipidemia 06/18/2023   Gestational hypertension 02/18/2022   History of prior pregnancy with IUGR newborn 10/11/2019   Poor fetal growth affecting management of mother in third trimester (resolved)     PMHx:  Past Medical History:  Diagnosis Date   Anemia    Anxiety 10/11/2019   Contraception management 03/30/2022   Elevated blood pressure reading 06/18/2023   Gestational hypertension 02/18/2022   Migraine    Migraine hx 10/11/2019   Pap smear for  cervical cancer screening 03/30/2022   Postpartum care following vaginal delivery 03/30/2022   SVD (12/23) 10/12/2019   Trichomonal vaginitis during pregnancy    Vaginal delivery 02/18/2022   PSHx:  Past Surgical History:  Procedure Laterality Date   NO PAST SURGERIES     WISDOM TOOTH EXTRACTION     Medications:  Medications Prior to Admission  Medication Sig Dispense Refill Last Dose/Taking   NIFEdipine  (ADALAT  CC) 30 MG 24 hr tablet Take 1 tablet (30 mg total) by mouth daily. 30 tablet 3 Past Week   acetaminophen  (TYLENOL ) 325 MG tablet Take 2 tablets (650 mg total) by mouth every 4 (four) hours as needed (for pain scale < 4).      Ferric Maltol  (ACCRUFER ) 30 MG CAPS Take 1 capsule (30 mg total) by mouth 2 (two) times daily. 60 capsule 1    furosemide  (LASIX ) 20 MG tablet Take 1 tablet (20 mg total) by mouth daily for 4 days. 4 tablet 0    ibuprofen  (ADVIL ) 600 MG tablet Take 1 tablet (600 mg total) by mouth every 6 (six) hours. 30 tablet 0    potassium chloride SA (KLOR-CON M) 20 MEQ tablet Take 1 tablet (20 mEq total) by mouth daily. 4 tablet 0    prenatal vitamin w/FE, FA (PRENATAL 1 + 1) 27-1 MG TABS tablet Take 1 tablet by mouth daily at 12 noon.      senna-docusate (SENOKOT-S) 8.6-50 MG tablet Take 2 tablets by mouth daily. 60 tablet 0      Allergies: has no known allergies. OBHx:  OB History  Gravida Para  Term Preterm AB Living  6 5 5  0 1 6  SAB IAB Ectopic Multiple Live Births  1 0 0 1 6    # Outcome Date GA Lbr Len/2nd Weight Sex Type Anes PTL Lv  6 Term 08/17/24 [redacted]w[redacted]d 01:52 / 00:10 3120 g M Vag-Spont EPI  LIV  5 Term 02/18/22 [redacted]w[redacted]d / 00:06 3080 g F Vag-Spont EPI  LIV  4 Term 10/11/19 [redacted]w[redacted]d 00:31 / 00:12 2605 g M Vag-Spont EPI  LIV  3A Term 03/20/18 [redacted]w[redacted]d / 00:11 2380 g M Vag-Spont EPI  LIV  3B Term 03/20/18 [redacted]w[redacted]d / 00:15 2400 g M Vag-Spont EPI  LIV  2 SAB 2018          1 Term 02/14/15 [redacted]w[redacted]d 02:47 / 00:09 2575 g F Vag-Spont EPI  LIV      FHx:  Family History   Problem Relation Age of Onset   Diabetes Mother    Multiple sclerosis Mother    Fibromyalgia Mother    Kidney disease Father    Hypertension Father    Diabetes Maternal Uncle    Diabetes Maternal Grandmother    Cancer Paternal Grandfather        lung   Autism Paternal Uncle        half-uncle ( husbands brother)    Hypertension Maternal Grandfather    Heart attack Maternal Grandfather    Stroke Maternal Grandfather    Hypertension Paternal Grandmother    Stroke Paternal Grandmother    Heart disease Paternal Grandmother    Soc Hx:  Social History   Socioeconomic History   Marital status: Married    Spouse name: Aubriana Ravelo   Number of children: Not on file   Years of education: Not on file   Highest education level: 12th grade  Occupational History   Not on file  Tobacco Use   Smoking status: Never   Smokeless tobacco: Never  Vaping Use   Vaping status: Never Used  Substance and Sexual Activity   Alcohol use: No   Drug use: No   Sexual activity: Yes    Birth control/protection: None  Other Topics Concern   Not on file  Social History Narrative   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    Married    Social Drivers of Corporate Investment Banker Strain: Low Risk  (11/12/2023)   Overall Financial Resource Strain (CARDIA)    Difficulty of Paying Living Expenses: Not hard at all  Food Insecurity: Unknown (08/17/2024)   Hunger Vital Sign    Worried About Programme Researcher, Broadcasting/film/video in the Last Year: Not on file    [employer] of Food in the Last Year: Never true  Transportation Needs: No Transportation Needs (08/17/2024)   PRAPARE - Administrator, Civil Service (Medical): No    Lack of Transportation (Non-Medical): No  Physical Activity: Unknown (11/12/2023)   Exercise Vital Sign    Days of Exercise per Week: 0 days    Minutes of Exercise per Session: Not on file  Stress: Stress Concern Present (11/12/2023)   [employer] of Occupational Health - Occupational Stress  Questionnaire    Feeling of Stress : Rather much  Social Connections: Socially Integrated (08/17/2024)   Social Connection and Isolation Panel    Frequency of Communication with Friends and Family: Twice a week    Frequency of Social Gatherings with Friends and Family: Three times a week    Attends Religious Services: More than 4 times  per year    Active Member of Clubs or Organizations: Yes    Attends Banker Meetings: Never    Marital Status: Married  Catering Manager Violence: Not At Risk (08/17/2024)   Humiliation, Afraid, Rape, and Kick questionnaire    Fear of Current or Ex-Partner: No    Emotionally Abused: No    Physically Abused: No    Sexually Abused: No    Objective  Patient Vitals for the past 12 hrs:  BP Temp Temp src Pulse Resp SpO2 Height Weight  08/20/24 2030 (!) 136/104 99 F (37.2 C) Oral -- -- 97 % -- --  08/20/24 1900 (!) 133/90 -- -- 98 20 99 % -- --  08/20/24 1802 (!) 147/108 99.8 F (37.7 C) Oral (!) 110 17 98 % -- --  08/20/24 1730 (!) 154/103 -- -- (!) 120 19 99 % -- --  08/20/24 1727 -- -- -- (!) 127 (!) 22 98 % -- --  08/20/24 1725 -- -- -- -- -- -- 5' 2 (1.575 m) 65.3 kg  08/20/24 1724 (!) 154/109 -- -- (!) 119 (!) 22 100 % -- --  08/20/24 1721 (!) 157/106 (!) 100.5 F (38.1 C) Oral (!) 121 18 98 % -- --   General: Well nourished, well developed female in no acute distress.  Skin:  Warm and dry.  Cardiovascular: S1, S2 normal, no murmur, rub or gallop, regular rate and rhythm Respiratory:  Clear to auscultation bilateral. Normal respiratory effort Abdomen: firm fundus, nttp Back: no CVAT Neuro/Psych:  Normal mood and affect.   Labs  As per HPI Neg covid/rsv/flu, tsh, pt, cbc (9.2 & 10.4 hgb), LA, cmp, u/a, GBS Pending: bcx, ucx  Recent Labs  Lab 08/17/24 1506 08/18/24 0419 08/20/24 1738  WBC 7.9 13.1* 9.2  HGB 9.3* 9.0* 10.4*  HCT 29.3* 28.4* 33.4*  PLT 285 245 281   Recent Labs  Lab 08/15/24 0950 08/17/24 1506  08/20/24 1738  NA 136 134* 137  K 4.1 3.5 3.7  CL 102 105 102  CO2 20 19* 24  BUN 5* 6 7  CREATININE 0.63 0.54 0.66  CALCIUM 8.7 8.4* 9.4  PROT 7.3 7.1 7.9  BILITOT 0.4 0.6 0.3  ALKPHOS 138* 112 135*  ALT 6 10 9   AST 16 17 25   GLUCOSE 75 91 107*   Radiology Images reviewed and not concerning for retained POCs  Assessment & Plan   33 y.o. [id number] with PP FUO. Patient stable Follow up cultures. Pt to let us  know if she has any s/s. Unsure etiology but no e/o PP endometritis, pyelo, UTI, mastitis, PNA Regular diet, ppx lovenox, oob ad lib. Breast pump to bedside.     Bebe Izell Raddle MD Attending [facility] GYN Consult Phone: [phone] (M-F, [phone]) & [phone] (Off hours, weekends, holidays)

## 2024-08-20 NOTE — ED Triage Notes (Addendum)
 Fever, body aches, intermittent cough, runny nose onset yesterday. Ibuprofen  @ 1500 today.   Thursday vaginal delivery and Saturday she was discharged. Only difficulty for delivery was epidural.  Elevated BP, was to pick up her HTN medication but has not yet.

## 2024-08-21 LAB — COMPREHENSIVE METABOLIC PANEL WITH GFR
ALT: 19 U/L (ref 0–44)
AST: 27 U/L (ref 15–41)
Albumin: 2.3 g/dL — ABNORMAL LOW (ref 3.5–5.0)
Alkaline Phosphatase: 93 U/L (ref 38–126)
Anion gap: 11 (ref 5–15)
BUN: 6 mg/dL (ref 6–20)
CO2: 21 mmol/L — ABNORMAL LOW (ref 22–32)
Calcium: 8.1 mg/dL — ABNORMAL LOW (ref 8.9–10.3)
Chloride: 105 mmol/L (ref 98–111)
Creatinine, Ser: 0.53 mg/dL (ref 0.44–1.00)
GFR, Estimated: >60 mL/min (ref >60)
Glucose, Bld: 113 mg/dL — ABNORMAL HIGH (ref 70–99)
Potassium: 3.1 mmol/L — ABNORMAL LOW (ref 3.5–5.1)
Sodium: 137 mmol/L (ref 135–145)
Total Bilirubin: 0.3 mg/dL (ref 0.0–1.2)
Total Protein: 6.3 g/dL — ABNORMAL LOW (ref 6.5–8.1)

## 2024-08-21 LAB — CBC
HCT: 30.7 % — ABNORMAL LOW (ref 36.0–46.0)
Hemoglobin: 9.5 g/dL — ABNORMAL LOW (ref 12.0–15.0)
MCH: 23.8 pg — ABNORMAL LOW (ref 26.0–34.0)
MCHC: 30.9 g/dL (ref 30.0–36.0)
MCV: 76.8 fL — ABNORMAL LOW (ref 80.0–100.0)
Platelets: 288 K/uL (ref 150–400)
RBC: 4 MIL/uL (ref 3.87–5.11)
RDW: 15.6 % — ABNORMAL HIGH (ref 11.5–15.5)
WBC: 8 K/uL (ref 4.0–10.5)
nRBC: 0 % (ref 0.0–0.2)

## 2024-08-21 LAB — URINE CULTURE: Culture: <10000 — AB

## 2024-08-21 MED ORDER — POTASSIUM CHLORIDE CRYS ER 20 MEQ PO TBCR
20.0000 meq | EXTENDED_RELEASE_TABLET | Freq: Two times a day (BID) | ORAL | Status: DC
  Administered 2024-08-21 – 2024-08-22 (×2): 20 meq via ORAL
  Filled 2024-08-21 (×2): qty 1

## 2024-08-21 MED ORDER — SODIUM CHLORIDE 0.9 % IV SOLN
2.0000 g | INTRAVENOUS | Status: DC
  Administered 2024-08-21: 2 g via INTRAVENOUS
  Filled 2024-08-21: qty 20

## 2024-08-21 MED ORDER — SODIUM CHLORIDE 0.9 % IV SOLN: INTRAVENOUS | Status: DC

## 2024-08-21 NOTE — Progress Notes (Signed)
 Patient declined TOC consult. Denied any concerns with social determinants of health issues.or any other needs necessitating SW intervention.

## 2024-08-21 NOTE — Progress Notes (Signed)
 Patient ID: Courtney [name], female   DOB: [birth year], 33 y.o.   MRN: [id number] PPD #4  Courtney [name] is a 33 y.o. female patient.  Feels fine today No complaints at all    Past Medical History:  Diagnosis Date   Anemia    Anxiety 10/11/2019   Contraception management 03/30/2022   Elevated blood pressure reading 06/18/2023   Gestational hypertension 02/18/2022   Migraine    Migraine hx 10/11/2019   Pap smear for cervical cancer screening 03/30/2022   Postpartum care following vaginal delivery 03/30/2022   SVD (12/23) 10/12/2019   Trichomonal vaginitis during pregnancy    Vaginal delivery 02/18/2022    No past surgical history pertinent negatives on file.  Scheduled Meds:  enoxaparin (LOVENOX) injection  40 mg Subcutaneous Q24H   furosemide   20 mg Oral Daily   NIFEdipine   30 mg Oral Daily   potassium chloride  20 mEq Oral BID    Continuous Infusions:  cefTRIAXone  (ROCEPHIN )  IV      PRN Meds:acetaminophen , benzocaine -Menthol , coconut oil, witch hazel-glycerin  **AND** dibucaine, diphenhydrAMINE , ibuprofen , ondansetron  **OR** ondansetron  (ZOFRAN ) IV, senna-docusate, simethicone , zolpidem   No Known Allergies  Principal Problem:   Sepsis (HCC) Active Problems:   Postpartum fever    Objective   Vitals:   08/21/24 0135 08/21/24 0424 08/21/24 0549 08/21/24 0738  BP:  137/87  (!) 145/96  Pulse:  93  87  Resp:    18  Temp: 97.9 F (36.6 C)  98.4 F (36.9 C) 98.7 F (37.1 C)  TempSrc: Oral  Oral Oral  SpO2:  99%  99%  Weight:      Height:       Vitals:   08/20/24 1721 08/20/24 1724 08/20/24 1730 08/20/24 1802  BP: (!) 157/106 (!) 154/109 (!) 154/103 (!) 147/108   08/20/24 1900 08/20/24 2030 08/20/24 2150 08/20/24 2311  BP: (!) 133/90 (!) 136/104 (!) 141/90 (!) 146/100   08/21/24 0424 08/21/24 0738  BP: 137/87 (!) 145/96     Subjective Objective: Vital signs (most recent): Blood pressure (!) 145/96, pulse 87, temperature 98.7 F (37.1 C), temperature  source Oral, resp. rate 18, height 5' 2 (1.575 m), weight 65.3 kg, SpO2 99%, currently breastfeeding.   Gen  WDWN NAD Abdomen  soft benign completely non tender Incision  na No CVAT     Latest Ref Rng & Units 08/21/2024    4:16 AM 08/20/2024    5:38 PM 08/18/2024    4:19 AM  CBC  WBC 4.0 - 10.5 K/uL 8.0  9.2  13.1   Hemoglobin 12.0 - 15.0 g/dL 9.5  89.5  9.0   Hematocrit 36.0 - 46.0 % 30.7  33.4  28.4   Platelets 150 - 400 K/uL 288  281  245        Latest Ref Rng & Units 08/21/2024    4:16 AM 08/20/2024    5:38 PM 08/17/2024    3:06 PM  CMP  Glucose 70 - 99 mg/dL 886  892  91   BUN 6 - 20 mg/dL 6  7  6    Creatinine 0.44 - 1.00 mg/dL 9.46  9.33  9.45   Sodium 135 - 145 mmol/L 137  137  134   Potassium 3.5 - 5.1 mmol/L 3.1  3.7  3.5   Chloride 98 - 111 mmol/L 105  102  105   CO2 22 - 32 mmol/L 21  24  19    Calcium 8.9 - 10.3 mg/dL 8.1  9.4  8.4  Total Protein 6.5 - 8.1 g/dL 6.3  7.9  7.1   Total Bilirubin 0.0 - 1.2 mg/dL 0.3  0.3  0.6   Alkaline Phos 38 - 126 U/L 93  135  112   AST 15 - 41 U/L 27  25  17    ALT 0 - 44 U/L 19  9  10       Assessment & Plan  Postpartum fever: No definitive source so will assume likely related to delivery itself Cover with rocephin  tonight and move to oral augmentin in am and sisacharge if doing well  DISPO: >rocephin  2 grams tonight, anticipate discharge in am if clinical status remioans stable > improving   Vonn VEAR Inch, MD 08/21/2024, 10:32 AM

## 2024-08-22 ENCOUNTER — Encounter

## 2024-08-22 ENCOUNTER — Other Ambulatory Visit (HOSPITAL_COMMUNITY): Payer: Self-pay

## 2024-08-22 LAB — CBC
HCT: 32.4 % — ABNORMAL LOW (ref 36.0–46.0)
Hemoglobin: 10.1 g/dL — ABNORMAL LOW (ref 12.0–15.0)
MCH: 24.2 pg — ABNORMAL LOW (ref 26.0–34.0)
MCHC: 31.2 g/dL (ref 30.0–36.0)
MCV: 77.7 fL — ABNORMAL LOW (ref 80.0–100.0)
Platelets: 310 K/uL (ref 150–400)
RBC: 4.17 MIL/uL (ref 3.87–5.11)
RDW: 15.9 % — ABNORMAL HIGH (ref 11.5–15.5)
WBC: 7.7 K/uL (ref 4.0–10.5)
nRBC: 0 % (ref 0.0–0.2)

## 2024-08-22 MED ORDER — AMOXICILLIN-POT CLAVULANATE 875-125 MG PO TABS
1.0000 | ORAL_TABLET | Freq: Two times a day (BID) | ORAL | 0 refills | Status: DC
  Filled 2024-08-22: qty 20, 10d supply, fill #0

## 2024-08-22 MED ORDER — NIFEDIPINE ER 30 MG PO TB24
30.0000 mg | ORAL_TABLET | Freq: Every day | ORAL | 3 refills | Status: DC
  Filled 2024-08-22 (×2): qty 30, 30d supply, fill #0

## 2024-08-22 MED ORDER — AMOXICILLIN-POT CLAVULANATE 875-125 MG PO TABS
1.0000 | ORAL_TABLET | Freq: Two times a day (BID) | ORAL | Status: DC
  Administered 2024-08-22: 1 via ORAL
  Filled 2024-08-22: qty 1

## 2024-08-22 NOTE — Discharge Summary (Addendum)
 Physician Discharge Summary  Patient ID: Courtney [name] MRN: [id number] DOB/AGE: 06-01-1991 33 y.o.  Admit date: 08/20/2024 Discharge date: 08/22/2024  Admission Diagnoses: Postpartum fever, rule out sepsis  Discharge Diagnoses:  Principal Problem:   Sepsis (HCC) Active Problems:   Postpartum fever   Discharged Condition: good  Hospital Course: treated with Rocephin  IV x 2 doses No source of fever found Lactic acid normal Blood cultures and urine culture are negative Exam completely normal  Discharge presumptive peripartum fever from pelvic source, treat with augmentin x 10 d  Consults:   Significant Diagnostic Studies: labs:  and microbiology: blood culture: negative and urine culture: negative  Treatments: rocephin  x 2 doses  Discharge Exam: Blood pressure (!) 129/92, pulse 77, temperature 97.9 F (36.6 C), temperature source Oral, resp. rate 16, height 5' 2 (1.575 m), weight 65.3 kg, SpO2 98%, currently breastfeeding. General appearance: alert, cooperative, and no distress GI: soft, non-tender; bowel sounds normal; no masses,  no organomegaly No CVAT  Disposition: Discharge disposition: 01-Home or Self Care       Discharge Instructions     Activity as tolerated   Complete by: As directed    Diet - low sodium heart healthy   Complete by: As directed    Lifting restrictions   Complete by: As directed    Weight restriction of 20 lbs.   Sexual activity   Complete by: As directed    No sex 6 weeks        Follow-up Information     [facility] Follow up on 08/28/2024.   Specialty: Obstetrics and Gynecology Why: follow up Contact information: [address] [place]  [address] [phone]                Signed: Vonn VEAR Inch 08/22/2024, 10:08 AM

## 2024-08-25 ENCOUNTER — Ambulatory Visit

## 2024-08-25 LAB — CULTURE, BLOOD (ROUTINE X 2)
Culture: NO GROWTH
Culture: NO GROWTH
Special Requests: ADEQUATE
Special Requests: ADEQUATE

## 2024-08-28 ENCOUNTER — Ambulatory Visit: Admitting: *Deleted

## 2024-08-28 ENCOUNTER — Other Ambulatory Visit: Payer: Self-pay | Admitting: Lactation Services

## 2024-08-28 ENCOUNTER — Other Ambulatory Visit: Payer: Self-pay

## 2024-08-28 ENCOUNTER — Encounter: Payer: Self-pay | Admitting: Lactation Services

## 2024-08-28 VITALS — BP 131/90 | HR 86 | Temp 98.4°F | Ht 62.0 in | Wt 135.3 lb

## 2024-08-28 DIAGNOSIS — Z0130 Encounter for examination of blood pressure without abnormal findings: Secondary | ICD-10-CM

## 2024-08-28 MED ORDER — NYSTATIN 100000 UNIT/GM EX CREA: TOPICAL_CREAM | CUTANEOUS | 1 refills | Status: DC

## 2024-08-28 NOTE — Progress Notes (Signed)
 Here for BP check s/p GHTN with Vaginal delivery 08/17/24. Then readmitted with Postpartum sepsis 08/20/24. BP today 129/95, repeat 131/90 . She is taking Procardia  in the afternoon - last yesterday about 3pm. C/o occasional HA=4-5, relieved with tylenol . No edema noted. Reported assessment, BP's, history with Dr. Nicholaus who advises continue same dose Procardia , Bp check one week. I also advised patient symtoms to report or to go to hospital. She also reports baby has thrush and request treatment- discussed with LC and they will send in treatment per protocol. She voices understanding. Rock Skip PEAK

## 2024-08-28 NOTE — Progress Notes (Signed)
 Infant with Thrush, mom with soreness to nipples with latch. Mom denies shooting pain in the breasts. Treated mom per protocol.

## 2024-09-01 ENCOUNTER — Ambulatory Visit: Admitting: Family Medicine

## 2024-09-04 ENCOUNTER — Ambulatory Visit (INDEPENDENT_AMBULATORY_CARE_PROVIDER_SITE_OTHER): Payer: Self-pay

## 2024-09-04 VITALS — BP 150/109 | HR 78 | Ht 62.0 in | Wt 134.6 lb

## 2024-09-04 DIAGNOSIS — O163 Unspecified maternal hypertension, third trimester: Secondary | ICD-10-CM

## 2024-09-04 DIAGNOSIS — O165 Unspecified maternal hypertension, complicating the puerperium: Secondary | ICD-10-CM

## 2024-09-04 MED ORDER — NIFEDIPINE ER 30 MG PO TB24: 30.0000 mg | ORAL_TABLET | Freq: Two times a day (BID) | ORAL | 3 refills | Status: AC

## 2024-09-04 NOTE — Progress Notes (Signed)
 Subjective:  Courtney [name] is a H3E4983 here for BP check.  She is 2 weeks postpartum following a normal spontaneous vaginal delivery.    Hypertension ROS: Patient denies headache and visual changes   Objective:  BP (!) 150/109 Comment: manual BP  Pulse 78   Ht 5' 2 (1.575 m)   Wt 134 lb 9.6 oz (61.1 kg)   LMP  (Approximate) Comment: pt states menstrual cycle has not started since after last pregnancy.  Breastfeeding Yes   BMI 24.62 kg/m   Appearance alert, well appearing, and in no distress, oriented to person, place, and time, and normal appearing weight.  Assessment:   Blood Pressure needs improvement.   Plan:  Per Dr.Duncan recommendation to increase Nifephine 30 mg to bid from once daily.  Pt informed of providers recommendation and start taking twice a day today.  Pt denies having a blood pressure cuff at home, pt agreed to rpt BP check on 09/07/24 at 1000.     Ambera Fedele, RN   09/04/24

## 2024-09-07 ENCOUNTER — Other Ambulatory Visit: Payer: Self-pay

## 2024-09-07 ENCOUNTER — Ambulatory Visit (INDEPENDENT_AMBULATORY_CARE_PROVIDER_SITE_OTHER): Payer: Self-pay | Admitting: *Deleted

## 2024-09-07 ENCOUNTER — Encounter: Payer: Self-pay | Admitting: Lactation Services

## 2024-09-07 VITALS — BP 122/95 | HR 82 | Wt 133.3 lb

## 2024-09-07 DIAGNOSIS — Z0130 Encounter for examination of blood pressure without abnormal findings: Secondary | ICD-10-CM

## 2024-09-07 NOTE — Progress Notes (Signed)
 Here for BP check after dose change. Vaginal delivery 08/17/24 with GHTN. States she did not start taking Procardia  BID as directed at nurse visit for BP check 09/04/24  but did make dietary changes such as no fast food, no soda's, fried food. She states she took her BP at home today and it 125/89 and yesterday 117/83. Denies HA or edema. Today BP's 144/93, 122/95. BP and assessment/ history reviewed with Dr. Lola who advises must take Procardia  BID and BP recheck in one week. Advised if any issues to notify us  or when to report to hospital. She voices understanding.  Rock Skip PEAK

## 2024-09-13 ENCOUNTER — Encounter

## 2024-09-13 ENCOUNTER — Other Ambulatory Visit: Payer: Self-pay

## 2024-09-13 NOTE — Progress Notes (Signed)
 This appointment was cancelled 09/13/24.  A user error has taken place: encounter opened in error, closed for administrative reasons.    Rosaline, RN

## 2024-09-15 ENCOUNTER — Encounter: Payer: Self-pay | Admitting: Family

## 2024-09-18 ENCOUNTER — Telehealth: Payer: Self-pay | Admitting: Family Medicine

## 2024-09-18 ENCOUNTER — Inpatient Hospital Stay (HOSPITAL_COMMUNITY)
Admission: AD | Admit: 2024-09-18 | Discharge: 2024-09-18 | Disposition: A | Discharge status: Home / Self Care | Payer: Self-pay | Attending: Obstetrics and Gynecology | Admitting: Obstetrics and Gynecology

## 2024-09-18 ENCOUNTER — Encounter (HOSPITAL_COMMUNITY): Payer: Self-pay | Admitting: Obstetrics and Gynecology

## 2024-09-18 ENCOUNTER — Ambulatory Visit: Payer: Self-pay

## 2024-09-18 DIAGNOSIS — O139 Gestational [pregnancy-induced] hypertension without significant proteinuria, unspecified trimester: Secondary | ICD-10-CM

## 2024-09-18 DIAGNOSIS — O135 Gestational [pregnancy-induced] hypertension without significant proteinuria, complicating the puerperium: Secondary | ICD-10-CM

## 2024-09-18 LAB — CBC
HCT: 36.1 % (ref 36.0–46.0)
Hemoglobin: 11.2 g/dL — ABNORMAL LOW (ref 12.0–15.0)
MCH: 24.3 pg — ABNORMAL LOW (ref 26.0–34.0)
MCHC: 31 g/dL (ref 30.0–36.0)
MCV: 78.3 fL — ABNORMAL LOW (ref 80.0–100.0)
Platelets: 265 K/uL (ref 150–400)
RBC: 4.61 MIL/uL (ref 3.87–5.11)
RDW: 17.3 % — ABNORMAL HIGH (ref 11.5–15.5)
WBC: 6.7 K/uL (ref 4.0–10.5)
nRBC: 0 % (ref 0.0–0.2)

## 2024-09-18 LAB — COMPREHENSIVE METABOLIC PANEL WITH GFR
ALT: 19 U/L (ref 0–44)
AST: 21 U/L (ref 15–41)
Albumin: 3.4 g/dL — ABNORMAL LOW (ref 3.5–5.0)
Alkaline Phosphatase: 87 U/L (ref 38–126)
Anion gap: 8 (ref 5–15)
BUN: 10 mg/dL (ref 6–20)
CO2: 25 mmol/L (ref 22–32)
Calcium: 8.7 mg/dL — ABNORMAL LOW (ref 8.9–10.3)
Chloride: 103 mmol/L (ref 98–111)
Creatinine, Ser: 0.62 mg/dL (ref 0.44–1.00)
GFR, Estimated: >60 mL/min (ref >60)
Glucose, Bld: 93 mg/dL (ref 70–99)
Potassium: 3.8 mmol/L (ref 3.5–5.1)
Sodium: 136 mmol/L (ref 135–145)
Total Bilirubin: 0.5 mg/dL (ref 0.0–1.2)
Total Protein: 7.3 g/dL (ref 6.5–8.1)

## 2024-09-18 NOTE — Telephone Encounter (Signed)
 Patient has questions about her BP's being abnormal. Requesting a call back.

## 2024-09-18 NOTE — Discharge Instructions (Signed)
 You were seen in the MAU for high blood pressure. After some rest your blood pressure came down to the normal range.  Check your blood pressure once a day with your feet on the ground and not crossed, your back supported, your arm at the level of your heart, and after resting for at least 5 minutes without any distractions.  Continue to take your blood pressure medicine as prescribed.

## 2024-09-18 NOTE — Telephone Encounter (Signed)
 Patient would like to receive a call from the clinical staff. She has questions about her BP, and may need to see a provider before her scheduled postpartum.

## 2024-09-18 NOTE — Telephone Encounter (Signed)
 Pt needs a follow up visit. Thanks, BJ

## 2024-09-18 NOTE — MAU Provider Note (Signed)
 History     [id number]  Arrival date and time: 09/18/24 1639    Chief Complaint  Patient presents with   Hypertension     HPI Courtney [name] is a 33 y.o. at s/p NSVD on 08/17/2024 with PMHx notable for gestational HTN in most recent pregnancy on Nifedipine  60 BID, hyperthyroidism, postpartum fever of unclear etiology treated with Augmentin  x10d, who presents for elevated BP's and headache.   Patient reports high BP's when checking them at home BP's are 150's/100's Reports when she checks her BP at home it is usually under chaotic conditions with six kids Usually being bothered by someone and not able to do it sitting down Currently no headache, vision changes, chest pain, shortness of breath, or right upper quadrant pain No longer bleeding vaginally   --/--/O POS (10/30 1506)  OB History     Gravida  6   Para  5   Term  5   Preterm  0   AB  1   Living  6      SAB  1   IAB  0   Ectopic  0   Multiple  1   Live Births  6           Past Medical History:  Diagnosis Date   Anemia    Anxiety 10/11/2019   Contraception management 03/30/2022   Elevated blood pressure reading 06/18/2023   Gestational hypertension 02/18/2022   Migraine    Migraine hx 10/11/2019   Pap smear for cervical cancer screening 03/30/2022   Postpartum care following vaginal delivery 03/30/2022   SVD (12/23) 10/12/2019   Trichomonal vaginitis during pregnancy    Vaginal delivery 02/18/2022    Past Surgical History:  Procedure Laterality Date   NO PAST SURGERIES     WISDOM TOOTH EXTRACTION      Family History  Problem Relation Age of Onset   Diabetes Mother    Multiple sclerosis Mother    Fibromyalgia Mother    Kidney disease Father    Hypertension Father    Diabetes Maternal Uncle    Diabetes Maternal Grandmother    Cancer Paternal Grandfather        lung   Autism Paternal Uncle        half-uncle ( husbands brother)    Hypertension Maternal Grandfather     Heart attack Maternal Grandfather    Stroke Maternal Grandfather    Hypertension Paternal Grandmother    Stroke Paternal Grandmother    Heart disease Paternal Grandmother     Social History   Socioeconomic History   Marital status: Married    Spouse name: Dori Devino   Number of children: Not on file   Years of education: Not on file   Highest education level: 12th grade  Occupational History   Not on file  Tobacco Use   Smoking status: Never   Smokeless tobacco: Never  Vaping Use   Vaping status: Never Used  Substance and Sexual Activity   Alcohol use: No   Drug use: No   Sexual activity: Not Currently  Other Topics Concern   Not on file  Social History Narrative   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    Married    Social Drivers of Health   Financial Resource Strain: Low Risk  (11/12/2023)   Overall Financial Resource Strain (CARDIA)    Difficulty of Paying Living Expenses: Not hard at all  Food Insecurity: No Food Insecurity (08/21/2024)   Hunger Vital Sign  Worried About Programme Researcher, Broadcasting/film/video in the Last Year: Never true    Ran Out of Food in the Last Year: Never true  Transportation Needs: Patient Declined (08/22/2024)   PRAPARE - Administrator, Civil Service (Medical): Patient declined    Lack of Transportation (Non-Medical): Patient declined  Physical Activity: Unknown (11/12/2023)   Exercise Vital Sign    Days of Exercise per Week: 0 days    Minutes of Exercise per Session: Not on file  Stress: Stress Concern Present (11/12/2023)   [employer] of Occupational Health - Occupational Stress Questionnaire    Feeling of Stress : Rather much  Social Connections: Socially Integrated (08/17/2024)   Social Connection and Isolation Panel    Frequency of Communication with Friends and Family: Twice a week    Frequency of Social Gatherings with Friends and Family: Three times a week    Attends Religious Services: More than 4 times per year    Active Member of  Clubs or Organizations: Yes    Attends Banker Meetings: Never    Marital Status: Married  Catering Manager Violence: Patient Declined (08/22/2024)   Humiliation, Afraid, Rape, and Kick questionnaire    Fear of Current or Ex-Partner: Patient declined    Emotionally Abused: Patient declined    Physically Abused: Patient declined    Sexually Abused: Patient declined    No Known Allergies  No current facility-administered medications on file prior to encounter.   Current Outpatient Medications on File Prior to Encounter  Medication Sig Dispense Refill   NIFEdipine  (ADALAT  CC) 30 MG 24 hr tablet Take 1 tablet (30 mg total) by mouth in the morning and at bedtime. (Patient taking differently: Take 30 mg by mouth in the morning and at bedtime. Taking daily) 30 tablet 3   prenatal vitamin w/FE, FA (PRENATAL 1 + 1) 27-1 MG TABS tablet Take 1 tablet by mouth daily at 12 noon. (Patient not taking: Reported on 09/07/2024)       ROS Pertinent positives and negative per HPI, all others reviewed and negative  Physical Exam   BP 113/78   Pulse 72   Temp 98.4 F (36.9 C)   Resp 18   Ht 5' 2 (1.575 m)   Wt 61.2 kg   SpO2 100%   Breastfeeding Yes   BMI 24.69 kg/m   Patient Vitals for the past 24 hrs:  BP Temp Pulse Resp SpO2 Height Weight  09/18/24 1900 113/78 -- 72 -- 100 % -- --  09/18/24 1855 -- -- -- -- 100 % -- --  09/18/24 1850 -- -- -- -- 99 % -- --  09/18/24 1846 118/79 -- 70 -- -- -- --  09/18/24 1845 -- -- -- -- 99 % -- --  09/18/24 1840 -- -- -- -- 100 % -- --  09/18/24 1835 -- -- -- -- 99 % -- --  09/18/24 1830 (!) 130/94 -- 92 -- 99 % -- --  09/18/24 1825 -- -- -- -- 99 % -- --  09/18/24 1820 -- -- -- -- 99 % -- --  09/18/24 1815 122/82 -- 70 -- 99 % -- --  09/18/24 1810 -- -- -- -- 99 % -- --  09/18/24 1805 -- -- -- -- 99 % -- --  09/18/24 1800 118/83 -- 71 -- 100 % -- --  09/18/24 1755 -- -- -- -- 99 % -- --  09/18/24 1750 -- -- -- -- 99 % -- --   09/18/24  1745 (!) 124/92 -- 75 -- 100 % -- --  09/18/24 1740 -- -- -- -- 100 % -- --  09/18/24 1735 -- -- -- -- 100 % -- --  09/18/24 1730 -- -- -- -- 100 % -- --  09/18/24 1729 (!) 147/102 -- 78 -- -- -- --  09/18/24 1712 (!) 151/99 98.4 F (36.9 C) 85 18 -- 5' 2 (1.575 m) 61.2 kg    Physical Exam Vitals reviewed.  Constitutional:      General: She is not in acute distress.    Appearance: She is well-developed. She is not diaphoretic.  Eyes:     General: No scleral icterus. Pulmonary:     Effort: Pulmonary effort is normal. No respiratory distress.  Skin:    General: Skin is warm and dry.  Neurological:     Mental Status: She is alert.     Coordination: Coordination normal.      Cervical Exam    Bedside Ultrasound Not performed.  My interpretation: n/a   Labs Results for orders placed or performed during the hospital encounter of 09/18/24 (from the past 24 hours)  CBC     Status: Abnormal   Collection Time: 09/18/24  5:54 PM  Result Value Ref Range   WBC 6.7 4.0 - 10.5 K/uL   RBC 4.61 3.87 - 5.11 MIL/uL   Hemoglobin 11.2 (L) 12.0 - 15.0 g/dL   HCT 63.8 63.9 - 53.9 %   MCV 78.3 (L) 80.0 - 100.0 fL   MCH 24.3 (L) 26.0 - 34.0 pg   MCHC 31.0 30.0 - 36.0 g/dL   RDW 82.6 (H) 88.4 - 84.4 %   Platelets 265 150 - 400 K/uL   nRBC 0.0 0.0 - 0.2 %  Comprehensive metabolic panel with GFR     Status: Abnormal   Collection Time: 09/18/24  5:54 PM  Result Value Ref Range   Sodium 136 135 - 145 mmol/L   Potassium 3.8 3.5 - 5.1 mmol/L   Chloride 103 98 - 111 mmol/L   CO2 25 22 - 32 mmol/L   Glucose, Bld 93 70 - 99 mg/dL   BUN 10 6 - 20 mg/dL   Creatinine, Ser 9.37 0.44 - 1.00 mg/dL   Calcium 8.7 (L) 8.9 - 10.3 mg/dL   Total Protein 7.3 6.5 - 8.1 g/dL   Albumin 3.4 (L) 3.5 - 5.0 g/dL   AST 21 15 - 41 U/L   ALT 19 0 - 44 U/L   Alkaline Phosphatase 87 38 - 126 U/L   Total Bilirubin 0.5 0.0 - 1.2 mg/dL   GFR, Estimated >39 >39 mL/min   Anion gap 8 5 - 15     Imaging No results found.  MAU Course  Procedures Lab Orders         CBC         Comprehensive metabolic panel with GFR    No orders of the defined types were placed in this encounter.  Imaging Orders  No imaging studies ordered today    MDM Moderate (Level 3-4)  Assessment and Plan  #Gestational hypertension #S/p NSVD Initially very HTN, came down to normal range without intervention. Asymptomatic. CBC/CMP unremarkable. She is getting high home BP's but checking under suboptimal conditions. Discussed only checking once a day and only with proper technique. Continue nifedipine  60 XL BID as she has had Bp checks in office that are high and definitely needs some meds on board. Has PP visit scheduled for 09/29/2024, reassess regimen  at that time.     Dispo: discharged to home in stable condition    Donnice CHRISTELLA Carolus, MD/MPH 09/18/24 7:19 PM  Allergies as of 09/18/2024   No Known Allergies      Medication List     TAKE these medications    NIFEdipine  30 MG 24 hr tablet Commonly known as: ADALAT  CC Take 1 tablet (30 mg total) by mouth in the morning and at bedtime. What changed: additional instructions   prenatal vitamin w/FE, FA 27-1 MG Tabs tablet Take 1 tablet by mouth daily at 12 noon.

## 2024-09-18 NOTE — Telephone Encounter (Signed)
 Patient states she has had several elevated blood pressures since 09/14/24.  She denies any headache, visual disturbances, pain in chest, or trouble breathing.  She reports she has been taking her Nifedipine  30mg  twice a day.  Today her BP first thing this morning before medication was 144/101.  Two reading this afternoon post medication were 150/99 and 145/101.  Discussed evaluation at MAU.  Pt agreeable to moving nurse visit BP check to tomorrow at 3:40pm.  Pt states she may got to MAU this evening, reiterated definite need for evaluation at hospital if symptoms start or numbers worse.  She verbalized understanding.   [place], RN

## 2024-09-18 NOTE — MAU Note (Signed)
 Courtney [name] is a 33 y.o. at Unknown here in MAU reporting: 08/17/24 vag delivery. Was put on nifedipine  60 bid. B/p are still elevated. 158/110 . Reports slight headache.  Denies visual changes   Onset of complaint: today Pain score: 1 Vitals:   09/18/24 1712  BP: (!) 151/99  Pulse: 85  Resp: 18  Temp: 98.4 F (36.9 C)     FHT: n/a  Lab orders placed from triage:

## 2024-09-18 NOTE — Telephone Encounter (Signed)
 FYI Only or Action Required?: FYI only for provider: UC advised.  Patient was last seen in primary care on 11/12/2023 by [name], [name], MD.  Called Nurse Triage reporting Hypertension.  Symptoms began a week ago.  Interventions attempted: Prescription medications: Nifedipine .  Symptoms are: unchanged.  Triage Disposition: See HCP Within 4 Hours (Or PCP Triage)  Patient/caregiver understands and will follow disposition?: Yes Reason for Disposition  Systolic BP >= 140 OR Diastolic >= 90  Answer Assessment - Initial Assessment Questions Current BP while on the phone with RN is 149/99. Advised patient to UC for symptoms. Patient verbalized understanding.   1. BLOOD PRESSURE: What is your blood pressure? Did you take at least two measurements 5 minutes apart?     144/109  2. ONSET: When did you take your blood pressure?     10 minutes ago  3. HOW: How did you take your blood pressure? (e.Courtney [name]., automatic home BP monitor, visiting nurse)     Automatic home monitor  4. HISTORY: Do you have a history of high blood pressure? Were you diagnosed with preeclampsia during this or previous pregnancies?     Yes, was not diagnosed with preeclampsia  5. MEDICINES: Are you taking any medicines for blood pressure? Have you missed any doses recently?     NIFEdipine  (ADALAT  CC) 30 MG 24 hr tablet  6. DELIVERY: When was your delivery date? Vaginal delivery or C-section? How many babies?  (e.Courtney [name]., single baby, twins)     Delivered 08/17/24, vaginal   7. OTHER SYMPTOMS: Do you have any other symptoms? (e.Courtney [name]., abdomen pain, chest pain, ankle or leg swelling, difficulty breathing, hand or face swelling, headache, vision changes; feeling anxious, depressed, overwhelmed, or sad)     Feels vision may be blurred but that comes and goes, noticed that last week.  Protocols used: Postpartum - High Blood Pressure-A-AH  Copied from CRM #[id number]. Topic: Clinical - Red Word Triage >> Sep 18, 2024  1:16 PM Delon DASEN wrote: Red Word that prompted transfer to Nurse Triage: high blood pressure 144/109- post partum 4 wks

## 2024-09-19 ENCOUNTER — Other Ambulatory Visit: Payer: Self-pay

## 2024-09-19 ENCOUNTER — Ambulatory Visit

## 2024-09-19 VITALS — BP 112/78 | HR 81 | Ht 62.0 in | Wt 133.9 lb

## 2024-09-19 DIAGNOSIS — Z0130 Encounter for examination of blood pressure without abnormal findings: Secondary | ICD-10-CM

## 2024-09-19 NOTE — Telephone Encounter (Signed)
 Left message on machine for patient to return our call

## 2024-09-19 NOTE — Progress Notes (Signed)
 Here for repeat BP check after dose changes. BP wnl. Advised continue Procardia  BID as ordered.  C/o mild HA at times relieved with tylenol  or ibuprofen . Discussed busy life and stress with 6 children.  Reviewed symptoms to report. Reviewed ways of decreasing stress including letting family help if possible.  Reviewed postpartum appointment. She voices understanding.  Rock Skip PEAK

## 2024-09-20 ENCOUNTER — Encounter: Payer: Self-pay | Admitting: *Deleted

## 2024-09-20 NOTE — Telephone Encounter (Signed)
 Attempted to reach patient in regards to Dr. Gib response. Left voicemail for patient to return call.

## 2024-09-22 ENCOUNTER — Ambulatory Visit

## 2024-09-29 ENCOUNTER — Ambulatory Visit (INDEPENDENT_AMBULATORY_CARE_PROVIDER_SITE_OTHER): Payer: Self-pay | Admitting: Advanced Practice Midwife

## 2024-09-29 ENCOUNTER — Other Ambulatory Visit: Payer: Self-pay

## 2024-09-29 VITALS — BP 124/85 | HR 108

## 2024-09-29 DIAGNOSIS — Z30011 Encounter for initial prescription of contraceptive pills: Secondary | ICD-10-CM

## 2024-09-29 DIAGNOSIS — O165 Unspecified maternal hypertension, complicating the puerperium: Secondary | ICD-10-CM

## 2024-09-29 DIAGNOSIS — O864 Pyrexia of unknown origin following delivery: Secondary | ICD-10-CM

## 2024-09-29 MED ORDER — NORETHINDRONE 0.35 MG PO TABS: 1.0000 | ORAL_TABLET | Freq: Every day | ORAL | 12 refills | Status: AC

## 2024-09-29 NOTE — Progress Notes (Unsigned)
 Post Partum Visit Note  Courtney [name] is a 33 y.o. [phone] female who presents for a postpartum visit. She is 6.1 weeks postpartum following a normal spontaneous vaginal delivery.  I have fully reviewed the prenatal and intrapartum course. The delivery was at 38.5 gestational weeks.  Anesthesia: epidural. Postpartum course has been okay. Baby is doing well. Baby is feeding by both breast and bottle - Enfamil with Iron. Bleeding no bleeding but period came on. Bowel function is normal. Bladder function is normal. Patient is not sexually active. Contraception method is none. Postpartum depression screening: negative.  Reports that she has separated from her partner. Coping adequately.   Home BP's 140's/100's after increasing Procardia  to 60 QD.   The pregnancy intention screening data noted above was reviewed. Potential methods of contraception were discussed. The patient elected to proceed with Oral Contraceptive.    Edinburgh Postnatal Depression Scale - 09/29/24 1122       Edinburgh Postnatal Depression Scale:  In the Past 7 Days   I have been able to laugh and see the funny side of things. 0    I have looked forward with enjoyment to things. 0    I have blamed myself unnecessarily when things went wrong. 1    I have been anxious or worried for no good reason. 2    I have felt scared or panicky for no good reason. 2    Things have been getting on top of me. 1    I have been so unhappy that I have had difficulty sleeping. 0    I have felt sad or miserable. 0    I have been so unhappy that I have been crying. 0    The thought of harming myself has occurred to me. 0    Edinburgh Postnatal Depression Scale Total 6          Health Maintenance Due  Topic Date Due   Hepatitis B Vaccines 19-59 Average Risk (1 of 3 - 19+ 3-dose series) Never done   HPV VACCINES (1 - 3-dose SCDM series) Never done    The following portions of the patient's history were reviewed and updated as  appropriate: allergies, current medications, past family history, past medical history, past social history, past surgical history, and problem list.  Review of Systems Pertinent items are noted in HPI.  Objective:  BP 124/85   Pulse (!) 108   LMP  (Approximate) Comment: pt states menstrual cycle has not started since after last pregnancy.  Breastfeeding Yes    General:  Alert, cooperative, NAD   Breasts:  not indicated  Lungs: Normal rate and effort  Heart:  Normal rate  Abdomen: Soft, NT, Fundus non-palpable  Wound NA  GU exam:  Declined    Assessment:   1. Oral contraception initial prescription (Primary) - Rx Micronor   2. Postpartum exam  3. Postpartum fever - Resolve w/ augmentin   4. Postpartum hypertension - BP Nml in office today but still mildly elevated at home. Will continue current dose for now in anticipation of it lowering postpartum. Recommend F/U w/ /PCP and increased risk of CHTN w/ Hx GHTN.    Plan:   Essential components of care per ACOG recommendations:  1.  Mood and well being: Patient with negative depression screening today. Reviewed local resources for support.  - Patient tobacco use? No.   - hx of drug use? No.    2. Infant care and feeding:  -Patient currently breastmilk feeding? Yes.  Discussed returning to work and pumping. Reviewed importance of draining breast regularly to support lactation.  -Social determinants of health (SDOH) reviewed in EPIC. No concerns.  3. Sexuality, contraception and birth spacing - Patient does not want a pregnancy in the next year.  Desired family size is 6 children.  - Reviewed reproductive life planning. Reviewed contraceptive methods based on pt preferences and effectiveness.  Patient desired Oral Contraceptive today.   - Discussed birth spacing of 18 months  4. Sleep and fatigue -Encouraged family/partner/community support of 4 hrs of uninterrupted sleep to help with mood and fatigue  5. Physical  Recovery  - Discussed patients delivery and complications. She describes her labor as good. - Patient had a Vaginal, no problems at delivery. Patient had no laceration. Perineal healing reviewed. Patient expressed understanding - Patient has urinary incontinence? No. - Patient is safe to resume physical and sexual activity  6.  Health Maintenance - HM due items addressed --NA - Last pap smear  Diagnosis  Date Value Ref Range Status  03/30/2022   Final   - Negative for intraepithelial lesion or malignancy (NILM)   Pap smear not done at today's visit.  -Breast Cancer screening indicated? No.   7. Chronic Disease/Pregnancy Condition follow up: Hypertension and Hyperthyroidism  - PCP follow up  Courtney [name]  Courtney [name], CNM [facility] [employer], [facility]

## 2024-10-02 ENCOUNTER — Encounter: Payer: Self-pay | Admitting: *Deleted

## 2024-11-13 ENCOUNTER — Ambulatory Visit: Admitting: Internal Medicine
